# Patient Record
Sex: Female | Born: 1965 | Race: White | Hispanic: No | Marital: Single | Smoking: Current every day smoker
Health system: Southern US, Community
[De-identification: ages and names within clinical notes are randomized; demographics above are authoritative.]

## PROBLEM LIST (undated history)

## (undated) DIAGNOSIS — K219 Gastro-esophageal reflux disease without esophagitis: Secondary | ICD-10-CM

## (undated) DIAGNOSIS — R519 Headache, unspecified: Secondary | ICD-10-CM

## (undated) DIAGNOSIS — E669 Obesity, unspecified: Secondary | ICD-10-CM

## (undated) DIAGNOSIS — I2510 Atherosclerotic heart disease of native coronary artery without angina pectoris: Secondary | ICD-10-CM

## (undated) DIAGNOSIS — I10 Essential (primary) hypertension: Secondary | ICD-10-CM

## (undated) DIAGNOSIS — K2270 Barrett's esophagus without dysplasia: Secondary | ICD-10-CM

## (undated) DIAGNOSIS — R51 Headache: Secondary | ICD-10-CM

## (undated) HISTORY — DX: Obesity, unspecified: E66.9

## (undated) HISTORY — PX: COLONOSCOPY: SHX174

## (undated) HISTORY — PX: ATHERECTOMY: SHX47

## (undated) HISTORY — DX: Barrett's esophagus without dysplasia: K22.70

## (undated) HISTORY — PX: CARPAL TUNNEL RELEASE: SHX101

## (undated) HISTORY — PX: ESOPHAGOGASTRODUODENOSCOPY: SHX1529

## (undated) HISTORY — PX: TUBAL LIGATION: SHX77

## (undated) HISTORY — DX: Atherosclerotic heart disease of native coronary artery without angina pectoris: I25.10

---

## 2003-02-27 ENCOUNTER — Other Ambulatory Visit: Payer: Self-pay

## 2003-12-08 ENCOUNTER — Other Ambulatory Visit: Payer: Self-pay

## 2004-05-15 ENCOUNTER — Ambulatory Visit: Payer: Self-pay

## 2004-06-17 ENCOUNTER — Ambulatory Visit: Payer: Self-pay

## 2004-06-18 ENCOUNTER — Ambulatory Visit: Payer: Self-pay

## 2004-06-19 ENCOUNTER — Ambulatory Visit: Payer: Self-pay

## 2004-06-30 ENCOUNTER — Ambulatory Visit: Payer: Self-pay | Admitting: Specialist

## 2004-07-02 ENCOUNTER — Ambulatory Visit: Payer: Self-pay

## 2004-08-06 ENCOUNTER — Ambulatory Visit: Payer: Self-pay

## 2004-08-12 ENCOUNTER — Ambulatory Visit: Payer: Self-pay

## 2004-10-31 ENCOUNTER — Other Ambulatory Visit: Payer: Self-pay

## 2004-10-31 ENCOUNTER — Emergency Department: Payer: Self-pay | Admitting: Emergency Medicine

## 2004-12-29 ENCOUNTER — Ambulatory Visit: Payer: Self-pay

## 2004-12-30 ENCOUNTER — Ambulatory Visit: Payer: Self-pay | Admitting: Unknown Physician Specialty

## 2005-01-06 ENCOUNTER — Emergency Department: Payer: Self-pay | Admitting: Emergency Medicine

## 2005-01-13 ENCOUNTER — Ambulatory Visit: Payer: Self-pay | Admitting: Internal Medicine

## 2005-06-25 ENCOUNTER — Ambulatory Visit: Payer: Self-pay | Admitting: Neurology

## 2005-09-24 ENCOUNTER — Ambulatory Visit: Payer: Self-pay | Admitting: Unknown Physician Specialty

## 2006-02-03 ENCOUNTER — Ambulatory Visit: Payer: Self-pay | Admitting: Family Medicine

## 2006-03-09 ENCOUNTER — Ambulatory Visit: Payer: Self-pay | Admitting: Gastroenterology

## 2006-03-25 ENCOUNTER — Ambulatory Visit: Payer: Self-pay | Admitting: Gastroenterology

## 2007-04-12 ENCOUNTER — Ambulatory Visit: Payer: Self-pay | Admitting: Gastroenterology

## 2007-04-19 ENCOUNTER — Ambulatory Visit: Payer: Self-pay | Admitting: Family Medicine

## 2007-06-02 ENCOUNTER — Ambulatory Visit: Payer: Self-pay | Admitting: Family Medicine

## 2008-08-06 ENCOUNTER — Ambulatory Visit: Payer: Self-pay | Admitting: Family Medicine

## 2008-09-18 ENCOUNTER — Ambulatory Visit: Payer: Self-pay | Admitting: Nephrology

## 2009-05-15 ENCOUNTER — Ambulatory Visit: Payer: Self-pay | Admitting: Gastroenterology

## 2009-06-18 ENCOUNTER — Ambulatory Visit: Payer: Self-pay | Admitting: Family Medicine

## 2010-09-20 ENCOUNTER — Emergency Department: Payer: Self-pay | Admitting: Emergency Medicine

## 2010-09-26 ENCOUNTER — Ambulatory Visit: Payer: Self-pay | Admitting: Internal Medicine

## 2011-06-03 ENCOUNTER — Ambulatory Visit: Payer: Self-pay | Admitting: Neurology

## 2011-09-02 ENCOUNTER — Emergency Department: Payer: Self-pay | Admitting: *Deleted

## 2011-09-07 ENCOUNTER — Observation Stay: Payer: Self-pay | Admitting: Internal Medicine

## 2011-09-07 LAB — URINALYSIS, COMPLETE
Bacteria: NONE SEEN
Bilirubin,UR: NEGATIVE
Blood: NEGATIVE
Ketone: NEGATIVE
Leukocyte Esterase: NEGATIVE
Nitrite: NEGATIVE
Protein: NEGATIVE
RBC,UR: <1 /HPF (ref 0–5)
Specific Gravity: 1.019 (ref 1.003–1.030)
Squamous Epithelial: 1

## 2011-09-07 LAB — BASIC METABOLIC PANEL
Anion Gap: 7 (ref 7–16)
Calcium, Total: 8.2 mg/dL — ABNORMAL LOW (ref 8.5–10.1)
Chloride: 113 mmol/L — ABNORMAL HIGH (ref 98–107)
Co2: 23 mmol/L (ref 21–32)
EGFR (African American): >60
EGFR (Non-African Amer.): >60
Sodium: 143 mmol/L (ref 136–145)

## 2011-09-07 LAB — CBC
HGB: 14.5 g/dL (ref 12.0–16.0)
Platelet: 270 10*3/uL (ref 150–440)
RBC: 4.51 10*6/uL (ref 3.80–5.20)

## 2011-09-07 LAB — TROPONIN I: Troponin-I: <0.02 ng/mL

## 2011-09-08 LAB — LIPID PANEL
Cholesterol: 204 mg/dL — ABNORMAL HIGH (ref 0–200)
HDL Cholesterol: 23 mg/dL — ABNORMAL LOW (ref 40–60)

## 2011-10-01 ENCOUNTER — Emergency Department: Payer: Self-pay | Admitting: Emergency Medicine

## 2011-10-01 LAB — PROTIME-INR: Prothrombin Time: 12.9 secs (ref 11.5–14.7)

## 2011-10-01 LAB — URINALYSIS, COMPLETE
Bilirubin,UR: NEGATIVE
Blood: NEGATIVE
Glucose,UR: NEGATIVE mg/dL (ref 0–75)
Leukocyte Esterase: NEGATIVE
Ph: 6 (ref 4.5–8.0)
RBC,UR: NONE SEEN /HPF (ref 0–5)

## 2011-10-01 LAB — CBC
MCH: 31.5 pg (ref 26.0–34.0)
MCHC: 33.2 g/dL (ref 32.0–36.0)
MCV: 95 fL (ref 80–100)
Platelet: 357 10*3/uL (ref 150–440)

## 2011-10-01 LAB — COMPREHENSIVE METABOLIC PANEL
Albumin: 3.5 g/dL (ref 3.4–5.0)
Anion Gap: 11 (ref 7–16)
Calcium, Total: 8.6 mg/dL (ref 8.5–10.1)
Co2: 17 mmol/L — ABNORMAL LOW (ref 21–32)
Creatinine: 0.85 mg/dL (ref 0.60–1.30)
EGFR (Non-African Amer.): >60
Glucose: 87 mg/dL (ref 65–99)
Osmolality: 284 (ref 275–301)
Potassium: 3.9 mmol/L (ref 3.5–5.1)
SGOT(AST): 20 U/L (ref 15–37)
Sodium: 141 mmol/L (ref 136–145)

## 2011-10-01 LAB — APTT: Activated PTT: 29.7 secs (ref 23.6–35.9)

## 2011-10-01 LAB — TROPONIN I: Troponin-I: <0.02 ng/mL

## 2011-10-07 ENCOUNTER — Ambulatory Visit: Payer: Self-pay | Admitting: Neurology

## 2014-02-24 ENCOUNTER — Emergency Department: Payer: Self-pay | Admitting: Emergency Medicine

## 2014-02-24 LAB — COMPREHENSIVE METABOLIC PANEL
ALK PHOS: 72 U/L
Albumin: 3.2 g/dL — ABNORMAL LOW (ref 3.4–5.0)
Anion Gap: 4 — ABNORMAL LOW (ref 7–16)
BUN: 13 mg/dL (ref 7–18)
Bilirubin,Total: 0.1 mg/dL — ABNORMAL LOW (ref 0.2–1.0)
Calcium, Total: 7.7 mg/dL — ABNORMAL LOW (ref 8.5–10.1)
Chloride: 113 mmol/L — ABNORMAL HIGH (ref 98–107)
Co2: 24 mmol/L (ref 21–32)
Creatinine: 0.89 mg/dL (ref 0.60–1.30)
EGFR (Non-African Amer.): >60
GLUCOSE: 86 mg/dL (ref 65–99)
Osmolality: 281 (ref 275–301)
POTASSIUM: 3.9 mmol/L (ref 3.5–5.1)
SGOT(AST): 26 U/L (ref 15–37)
SGPT (ALT): 30 U/L
Sodium: 141 mmol/L (ref 136–145)
TOTAL PROTEIN: 6.4 g/dL (ref 6.4–8.2)

## 2014-02-24 LAB — CBC WITH DIFFERENTIAL/PLATELET
Basophil #: 0.1 10*3/uL (ref 0.0–0.1)
Basophil %: 1.2 %
Eosinophil #: 0.2 10*3/uL (ref 0.0–0.7)
Eosinophil %: 2 %
HCT: 41.9 % (ref 35.0–47.0)
HGB: 14.1 g/dL (ref 12.0–16.0)
Lymphocyte #: 3.8 10*3/uL — ABNORMAL HIGH (ref 1.0–3.6)
Lymphocyte %: 42.9 %
MCH: 31.5 pg (ref 26.0–34.0)
MCHC: 33.6 g/dL (ref 32.0–36.0)
MCV: 94 fL (ref 80–100)
MONO ABS: 0.8 x10 3/mm (ref 0.2–0.9)
Monocyte %: 8.6 %
Neutrophil #: 4 10*3/uL (ref 1.4–6.5)
Neutrophil %: 45.3 %
Platelet: 298 10*3/uL (ref 150–440)
RBC: 4.47 10*6/uL (ref 3.80–5.20)
RDW: 13.4 % (ref 11.5–14.5)
WBC: 8.8 10*3/uL (ref 3.6–11.0)

## 2014-02-24 LAB — TROPONIN I

## 2014-02-24 LAB — LIPASE, BLOOD: Lipase: 159 U/L (ref 73–393)

## 2014-02-25 LAB — URINALYSIS, COMPLETE
Bacteria: NONE SEEN
Bilirubin,UR: NEGATIVE
Blood: NEGATIVE
GLUCOSE, UR: NEGATIVE mg/dL (ref 0–75)
Ketone: NEGATIVE
Leukocyte Esterase: NEGATIVE
Nitrite: NEGATIVE
PH: 5 (ref 4.5–8.0)
Protein: NEGATIVE
SPECIFIC GRAVITY: 1.024 (ref 1.003–1.030)
Squamous Epithelial: 2
WBC UR: 2 /HPF (ref 0–5)

## 2014-04-08 ENCOUNTER — Emergency Department: Payer: Self-pay | Admitting: Emergency Medicine

## 2014-08-12 NOTE — Consult Note (Signed)
PATIENT NAME:  Robyn Anderson, Robyn Anderson MR#:  [id number] DATE OF BIRTH:  [birth year]  DATE OF CONSULTATION:  09/08/2011  REFERRING PHYSICIAN:  Dr. Demetrios Loll  CONSULTING PHYSICIAN:  Ninetta Adelstein K. Manuella Ghazi, MD  REASON FOR CONSULTATION: Episode of tremor, numbness, rule out stroke.   HISTORY OF PRESENT ILLNESS: Ms. Broxton is a 49 year old right-handed Caucasian female outpatient of mine with migraine headaches with recent status migrainosus.   She was having very severe headaches and I saw her as an outpatient, got started on topiramate.   She took around four pills and was feeling "bad", was having numbness around her mouth and on her fingers. The third morning when she woke up she noticed that her left hand was shaking, she could not come up with words and called 911, came to the ER and was occasionally having left hand tremors.   Her son has intractable epilepsy and they were worried about seizures.   She had mild elevation of her blood pressure of 139/95 per patient and she was worried about stroke.   Since then being off of medication her symptoms have resolved. Now she is not having any speech problems. She feels like her headache also improved.   Still not normal yet. She is still having some tingling and numbness in her fingers and around her mouth. She never lost consciousness during this period. She does not feel like she had a seizure.   PAST MEDICAL HISTORY:  1. Asthma. 2. Kidney stones.  3. Carpal tunnel. 4. Migraines.   PAST SURGICAL HISTORY:  1. Kidney surgery.  2. Tubal ligation.  3. Two C-sections.   SOCIAL HISTORY: Significant that she smokes a half pack per day since age of 61. She does not drink alcohol. Does not do recreational drugs. She works as a [employer]arry manager. Has a college education. She never had DUI. Does not do recreational drugs.   FAMILY HISTORY: Significant that sister has MS. Father has high blood pressure and elevated PSA. Mother has cancer. One brother has high blood  pressure and kidney stone. Son has intractable epilepsy. Daughter has angina.   REVIEW OF SYSTEMS: Positive for headache, tingling and numbness around her fingers and around her mouth, recent episode of tremors, recent episode of status migrainosus. Recent episode of sensitivity to light and noise but now she is feeling better. She also feels like her mood is significantly better. Other 10 system review of system was asked and was found to be negative.   PHYSICAL EXAMINATION:  VITAL SIGNS: Temperature 98.5, pulse 75, respiratory rate 18, blood pressure 104/64, pulse oximetry 100% on room air.   GENERAL: She is a middle-aged, Caucasian female lying in the bed here by herself.   LUNGS: Clear to auscultation.   HEART: S1, S2 heart sounds. Carotid exam did not reveal any bruit.   Her funduscopic exam was unremarkable.    MENTAL STATUS: She was alert, oriented, could follow two-step inverted commands. Her face was symmetric. Tongue was midline. Facial sensations were intact. Her hearing was intact. Her shoulder shrug was normal.   On her motor exam she had normal tone and strength of 5/5 in all extremities.   Her sensory exam was intact to light touch. Her coordination was symmetric. Her reflexes were symmetric. Her gait was not checked.   LABORATORY, DIAGNOSTIC AND RADIOLOGICAL DATA: Her brain scan looked okay.   ASSESSMENT AND PLAN: Acute episode of left hand tremors, confusion, tingling and numbness soon after starting topiramate. Can be potentially side effect  with word finding difficulty, etc., but tremor will be very unusual being topiramate side effect. I cannot rule out behavior spell as she is going through rough time with severe of migraines.   For now I will hold off on starting any other new prophylactic medication as she recently had this problem with topiramate.   She can use muscle relaxant medication as rescue. She can also use NSAIDs but patient is aware of not going through  medication rebound headache.   Patient was explained on other medication side effects. I will hold off on giving her triptan at present as well.   I will see her back in the clinic as scheduled but she will be more than welcome to come and see me earlier if she has any further issues. Please feel free to contact me with any further questions.  ____________________________ Royetta Crochet. Manuella Ghazi, MD hks:cms D: 09/08/2011 19:46:36 ET T: 09/09/2011 09:35:06 ET JOB#: [id number]  cc: Dev Dhondt K. Manuella Ghazi, MD, <Dictator> Royetta Crochet [facility] MD ELECTRONICALLY SIGNED 09/11/2011 8:05

## 2014-08-12 NOTE — H&P (Signed)
PATIENT NAME:  Robyn Anderson, Robyn Anderson MR#:  [id number] DATE OF BIRTH:  October 04, 1965  DATE OF ADMISSION:  09/07/2011  CHIEF COMPLAINT: Left hand numbness, tremor, and shaking in addition to bilateral extremity tingling for three days, worsening today.   HISTORY OF PRESENT ILLNESS: The patient is a 49 year old Caucasian female with a history of migraine, asthma, and Barrett's esophagus who presented to the ED with the above chief complaint. The patient is alert, awake, oriented in no acute distress. She complains of left hand numbness, tremor, and shaking in bilateral extremities, tingling for about three days with the symptoms worsening since this morning so she came to the ED for further evaluation. According to the patient, she also has slurred speech but according to her daughter slurred speech is the patient's baseline. The patient denies any headache, dizziness, or weakness. No incontinence. The patient said she was started on Topamax recently by Dr. Manuella Ghazi. According to ED physician, Dr. Reita Cliche, she discussed with Dr. Manuella Ghazi. Dr. Manuella Ghazi suggested that it is not side effect for Topamax but the patient needs to be admitted for further work-up.   SOCIAL HISTORY: The patient smokes half a pack a day since 49 years old. Denies any alcohol drinking or illicit drugs.   FAMILY HISTORY: Mother had colon cancer, hypertension, and diabetes. Stroke runs in her family.   PAST SURGICAL HISTORY: Cesarean section twice.   ALLERGIES: Aspirin and Vicodin.   HOME MEDICATIONS:  1. Advair 100/50 one puff b.i.d.  2. Combivent 1 puff 4 times daily p.r.n.  3. Topamax 25 mg p.o. twice daily.  4. Zyrtec once daily.   REVIEW OF SYSTEMS: CONSTITUTIONAL: The patient denies any fever or chills. No headache, dizziness, or weight loss. HEENT: No double vision or blurred vision. No dysphagia but has slurred speech. No postnasal drip or epistaxis. RESPIRATORY: No cough, sputum, shortness of breath, or hemoptysis. GI: No abdominal pain,  nausea, vomiting, or diarrhea. No melena. CARDIOVASCULAR: No chest pain, palpitation, orthopnea, or nocturnal dyspnea. No leg edema. GU: No dysuria, hematuria, or incontinence. SKIN: No rash or jaundice. ENDOCRINE: No polydipsia, polyuria, or nocturia. HEMATOLOGY: No easy bruising or bleeding. NEUROLOGY: Positive for numbness, tingling, and tremor of extremities but no loss of consciousness, seizure, or syncope.  PHYSICAL EXAMINATION:  VITAL SIGNS: Temperature 97.9, blood pressure 139/59, pulse 79, respirations 18, oxygen saturation 100% on room air.   GENERAL: The patient is alert, awake, oriented in no acute distress.   HEENT: Pupils are round, equal, and reactive to light and accommodation. Moist oral mucosa. Clear oropharynx.   NECK: Supple. No JVD or carotid bruit. No lymphadenopathy.   CARDIOVASCULAR: S1, S2 regular rate and rhythm. No murmurs or gallops.   PULMONARY: Bilateral air entry. No wheezing or rales.   ABDOMEN: Soft. No distention or tenderness. No organomegaly. Bowel sounds are present.   EXTREMITIES: No edema, clubbing, or cyanosis. No calf tenderness. Bilateral strong pedal pulses.   NEUROLOGY: Alert and oriented x3. No focal deficits. Has mild slurred speech. Power 5 out of 5. Sensation intact. Deep tendon reflexes 2+.   LABORATORY, DIAGNOSTIC, AND RADIOLOGICAL DATA: CBC normal glucose of 85, BUN 12, creatinine 0.97, potassium 4.1, sodium 143, chloride 113, bicarb 23.   CAT scan of head no acute intracranial abnormality.   EKG showed normal sinus rhythm at 74 beats per minute.    IMPRESSION:  1. Extremity numbness, tingling, and tremor, possibly due to side effect of medicine, Topamax or Zyrtec but need to rule out cerebrovascular accident.  2.  Migraine.  3. Asthma.  4. Barrett's esophagus.   PLAN OF TREATMENT:  1. The patient will be placed for observation.  2. Will continue telemonitor.  3. Get MRI of the brain and echocardiograph of carotid duplex.   4. Will check lipid panel and start Lipitor. 5. Since the patient has allergy to aspirin, will give Plavix but discontinue Topamax and Zyrtec.   6. Asthma. Continue Advair and Combivent p.r.n.  7. GI and DVT prophylaxis.   Discussed the patient's situation and the plan of work-up and treatment with the patient and the patient's son and daughter.   TIME SPENT: About 60 minutes.  ____________________________ Demetrios Loll, MD qc:drc D: 09/07/2011 14:09:09 ET T: 09/07/2011 14:32:58 ET JOB#: [id number]  cc: Demetrios Loll, MD, <Dictator> Demetrios Loll MD ELECTRONICALLY SIGNED 09/08/2011 13:39

## 2015-09-24 ENCOUNTER — Encounter: Payer: Self-pay | Admitting: *Deleted

## 2015-09-27 ENCOUNTER — Ambulatory Visit
Admission: RE | Admit: 2015-09-27 | Discharge: 2015-09-27 | Disposition: A | Discharge status: Home / Self Care | Payer: 59 | Source: Ambulatory Visit | Attending: Unknown Physician Specialty | Admitting: Unknown Physician Specialty

## 2015-09-27 ENCOUNTER — Ambulatory Visit: Payer: 59 | Admitting: Anesthesiology

## 2015-09-27 ENCOUNTER — Encounter
Admission: RE | Disposition: A | Discharge status: Home / Self Care | Payer: Self-pay | Source: Ambulatory Visit | Attending: Unknown Physician Specialty

## 2015-09-27 DIAGNOSIS — Z888 Allergy status to other drugs, medicaments and biological substances status: Secondary | ICD-10-CM | POA: Diagnosis not present

## 2015-09-27 DIAGNOSIS — Z800 Family history of malignant neoplasm of digestive organs: Secondary | ICD-10-CM | POA: Insufficient documentation

## 2015-09-27 DIAGNOSIS — F17200 Nicotine dependence, unspecified, uncomplicated: Secondary | ICD-10-CM | POA: Diagnosis not present

## 2015-09-27 DIAGNOSIS — Z841 Family history of disorders of kidney and ureter: Secondary | ICD-10-CM | POA: Diagnosis not present

## 2015-09-27 DIAGNOSIS — Z885 Allergy status to narcotic agent status: Secondary | ICD-10-CM | POA: Insufficient documentation

## 2015-09-27 DIAGNOSIS — G43909 Migraine, unspecified, not intractable, without status migrainosus: Secondary | ICD-10-CM | POA: Insufficient documentation

## 2015-09-27 DIAGNOSIS — Z820 Family history of epilepsy and other diseases of the nervous system: Secondary | ICD-10-CM | POA: Diagnosis not present

## 2015-09-27 DIAGNOSIS — Z79899 Other long term (current) drug therapy: Secondary | ICD-10-CM | POA: Diagnosis not present

## 2015-09-27 DIAGNOSIS — G5601 Carpal tunnel syndrome, right upper limb: Secondary | ICD-10-CM | POA: Diagnosis present

## 2015-09-27 DIAGNOSIS — Z8042 Family history of malignant neoplasm of prostate: Secondary | ICD-10-CM | POA: Insufficient documentation

## 2015-09-27 DIAGNOSIS — Z8249 Family history of ischemic heart disease and other diseases of the circulatory system: Secondary | ICD-10-CM | POA: Diagnosis not present

## 2015-09-27 HISTORY — DX: Headache: R51

## 2015-09-27 HISTORY — DX: Gastro-esophageal reflux disease without esophagitis: K21.9

## 2015-09-27 HISTORY — DX: Essential (primary) hypertension: I10

## 2015-09-27 HISTORY — DX: Headache, unspecified: R51.9

## 2015-09-27 HISTORY — PX: CARPAL TUNNEL RELEASE: SHX101

## 2015-09-27 SURGERY — CARPAL TUNNEL RELEASE
Anesthesia: General | Site: Hand | Laterality: Right | Wound class: Clean

## 2015-09-27 MED ORDER — ONDANSETRON HCL 4 MG/2ML IJ SOLN
INTRAMUSCULAR | Status: DC | PRN
  Administered 2015-09-27: 4 mg via INTRAVENOUS

## 2015-09-27 MED ORDER — PROPOFOL 10 MG/ML IV BOLUS
INTRAVENOUS | Status: DC | PRN
  Administered 2015-09-27: 140 mg via INTRAVENOUS

## 2015-09-27 MED ORDER — LIDOCAINE HCL (CARDIAC) 20 MG/ML IV SOLN
INTRAVENOUS | Status: DC | PRN
  Administered 2015-09-27: 40 mg via INTRATRACHEAL

## 2015-09-27 MED ORDER — BUPIVACAINE HCL (PF) 0.5 % IJ SOLN
INTRAMUSCULAR | Status: DC | PRN
  Administered 2015-09-27: 7 mL

## 2015-09-27 MED ORDER — LACTATED RINGERS IV SOLN
INTRAVENOUS | Status: DC
  Administered 2015-09-27: 09:00:00 via INTRAVENOUS

## 2015-09-27 MED ORDER — KETOROLAC TROMETHAMINE 30 MG/ML IJ SOLN
INTRAMUSCULAR | Status: DC | PRN
  Administered 2015-09-27: 30 mg via INTRAVENOUS

## 2015-09-27 MED ORDER — GLYCOPYRROLATE 0.2 MG/ML IJ SOLN
INTRAMUSCULAR | Status: DC | PRN
  Administered 2015-09-27: 0.2 mg via INTRAVENOUS

## 2015-09-27 MED ORDER — MIDAZOLAM HCL 5 MG/5ML IJ SOLN
INTRAMUSCULAR | Status: DC | PRN
  Administered 2015-09-27: 2 mg via INTRAVENOUS

## 2015-09-27 MED ORDER — DEXAMETHASONE SODIUM PHOSPHATE 4 MG/ML IJ SOLN
INTRAMUSCULAR | Status: DC | PRN
  Administered 2015-09-27: 4 mg via INTRAVENOUS

## 2015-09-27 MED ORDER — FENTANYL CITRATE (PF) 100 MCG/2ML IJ SOLN
INTRAMUSCULAR | Status: DC | PRN
  Administered 2015-09-27 (×2): 50 ug via INTRAVENOUS

## 2015-09-27 SURGICAL SUPPLY — 28 items
BANDAGE ELASTIC 2 CLIP NS LF (GAUZE/BANDAGES/DRESSINGS) ×3 IMPLANT
BNDG ESMARK 4X12 TAN STRL LF (GAUZE/BANDAGES/DRESSINGS) ×3 IMPLANT
BNDG STRETCH 4X75 STRL LF (GAUZE/BANDAGES/DRESSINGS) ×3 IMPLANT
COVER LIGHT HANDLE FLEXIBLE (MISCELLANEOUS) ×3 IMPLANT
CUFF TOURN SGL QUICK 18 (TOURNIQUET CUFF) ×3 IMPLANT
DURAPREP 26ML APPLICATOR (WOUND CARE) ×3 IMPLANT
GAUZE SPONGE 4X4 12PLY STRL (GAUZE/BANDAGES/DRESSINGS) ×3 IMPLANT
GLOVE BIO SURGEON STRL SZ7.5 (GLOVE) ×3 IMPLANT
GLOVE BIO SURGEON STRL SZ8 (GLOVE) ×6 IMPLANT
GLOVE INDICATOR 8.0 STRL GRN (GLOVE) ×3 IMPLANT
GOWN STRL REUS W/ TWL LRG LVL3 (GOWN DISPOSABLE) ×2 IMPLANT
GOWN STRL REUS W/TWL LRG LVL3 (GOWN DISPOSABLE) ×4
KIT ROOM TURNOVER OR (KITS) ×3 IMPLANT
LOOP VESSEL RED MINI 1.3X0.9 (MISCELLANEOUS) ×1 IMPLANT
LOOPS RED MINI 1.3MMX0.9MM (MISCELLANEOUS) ×2
NS IRRIG 500ML POUR BTL (IV SOLUTION) ×3 IMPLANT
PACK EXTREMITY ARMC (MISCELLANEOUS) ×3 IMPLANT
PAD GROUND ADULT SPLIT (MISCELLANEOUS) ×3 IMPLANT
PADDING CAST 2X4YD ST (MISCELLANEOUS) ×2
PADDING CAST BLEND 2X4 STRL (MISCELLANEOUS) ×1 IMPLANT
SOL PREP PVP 2OZ (MISCELLANEOUS) ×3
SOLUTION PREP PVP 2OZ (MISCELLANEOUS) ×1 IMPLANT
SPLINT CAST 1 STEP 3X12 (MISCELLANEOUS) ×3 IMPLANT
STOCKINETTE 4X48 STRL (DRAPES) ×3 IMPLANT
STRAP BODY AND KNEE 60X3 (MISCELLANEOUS) ×3 IMPLANT
SUT ETHILON 4-0 (SUTURE) ×2
SUT ETHILON 4-0 FS2 18XMFL BLK (SUTURE) ×1
SUTURE ETHLN 4-0 FS2 18XMF BLK (SUTURE) ×1 IMPLANT

## 2015-09-27 NOTE — Anesthesia Procedure Notes (Signed)
Procedure Name: LMA Insertion Performed by: Georga Bora Pre-anesthesia Checklist: Patient identified, Emergency Drugs available, Suction available, Timeout performed and Patient being monitored Patient Re-evaluated:Patient Re-evaluated prior to inductionOxygen Delivery Method: Circle system utilized Preoxygenation: Pre-oxygenation with 100% oxygen Intubation Type: IV induction LMA: LMA inserted LMA Size: 4.0 Number of attempts: 1 Placement Confirmation: positive ETCO2 and breath sounds checked- equal and bilateral Tube secured with: Tape

## 2015-09-27 NOTE — Op Note (Signed)
DATE OF SURGERY:  09/27/2015  PATIENT NAME:  Robyn Anderson   DOB: December 24, 1965  MRN: [id number]  PRE-OPERATIVE DIAGNOSIS: Right carpal tunnel syndrome  POST-OPERATIVE DIAGNOSIS:  Same  PROCEDURE: Right carpal tunnel release  SURGEON: Dr. Leanor Kail, Brooke Bonito. M.D.  ANESTHESIA: Gen.   INDICATIONS FOR SURGERY: Robyn Anderson is a 50 y.o. year old female with a long history of numbness and paresthesias in the right hand. Nerve conduction studies demonstrated findings consistent with moderately severe  median nerve compression.The patient had not seen any significant improvement despite conservative nonsurgical intervention. After discussion of the risks and benefits of surgical intervention, the patient expressed understanding of the risks benefits and agreed with plans for carpal tunnel release.   PROCEDURE IN DETAIL: The patient was taken the operating room where satisfactory general anesthesia was achieved. A tourniquet was placed on the patient's right upper arm.The right hand and arm were prepped  and draped in the usual sterile fashion. A "time-out" was performed as per usual protocol. The hand and forearm were exsanguinated using an Esmarch and the tourniquet was inflated to 250 mmHg.  An incision was made just ulnar to the thenar palmar crease. Dissection was carried down through the palmar fascia to the transverse carpal ligament. The transverse carpal ligament was sharply incised, taking care to protect the underlying structures within the carpal tunnel. Complete release of the transverse carpal ligament was achieved. There was no evidence of a mass or proliferative synovitis within the carpal tunnel. The median nerve underneath the divided ligament was somewhat flattened. The wound was irrigated with saline. The tourniquet was released at this time. It had been up for about 12 minutes. Bleeding was controlled with digital pressure and coagulation cautery. I did inject the subcutaneous tissue of  the wound with about 5 cc of 0.5% Marcaine without epinephrine. The skin was then re-approximated with interrupted sutures of #4-0 nylon. A sterile dressing was applied followed by application of a volar splint.  The patient was awakened and transferred to her stretcher bed.  The patient tolerated the procedure well and was transported to the PACU in stable condition. Blood loss was negligible.  Dr. Mariel Kansky. M.D.

## 2015-09-27 NOTE — Anesthesia Postprocedure Evaluation (Signed)
Anesthesia Post Note  Patient: Robyn Anderson  Procedure(s) Performed: Procedure(s) (LRB): OPEN CARPAL TUNNEL RELEASE RIGHT HAND (Right)  Patient location during evaluation: PACU Anesthesia Type: General Level of consciousness: awake and alert Pain management: pain level controlled Vital Signs Assessment: post-procedure vital signs reviewed and stable Respiratory status: spontaneous breathing and respiratory function stable Cardiovascular status: stable Postop Assessment: no headache Anesthetic complications: no    Jaci Standard, III,  Banks Chaikin D

## 2015-09-27 NOTE — Transfer of Care (Signed)
Immediate Anesthesia Transfer of Care Note  Patient: Robyn Anderson  Procedure(s) Performed: Procedure(s): OPEN CARPAL TUNNEL RELEASE RIGHT HAND (Right)  Patient Location: PACU  Anesthesia Type: General LMA  Level of Consciousness: awake, alert  and patient cooperative  Airway and Oxygen Therapy: Patient Spontanous Breathing and Patient connected to supplemental oxygen  Post-op Assessment: Post-op Vital signs reviewed, Patient's Cardiovascular Status Stable, Respiratory Function Stable, Patent Airway and No signs of Nausea or vomiting  Post-op Vital Signs: Reviewed and stable  Complications: No apparent anesthesia complications

## 2015-09-27 NOTE — Anesthesia Preprocedure Evaluation (Signed)
Anesthesia Evaluation  Patient identified by MRN, date of birth, ID band Patient awake    Reviewed: Allergy & Precautions, H&P , NPO status , Patient's Chart, lab work & pertinent test results  Airway Mallampati: II   Neck ROM: full  Mouth opening: Limited Mouth Opening  Dental no notable dental hx.    Pulmonary Current Smoker,    Pulmonary exam normal        Cardiovascular hypertension, On Medications Normal cardiovascular exam     Neuro/Psych    GI/Hepatic Neg liver ROS, Medicated,  Endo/Other  negative endocrine ROS  Renal/GU negative Renal ROS     Musculoskeletal   Abdominal   Peds  Hematology negative hematology ROS (+)   Anesthesia Other Findings   Reproductive/Obstetrics                             Anesthesia Physical Anesthesia Plan  ASA: II  Anesthesia Plan: General LMA   Post-op Pain Management:    Induction:   Airway Management Planned:   Additional Equipment:   Intra-op Plan:   Post-operative Plan:   Informed Consent: I have reviewed the patients History and Physical, chart, labs and discussed the procedure including the risks, benefits and alternatives for the proposed anesthesia with the patient or authorized representative who has indicated his/her understanding and acceptance.     Plan Discussed with: CRNA  Anesthesia Plan Comments:         Anesthesia Quick Evaluation

## 2015-09-27 NOTE — Discharge Instructions (Signed)
General Anesthesia, Adult, Care After Refer to this sheet in the next few weeks. These instructions provide you with information on caring for yourself after your procedure. Your health care provider may also give you more specific instructions. Your treatment has been planned according to current medical practices, but problems sometimes occur. Call your health care provider if you have any problems or questions after your procedure. WHAT TO EXPECT AFTER THE PROCEDURE After the procedure, it is typical to experience:  Sleepiness.  Nausea and vomiting. HOME CARE INSTRUCTIONS  For the first 24 hours after general anesthesia:  Have a responsible person with you.  Do not drive a car. If you are alone, do not take public transportation.  Do not drink alcohol.  Do not take medicine that has not been prescribed by your health care provider.  Do not sign important papers or make important decisions.  You may resume a normal diet and activities as directed by your health care provider.  Change bandages (dressings) as directed.  If you have questions or problems that seem related to general anesthesia, call the hospital and ask for the anesthetist or anesthesiologist on call. SEEK MEDICAL CARE IF:  You have nausea and vomiting that continue the day after anesthesia.  You develop a rash. SEEK IMMEDIATE MEDICAL CARE IF:   You have difficulty breathing.  You have chest pain.  You have any allergic problems.   This information is not intended to replace advice given to you by your health care provider. Make sure you discuss any questions you have with your health care provider.   Document Released: 07/13/2000 Document Revised: 04/27/2014 Document Reviewed: 08/05/2011 Elsevier Interactive Patient Education 2016 [employer]   Elevation  Ice pack  RTC in about 10 days  Keep dressing dry

## 2015-09-27 NOTE — H&P (Signed)
  H and P reviewed. No changes. Uploaded at later date. 

## 2015-09-30 ENCOUNTER — Encounter: Payer: Self-pay | Admitting: Unknown Physician Specialty

## 2016-09-07 ENCOUNTER — Emergency Department (HOSPITAL_COMMUNITY)
Admission: EM | Admit: 2016-09-07 | Discharge: 2016-09-07 | Disposition: A | Discharge status: Home / Self Care | Payer: 59 | Attending: Emergency Medicine | Admitting: Emergency Medicine

## 2016-09-07 ENCOUNTER — Emergency Department (HOSPITAL_COMMUNITY): Payer: 59

## 2016-09-07 ENCOUNTER — Encounter (HOSPITAL_COMMUNITY): Payer: Self-pay | Admitting: Emergency Medicine

## 2016-09-07 DIAGNOSIS — Z7982 Long term (current) use of aspirin: Secondary | ICD-10-CM | POA: Diagnosis not present

## 2016-09-07 DIAGNOSIS — Z79899 Other long term (current) drug therapy: Secondary | ICD-10-CM | POA: Diagnosis not present

## 2016-09-07 DIAGNOSIS — K529 Noninfective gastroenteritis and colitis, unspecified: Secondary | ICD-10-CM

## 2016-09-07 DIAGNOSIS — F17210 Nicotine dependence, cigarettes, uncomplicated: Secondary | ICD-10-CM | POA: Diagnosis not present

## 2016-09-07 DIAGNOSIS — I10 Essential (primary) hypertension: Secondary | ICD-10-CM | POA: Diagnosis not present

## 2016-09-07 DIAGNOSIS — K659 Peritonitis, unspecified: Secondary | ICD-10-CM | POA: Insufficient documentation

## 2016-09-07 DIAGNOSIS — R197 Diarrhea, unspecified: Secondary | ICD-10-CM | POA: Diagnosis present

## 2016-09-07 DIAGNOSIS — N83202 Unspecified ovarian cyst, left side: Secondary | ICD-10-CM | POA: Diagnosis not present

## 2016-09-07 DIAGNOSIS — K6389 Other specified diseases of intestine: Secondary | ICD-10-CM

## 2016-09-07 LAB — CBC WITH DIFFERENTIAL/PLATELET
BASOS PCT: 1 %
Basophils Absolute: 0.1 10*3/uL (ref 0.0–0.1)
EOS ABS: 0.1 10*3/uL (ref 0.0–0.7)
Eosinophils Relative: 1 %
HCT: 44.5 % (ref 36.0–46.0)
HEMOGLOBIN: 14.9 g/dL (ref 12.0–15.0)
Lymphocytes Relative: 37 %
Lymphs Abs: 3.7 10*3/uL (ref 0.7–4.0)
MCH: 31.4 pg (ref 26.0–34.0)
MCHC: 33.5 g/dL (ref 30.0–36.0)
MCV: 93.7 fL (ref 78.0–100.0)
Monocytes Absolute: 0.5 10*3/uL (ref 0.1–1.0)
Monocytes Relative: 5 %
NEUTROS PCT: 56 %
Neutro Abs: 5.6 10*3/uL (ref 1.7–7.7)
Platelets: 317 10*3/uL (ref 150–400)
RBC: 4.75 MIL/uL (ref 3.87–5.11)
RDW: 13.6 % (ref 11.5–15.5)
WBC: 9.9 10*3/uL (ref 4.0–10.5)

## 2016-09-07 LAB — COMPREHENSIVE METABOLIC PANEL
ALT: 18 U/L (ref 14–54)
ANION GAP: 8 (ref 5–15)
AST: 21 U/L (ref 15–41)
Albumin: 3.8 g/dL (ref 3.5–5.0)
Alkaline Phosphatase: 64 U/L (ref 38–126)
BUN: 11 mg/dL (ref 6–20)
CALCIUM: 9.3 mg/dL (ref 8.9–10.3)
CHLORIDE: 109 mmol/L (ref 101–111)
CO2: 22 mmol/L (ref 22–32)
Creatinine, Ser: 0.8 mg/dL (ref 0.44–1.00)
GFR calc non Af Amer: >60 mL/min (ref >60)
Glucose, Bld: 82 mg/dL (ref 65–99)
Potassium: 3.6 mmol/L (ref 3.5–5.1)
SODIUM: 139 mmol/L (ref 135–145)
Total Bilirubin: 0.6 mg/dL (ref 0.3–1.2)
Total Protein: 7.2 g/dL (ref 6.5–8.1)

## 2016-09-07 LAB — URINALYSIS, ROUTINE W REFLEX MICROSCOPIC
BILIRUBIN URINE: NEGATIVE
Glucose, UA: NEGATIVE mg/dL
Hgb urine dipstick: NEGATIVE
Ketones, ur: NEGATIVE mg/dL
Leukocytes, UA: NEGATIVE
NITRITE: NEGATIVE
Protein, ur: NEGATIVE mg/dL
SPECIFIC GRAVITY, URINE: 1.005 (ref 1.005–1.030)
pH: 5 (ref 5.0–8.0)

## 2016-09-07 LAB — PREGNANCY, URINE: Preg Test, Ur: NEGATIVE

## 2016-09-07 MED ORDER — MORPHINE SULFATE (PF) 4 MG/ML IV SOLN
4.0000 mg | Freq: Once | INTRAVENOUS | Status: AC
  Administered 2016-09-07: 4 mg via INTRAVENOUS
  Filled 2016-09-07: qty 1

## 2016-09-07 MED ORDER — IOPAMIDOL (ISOVUE-300) INJECTION 61%
INTRAVENOUS | Status: AC
  Administered 2016-09-07: 100 mL
  Filled 2016-09-07: qty 100

## 2016-09-07 MED ORDER — SODIUM CHLORIDE 0.9 % IV SOLN
Freq: Once | INTRAVENOUS | Status: AC
  Administered 2016-09-07: 21:00:00 via INTRAVENOUS

## 2016-09-07 MED ORDER — ONDANSETRON HCL 4 MG/2ML IJ SOLN
4.0000 mg | Freq: Once | INTRAMUSCULAR | Status: AC
  Administered 2016-09-07: 4 mg via INTRAVENOUS
  Filled 2016-09-07: qty 2

## 2016-09-07 MED ORDER — TRAMADOL HCL 50 MG PO TABS: 50.0000 mg | ORAL_TABLET | Freq: Four times a day (QID) | ORAL | 0 refills | Status: DC | PRN

## 2016-09-07 NOTE — ED Triage Notes (Signed)
Patient came up to nurses station and handed phone to RN, pt's MD at [facility] was on phone and wanted to pass on message that the patient's exam was not finished because she recommended she go straight to ED for further evaluation for possible diverticulitis. RN put message in chart - pt had already been roomed and states she will let MD know.

## 2016-09-07 NOTE — ED Provider Notes (Signed)
[facility] DEPT Provider Note   CSN: [id number] Arrival date & time: 09/07/16  1503     History   Chief Complaint Chief Complaint  Patient presents with  . Abdominal Pain  . Diarrhea    HPI NUVIA HILEMAN is a 51 y.o. female.  HPI DASHAY GIESLER is a 51 y.o. female with history of hypertension, acid reflux, diverticulosis, migraines, presents to emergency department complaining of abdominal pain. Patient states that she developed sudden onset of left lower quadrant abdominal pain yesterday. She states this morning pain was worse. She went to urgent care was sent here for CT scan. Patient does have history of diverticulosis on a prior colonoscopy, and denies history of diverticulitis. Patient has been taking BC powder for pain which is not helping. Patient also reports 2 months of a diarrhea, describes as brown, watery. She states diarrhea started after her primary care doctor switched some of her migraine medications. She denies any recent antibiotic courses, denies any travel. Denies any blood in her stool. Denies any nausea or vomiting. No vaginal discharge or bleeding. No urinary symptoms. Has history of kidney stones this does not feel the same.     Past Medical History:  Diagnosis Date  . GERD (gastroesophageal reflux disease)   . Headache    migraines 1-2/mo  . Hypertension     There are no active problems to display for this patient.   Past Surgical History:  Procedure Laterality Date  . ATHERECTOMY Right    renal  . CARPAL TUNNEL RELEASE Left   . CARPAL TUNNEL RELEASE Right 09/27/2015   Procedure: OPEN CARPAL TUNNEL RELEASE RIGHT HAND;  Surgeon: Leanor Kail, MD;  Location: [facility];  Service: Orthopedics;  Laterality: Right;  . CESAREAN SECTION     x2  . COLONOSCOPY    . ESOPHAGOGASTRODUODENOSCOPY    . TUBAL LIGATION      OB History    No data available       Home Medications    Prior to Admission medications   Medication Sig Start  Date End Date Taking? Authorizing Provider  amitriptyline (ELAVIL) 25 MG tablet Take 25 mg by mouth at bedtime.    [provider]  aspirin-acetaminophen-caffeine (EXCEDRIN MIGRAINE) [phone] MG tablet Take by mouth daily as needed for headache.    [provider]  aspirin-sod bicarb-citric acid (ALKA-SELTZER) 325 MG TBEF tablet Take 325 mg by mouth every 6 (six) hours as needed.    [provider]  hydrochlorothiazide (HYDRODIURIL) 25 MG tablet Take 25 mg by mouth daily.    [provider]  meloxicam (MOBIC) 7.5 MG tablet Take 7.5 mg by mouth 2 (two) times daily.    [provider]    Family History No family history on file.  Social History Social History  Substance Use Topics  . Smoking status: Current Every Day Smoker    Packs/day: 0.50    Years: 30.00    Types: Cigarettes  . Smokeless tobacco: Never Used  . Alcohol use No     Allergies   Topamax [topiramate] and Vicodin [hydrocodone-acetaminophen]   Review of Systems Review of Systems  Constitutional: Negative for chills and fever.  Respiratory: Negative for cough, chest tightness and shortness of breath.   Cardiovascular: Negative for chest pain, palpitations and leg swelling.  Gastrointestinal: Positive for abdominal pain and diarrhea. Negative for blood in stool, nausea and vomiting.  Genitourinary: Negative for dysuria, flank pain, pelvic pain, vaginal bleeding, vaginal discharge and vaginal pain.  Musculoskeletal: Negative for arthralgias, myalgias, neck pain and neck stiffness.  Skin: Negative for rash.  Neurological: Negative for dizziness, weakness and headaches.  All other systems reviewed and are negative.    Physical Exam Updated Vital Signs BP 137/74   Pulse 82   Temp 99.2 F (37.3 C) (Oral)   Resp 18   Ht 4\' 10"  (1.473 m)   Wt 67.1 kg (148 lb)   LMP 08/31/2016 (Exact Date)   SpO2 100%   BMI 30.93 kg/m   Physical Exam  Constitutional: She is  oriented to person, place, and time. She appears well-developed and well-nourished. No distress.  HENT:  Head: Normocephalic.  Eyes: Conjunctivae are normal.  Neck: Neck supple.  Cardiovascular: Normal rate, regular rhythm and normal heart sounds.   Pulmonary/Chest: Effort normal and breath sounds normal. No respiratory distress. She has no wheezes. She has no rales.  Abdominal: Soft. Bowel sounds are normal. She exhibits no distension. There is tenderness. There is no rebound.  LLQ tenderness, no guarding, no rebound tenderness  Musculoskeletal: She exhibits no edema.  Neurological: She is alert and oriented to person, place, and time.  Skin: Skin is warm and dry.  Psychiatric: She has a normal mood and affect. Her behavior is normal.  Nursing note and vitals reviewed.    ED Treatments / Results  Labs (all labs ordered are listed, but only abnormal results are displayed) Labs Reviewed  URINALYSIS, ROUTINE W REFLEX MICROSCOPIC - Abnormal; Notable for the following:       Result Value   Color, Urine STRAW (*)    All other components within normal limits  CBC WITH DIFFERENTIAL/PLATELET  COMPREHENSIVE METABOLIC PANEL  PREGNANCY, URINE    EKG  EKG Interpretation None       Radiology Ct Abdomen Pelvis W Contrast  Result Date: 09/07/2016 CLINICAL DATA:  Left lower quadrant pain starting today at 4:30 p.m. Diarrhea for 2 months. EXAM: CT ABDOMEN AND PELVIS WITH CONTRAST TECHNIQUE: Multidetector CT imaging of the abdomen and pelvis was performed using the standard protocol following bolus administration of intravenous contrast. CONTRAST:  167mL ISOVUE-300 IOPAMIDOL (ISOVUE-300) INJECTION 61% COMPARISON:  None. FINDINGS: Lower chest: Mild dependent changes in the lung bases. Hepatobiliary: Few scattered low-attenuation lesions throughout the liver, measuring less than 1 cm in diameter. These are too small to characterize but probably represent small cysts or hemangiomas. No other focal  lesions identified. Gallbladder and bile ducts are unremarkable. Pancreas: Unremarkable. No pancreatic ductal dilatation or surrounding inflammatory changes. Spleen: Normal in size without focal abnormality. Adrenals/Urinary Tract: Adrenal glands are unremarkable. Kidneys are normal, without renal calculi, focal lesion, or hydronephrosis. Bladder is unremarkable. Stomach/Bowel: Stomach, small bowel, and colon are not abnormally distended. Scattered fluid in the colon consistent with history of diarrhea. There is inflammatory infiltration around the low descending colon suggesting an inflamed fat lobule, consistent with epiploic appendagitis. No colonic wall thickening appreciated. The appendix is normal. Vascular/Lymphatic: No significant vascular findings are present. No enlarged abdominal or pelvic lymph nodes. Reproductive: Uterus is anteverted without enlargement. Benign-appearing cyst on the left ovary measures 3.6 cm diameter, likely functional. Right ovary is not enlarged. Other: No free air or free fluid in the abdomen. Abdominal wall musculature appears intact. Musculoskeletal: No acute or significant osseous findings. IMPRESSION: *Inflamed fat around the low descending colon consistent with epiploic appendagitis. *Subcentimeter liver lesions are too small to characterize but likely represent cysts or hemangiomas. *3.6 cm diameter simple appearing left ovarian cyst. If the patient is premenopausal,  no follow-up is indicated. If the patient is early postmenopausal, ultrasound is suggested at 6-12 months. Electronically Signed   By: Lucienne Capers M.D.   On: 09/07/2016 22:00    Procedures Procedures (including critical care time)  Medications Ordered in ED Medications  morphine 4 MG/ML injection 4 mg (not administered)  ondansetron (ZOFRAN) injection 4 mg (not administered)  0.9 %  sodium chloride infusion (not administered)     Initial Impression / Assessment and Plan / ED Course  I have  reviewed the triage vital signs and the nursing notes.  Pertinent labs & imaging results that were available during my care of the patient were reviewed by me and considered in my medical decision making (see chart for details).     Patient in emergency department with left lower quadrant abdominal pain. She has history of diverticulosis, suspect this could be diverticulitis. I will check labs, urinalysis, get CT abdomen and pelvis. Patient does appear to be in significant pain with left lower quadrant tenderness on exam, however there is no peritoneal signs. Will give morphine 4 mg and Zofran for pain and nausea.  8:34 PM Labs normal. Urinalysis unremarkable. CT scan pending.  10:28 PM CT scan showing epiploic appendagitis and left ovarian cyst. Also small liver cysts. Patient is not having any pain and right upper quadrant. Her pain in the left abdomen could be coming from epiploic appendagitis or from the cyst. Discussed treatment plan was tramadol for pain control, follow-up with family doctor for further evaluation and recheck. Patient voiced understanding and agreeable to plan. All questions answered. Return precautions discussed.  Vitals:   09/07/16 2045 09/07/16 2138  BP: 126/74 134/81  Pulse: 76 70  Resp:  16  Temp:  97.8 F (36.6 C)    Final Clinical Impressions(s) / ED Diagnoses   Final diagnoses:  Epiploic appendagitis  Cyst of left ovary    New Prescriptions New Prescriptions   TRAMADOL (ULTRAM) 50 MG TABLET    Take 1 tablet (50 mg total) by mouth every 6 (six) hours as needed.     Jeannett Senior, PA-C 09/08/16 [id number]    Quintella Reichert, MD 09/08/16 [phone]

## 2016-09-07 NOTE — ED Triage Notes (Signed)
Pt c/o LLQ pain onset yesterday also st's she has had diarrhea x's 2 months.  Pt denies any nausea or vomiting

## 2016-09-07 NOTE — Discharge Instructions (Signed)
Your blood work and urine analysis today is normal. Your CT scan shows epiploic appendagitis. This is a self limiting condition and your pain should improve in few days. You also have left ovarian cyst and will need further imaging with ultrasound if your pain does not improve.  Take tramadol as prescribed as needed for pain. Please follow up with family doctor for recheck in 2-3 days. Return if worsening symptoms.

## 2016-09-07 NOTE — ED Notes (Signed)
Pt being driven home by a friend. Wheeled out with work note. Pt verbalizes DC teaching. NAD. VSS.

## 2016-09-07 NOTE — ED Notes (Signed)
Patient transported to CT 

## 2016-09-07 NOTE — ED Notes (Signed)
Pt reports diarrhea for the last two months denies NV. Pt reports her pain started yesterday.

## 2016-09-07 NOTE — ED Notes (Signed)
ED Provider at bedside. 

## 2018-03-31 ENCOUNTER — Emergency Department (HOSPITAL_COMMUNITY): Payer: Managed Care, Other (non HMO)

## 2018-03-31 ENCOUNTER — Other Ambulatory Visit: Payer: Self-pay

## 2018-03-31 ENCOUNTER — Emergency Department (HOSPITAL_COMMUNITY)
Admission: EM | Admit: 2018-03-31 | Discharge: 2018-03-31 | Disposition: A | Discharge status: Home / Self Care | Payer: Managed Care, Other (non HMO) | Attending: Emergency Medicine | Admitting: Emergency Medicine

## 2018-03-31 ENCOUNTER — Encounter (HOSPITAL_COMMUNITY): Payer: Self-pay | Admitting: Emergency Medicine

## 2018-03-31 DIAGNOSIS — F17210 Nicotine dependence, cigarettes, uncomplicated: Secondary | ICD-10-CM | POA: Diagnosis not present

## 2018-03-31 DIAGNOSIS — I10 Essential (primary) hypertension: Secondary | ICD-10-CM | POA: Diagnosis not present

## 2018-03-31 DIAGNOSIS — R079 Chest pain, unspecified: Secondary | ICD-10-CM | POA: Diagnosis not present

## 2018-03-31 DIAGNOSIS — Z79899 Other long term (current) drug therapy: Secondary | ICD-10-CM | POA: Diagnosis not present

## 2018-03-31 LAB — BASIC METABOLIC PANEL
Anion gap: 11 (ref 5–15)
BUN: 12 mg/dL (ref 6–20)
CALCIUM: 9.8 mg/dL (ref 8.9–10.3)
CHLORIDE: 104 mmol/L (ref 98–111)
CO2: 24 mmol/L (ref 22–32)
CREATININE: 0.89 mg/dL (ref 0.44–1.00)
GFR calc Af Amer: >60 mL/min (ref >60)
GFR calc non Af Amer: >60 mL/min (ref >60)
Glucose, Bld: 122 mg/dL — ABNORMAL HIGH (ref 70–99)
POTASSIUM: 3.7 mmol/L (ref 3.5–5.1)
Sodium: 139 mmol/L (ref 135–145)

## 2018-03-31 LAB — CBC
HCT: 44.8 % (ref 36.0–46.0)
HEMOGLOBIN: 14.4 g/dL (ref 12.0–15.0)
MCH: 30.7 pg (ref 26.0–34.0)
MCHC: 32.1 g/dL (ref 30.0–36.0)
MCV: 95.5 fL (ref 80.0–100.0)
Platelets: 370 10*3/uL (ref 150–400)
RBC: 4.69 MIL/uL (ref 3.87–5.11)
RDW: 12.8 % (ref 11.5–15.5)
WBC: 9.4 10*3/uL (ref 4.0–10.5)
nRBC: 0 % (ref 0.0–0.2)

## 2018-03-31 LAB — I-STAT BETA HCG BLOOD, ED (MC, WL, AP ONLY): I-stat hCG, quantitative: <5 m[IU]/mL (ref <5)

## 2018-03-31 LAB — I-STAT TROPONIN, ED
Troponin i, poc: 0 ng/mL (ref 0.00–0.08)
Troponin i, poc: 0 ng/mL (ref 0.00–0.08)

## 2018-03-31 LAB — D-DIMER, QUANTITATIVE: D-Dimer, Quant: 0.56 ug/mL-FEU — ABNORMAL HIGH (ref 0.00–0.50)

## 2018-03-31 MED ORDER — IOPAMIDOL (ISOVUE-370) INJECTION 76%
INTRAVENOUS | Status: AC
  Administered 2018-03-31: 100 mL
  Filled 2018-03-31: qty 100

## 2018-03-31 MED ORDER — ASPIRIN 81 MG PO CHEW
324.0000 mg | CHEWABLE_TABLET | Freq: Once | ORAL | Status: AC
  Administered 2018-03-31: 324 mg via ORAL
  Filled 2018-03-31: qty 4

## 2018-03-31 NOTE — Discharge Instructions (Addendum)
Follow up with primary doctor

## 2018-03-31 NOTE — ED Triage Notes (Signed)
Pt with chest pain and hypertension. She reports that meds were adjusted recently. Pt reports shortness or breath and nausea. No cardiac hx. Swarm in progress.

## 2018-03-31 NOTE — ED Provider Notes (Signed)
[facility] EMERGENCY DEPARTMENT Provider Note   CSN: [id number] Arrival date & time: 03/31/18  1529     History   Chief Complaint Chief Complaint  Patient presents with  . Chest Pain    HPI Robyn Anderson is a 52 y.o. female.  The history is provided by the patient.  Chest Pain   This is a new problem. The current episode started more than 2 days ago. The problem occurs rarely. The problem has been resolved. The pain is associated with rest. The pain is present in the substernal region. The pain is at a severity of 2/10. The pain is mild. The quality of the pain is described as pressure-like. The pain does not radiate. The symptoms are aggravated by certain positions. Associated symptoms include shortness of breath. Pertinent negatives include no abdominal pain, no back pain, no claudication, no cough, no dizziness, no exertional chest pressure, no fever, no hemoptysis, no irregular heartbeat, no malaise/fatigue, no nausea, no near-syncope, no numbness, no orthopnea, no palpitations, no PND, no syncope, no vomiting and no weakness. She has tried rest for the symptoms. Risk factors include smoking/tobacco exposure.  Her past medical history is significant for hypertension.  Pertinent negatives for past medical history include no CAD, no hyperlipidemia, no PE and no seizures.  Pertinent negatives for family medical history include: no CAD.    Past Medical History:  Diagnosis Date  . GERD (gastroesophageal reflux disease)   . Headache    migraines 1-2/mo  . Hypertension     There are no active problems to display for this patient.   Past Surgical History:  Procedure Laterality Date  . ATHERECTOMY Right    renal  . CARPAL TUNNEL RELEASE Left   . CARPAL TUNNEL RELEASE Right 09/27/2015   Procedure: OPEN CARPAL TUNNEL RELEASE RIGHT HAND;  Surgeon: Leanor Kail, MD;  Location: [facility];  Service: Orthopedics;  Laterality: Right;  . CESAREAN SECTION      x2  . COLONOSCOPY    . ESOPHAGOGASTRODUODENOSCOPY    . TUBAL LIGATION       OB History   No obstetric history on file.      Home Medications    Prior to Admission medications   Medication Sig Start Date End Date Taking? Authorizing Provider  cyanocobalamin 2000 MCG tablet Take 2,000 mcg by mouth daily.   Yes [provider]  divalproex (DEPAKOTE ER) 500 MG 24 hr tablet Take 500 mg by mouth daily.  02/18/18 02/19/19 Yes [provider]  lisinopril (PRINIVIL,ZESTRIL) 10 MG tablet Take 20 mg by mouth daily. 03/14/18 03/14/19 Yes [provider]  omeprazole (PRILOSEC) 40 MG capsule Take 40 mg by mouth daily. 03/21/18  Yes [provider]  traMADol (ULTRAM) 50 MG tablet Take 1 tablet (50 mg total) by mouth every 6 (six) hours as needed. Patient not taking: Reported on 03/31/2018 09/07/16   Jeannett Senior, PA-C    Family History No family history on file.  Social History Social History   Tobacco Use  . Smoking status: Current Every Day Smoker    Packs/day: 0.50    Years: 30.00    Pack years: 15.00    Types: Cigarettes  . Smokeless tobacco: Never Used  Substance Use Topics  . Alcohol use: No  . Drug use: No     Allergies   Topamax [topiramate] and Vicodin [hydrocodone-acetaminophen]   Review of Systems Review of Systems  Constitutional: Negative for chills, fever and malaise/fatigue.  HENT:  Negative for ear pain and sore throat.   Eyes: Negative for pain and visual disturbance.  Respiratory: Positive for shortness of breath. Negative for cough, hemoptysis, wheezing and stridor.   Cardiovascular: Positive for chest pain and leg swelling. Negative for palpitations, orthopnea, claudication, syncope, PND and near-syncope.  Gastrointestinal: Negative for abdominal pain, nausea and vomiting.  Genitourinary: Negative for dysuria and hematuria.  Musculoskeletal: Negative for arthralgias and back pain.  Skin: Negative for color change  and rash.  Neurological: Negative for dizziness, seizures, syncope, weakness and numbness.  All other systems reviewed and are negative.    Physical Exam Updated Vital Signs  ED Triage Vitals [03/31/18 1543]  Enc Vitals Group     BP (!) 162/89     Pulse Rate 77     Resp 19     Temp 98.2 F (36.8 C)     Temp Source Oral     SpO2 99 %     Weight      Height      Head Circumference      Peak Flow      Pain Score      Pain Loc      Pain Edu?      Excl. in [place]?     Physical Exam Vitals signs and nursing note reviewed.  Constitutional:      General: She is not in acute distress.    Appearance: She is well-developed.  HENT:     Head: Normocephalic and atraumatic.  Eyes:     Extraocular Movements: Extraocular movements intact.     Conjunctiva/sclera: Conjunctivae normal.     Pupils: Pupils are equal, round, and reactive to light.  Neck:     Musculoskeletal: Normal range of motion and neck supple.  Cardiovascular:     Rate and Rhythm: Normal rate and regular rhythm.     Pulses:          Radial pulses are 2+ on the right side and 2+ on the left side.       Dorsalis pedis pulses are 2+ on the right side and 2+ on the left side.     Heart sounds: Normal heart sounds. No murmur.  Pulmonary:     Effort: Pulmonary effort is normal. No respiratory distress.     Breath sounds: Normal breath sounds. No decreased breath sounds, wheezing, rhonchi or rales.  Chest:     Chest wall: Tenderness present.  Abdominal:     General: Bowel sounds are normal.     Palpations: Abdomen is soft.     Tenderness: There is no abdominal tenderness.  Musculoskeletal: Normal range of motion.     Right lower leg: Edema present.     Left lower leg: Edema present.  Skin:    General: Skin is warm and dry.  Neurological:     General: No focal deficit present.     Mental Status: She is alert.     Comments: alert  Psychiatric:        Mood and Affect: Mood normal.      ED Treatments / Results    Labs (all labs ordered are listed, but only abnormal results are displayed) Labs Reviewed  BASIC METABOLIC PANEL - Abnormal; Notable for the following components:      Result Value   Glucose, Bld 122 (*)    All other components within normal limits  D-DIMER, QUANTITATIVE (NOT AT [facility]) - Abnormal; Notable for the following components:   D-Dimer, Quant 0.56 (*)  All other components within normal limits  CBC  I-STAT TROPONIN, ED  I-STAT BETA HCG BLOOD, ED ([facility], [facility], [facility] ONLY)  I-STAT TROPONIN, ED    EKG EKG Interpretation  Date/Time:  Thursday March 31 2018 15:47:02 EST Ventricular Rate:  80 PR Interval:    QRS Duration: 85 QT Interval:  369 QTC Calculation: 426 R Axis:   22 Text Interpretation:  Sinus rhythm Low voltage, precordial leads Confirmed by Lennice Sites [phone]) on 03/31/2018 4:05:46 PM   Radiology Ct Angio Chest Pe W And/or Wo Contrast  Result Date: 03/31/2018 CLINICAL DATA:  Chest pain and hypertension beginning today. EXAM: CT ANGIOGRAPHY CHEST WITH CONTRAST TECHNIQUE: Multidetector CT imaging of the chest was performed using the standard protocol during bolus administration of intravenous contrast. Multiplanar CT image reconstructions and MIPs were obtained to evaluate the vascular anatomy. CONTRAST:  132mL ISOVUE-370 IOPAMIDOL (ISOVUE-370) INJECTION 76% COMPARISON:  Chest radiography same day FINDINGS: Cardiovascular: Pulmonary arterial opacification is excellent. There are no pulmonary emboli. Heart size is normal. No coronary artery calcification is seen. No atherosclerotic change of the aorta is seen. No sign of dissection. Mediastinum/Nodes: Normal Lungs/Pleura: Lungs are clear. No effusions. Upper Abdomen: Normal except for 2 small low densities in the left lobe of the liver likely to represent cysts. Musculoskeletal: Ordinary mild thoracic degenerative changes. Review of the MIP images confirms the above findings. IMPRESSION: No pulmonary emboli or other  acute chest pathology. No cause of the presenting symptoms is identified. Electronically Signed   By: Nelson Chimes M.D.   On: 03/31/2018 17:53   Dg Chest Portable 1 View  Result Date: 03/31/2018 CLINICAL DATA:  Chest pain.  Shortness of breath. EXAM: PORTABLE CHEST 1 VIEW COMPARISON:  02/25/2014. FINDINGS: Mediastinum and hilar structures are normal. Heart size normal. No focal infiltrate. No pleural effusion or pneumothorax. IMPRESSION: No acute cardiopulmonary disease. Electronically Signed   By: Marcello Moores  Register   On: 03/31/2018 16:04    Procedures Procedures (including critical care time)  Medications Ordered in ED Medications  aspirin chewable tablet 324 mg (324 mg Oral Given 03/31/18 1654)  iopamidol (ISOVUE-370) 76 % injection (100 mLs  Contrast Given 03/31/18 1742)     Initial Impression / Assessment and Plan / ED Course  I have reviewed the triage vital signs and the nursing notes.  Pertinent labs & imaging results that were available during my care of the patient were reviewed by me and considered in my medical decision making (see chart for details).     HAILEI BESSER is a 51 year old female with history of hypertension who presents to the ED with chest pain.  Patient with unremarkable vitals.  No fever.  Patient with chest pain for the last several days on and off.  She has reproducible chest wall tenderness on exam.  She denies any shortness of breath, abdominal pain.  No midline spinal tenderness.  Patient with overall normal exam.  Clear breath sounds bilaterally.  No signs of volume overload.  EKG shows sinus rhythm with no signs of ischemic changes.  Chest x-ray showed no signs of pneumonia, pneumothorax, pleural effusion.  D-dimer was elevated and CT scan was performed that showed no acute process, no PE.  Patient with no significant anemia, electrolyte abnormality, kidney injury.  Patient had troponin negative x2.  No current chest pain upon my reevaluation.  Patient with  low heart score and recommend follow-up with cardiology or primary care doctor for further testing if needed.  Suspect possibly musculoskeletal strain.  Given return precautions.  Recommend Tylenol and Motrin.  Discharged from the ED in good condition.  This chart was dictated using voice recognition software.  Despite best efforts to proofread,  errors can occur which can change the documentation meaning.   Final Clinical Impressions(s) / ED Diagnoses   Final diagnoses:  Chest pain, unspecified type    ED Discharge Orders    None       Lennice Sites, DO 03/31/18 1948

## 2018-03-31 NOTE — ED Notes (Signed)
Pt verbalizes understanding of d/c instructions. Pt ambulatory at d/c with all belongings.   

## 2018-04-08 ENCOUNTER — Ambulatory Visit: Payer: Managed Care, Other (non HMO) | Admitting: Internal Medicine

## 2018-04-22 ENCOUNTER — Ambulatory Visit: Payer: Managed Care, Other (non HMO) | Admitting: Cardiovascular Disease

## 2018-04-22 ENCOUNTER — Encounter: Payer: Self-pay | Admitting: Cardiovascular Disease

## 2018-04-22 VITALS — BP 142/98 | HR 65 | Ht <= 58 in | Wt 147.8 lb

## 2018-04-22 DIAGNOSIS — E781 Pure hyperglyceridemia: Secondary | ICD-10-CM | POA: Diagnosis not present

## 2018-04-22 DIAGNOSIS — Z01812 Encounter for preprocedural laboratory examination: Secondary | ICD-10-CM

## 2018-04-22 DIAGNOSIS — R0789 Other chest pain: Secondary | ICD-10-CM | POA: Insufficient documentation

## 2018-04-22 DIAGNOSIS — Z87898 Personal history of other specified conditions: Secondary | ICD-10-CM | POA: Insufficient documentation

## 2018-04-22 DIAGNOSIS — Z720 Tobacco use: Secondary | ICD-10-CM | POA: Insufficient documentation

## 2018-04-22 DIAGNOSIS — R079 Chest pain, unspecified: Secondary | ICD-10-CM | POA: Diagnosis not present

## 2018-04-22 DIAGNOSIS — I10 Essential (primary) hypertension: Secondary | ICD-10-CM | POA: Insufficient documentation

## 2018-04-22 MED ORDER — LISINOPRIL 20 MG PO TABS: 20.0000 mg | ORAL_TABLET | Freq: Every day | ORAL | 3 refills | Status: DC

## 2018-04-22 MED ORDER — AMLODIPINE BESYLATE 5 MG PO TABS: 5.0000 mg | ORAL_TABLET | Freq: Every day | ORAL | 3 refills | Status: DC

## 2018-04-22 MED ORDER — METOPROLOL TARTRATE 100 MG PO TABS: 100.0000 mg | ORAL_TABLET | Freq: Once | ORAL | 0 refills | Status: DC

## 2018-04-22 NOTE — Assessment & Plan Note (Signed)
History of hypertriglyceridemia followed by her PCP

## 2018-04-22 NOTE — Progress Notes (Signed)
04/22/2018 Robyn Anderson   04-Dec-1965  [id number]  Primary Physician Patient, No Pcp Per Primary Cardiologist: Lorretta Harp MD Garret Reddish, [place], [place]  HPI:  Robyn Anderson is a 53 y.o. mild to moderately overweight divorced Caucasian female mother of 2, grandmother of 4 grandchildren referred by the emergency room for cardiovascular evaluation because of recent episode of atypical chest pain.  She works as a Gaffer at [employer].  Receptor profile is notable for ongoing tobacco abuse of 1/2 pack/day for the last 35 years, treated hypertension and hypertriglyceridemia.  She is never had a heart attack or stroke.  There is no family history of heart disease.  She was seen in the emergency room on 03/31/2018 with chest pain.  She ruled out for myocardial infarction.  She has had occasional chest pain since that time which occurs randomly last for seconds to minutes at a time.   Current Meds  Medication Sig  . Cholecalciferol (VITAMIN D) 50 MCG (2000 UT) tablet Take 2,000 Units by mouth daily.  . divalproex (DEPAKOTE ER) 500 MG 24 hr tablet Take 500 mg by mouth daily.   Marland Kitchen lisinopril (PRINIVIL,ZESTRIL) 10 MG tablet Take 20 mg by mouth daily.  Marland Kitchen omeprazole (PRILOSEC) 40 MG capsule Take 40 mg by mouth daily.     Allergies  Allergen Reactions  . Topamax [Topiramate]     tremors  . Vicodin [Hydrocodone-Acetaminophen] Nausea And Vomiting    Social History   Socioeconomic History  . Marital status: Single    Spouse name: Not on file  . Number of children: Not on file  . Years of education: Not on file  . Highest education level: Not on file  Occupational History  . Not on file  Social Needs  . Financial resource strain: Not on file  . Food insecurity:    Worry: Not on file    Inability: Not on file  . Transportation needs:    Medical: Not on file    Non-medical: Not on file  Tobacco Use  . Smoking status: Current Every Day Smoker    Packs/day: 0.50    Years: 30.00      Pack years: 15.00    Types: Cigarettes  . Smokeless tobacco: Never Used  Substance and Sexual Activity  . Alcohol use: No  . Drug use: No  . Sexual activity: Not on file  Lifestyle  . Physical activity:    Days per week: Not on file    Minutes per session: Not on file  . Stress: Not on file  Relationships  . Social connections:    Talks on phone: Not on file    Gets together: Not on file    Attends religious service: Not on file    Active member of club or organization: Not on file    Attends meetings of clubs or organizations: Not on file    Relationship status: Not on file  . Intimate partner violence:    Fear of current or ex partner: Not on file    Emotionally abused: Not on file    Physically abused: Not on file    Forced sexual activity: Not on file  Other Topics Concern  . Not on file  Social History Narrative  . Not on file     Review of Systems: General: negative for chills, fever, night sweats or weight changes.  Cardiovascular: negative for chest pain, dyspnea on exertion, edema, orthopnea, palpitations, paroxysmal nocturnal dyspnea or shortness  of breath Dermatological: negative for rash Respiratory: negative for cough or wheezing Urologic: negative for hematuria Abdominal: negative for nausea, vomiting, diarrhea, bright red blood per rectum, melena, or hematemesis Neurologic: negative for visual changes, syncope, or dizziness All other systems reviewed and are otherwise negative except as noted above.    Blood pressure (!) 142/98, pulse 65, height 4\' 9"  (1.448 m), weight 147 lb 12.8 oz (67 kg).  General appearance: alert and no distress Neck: no adenopathy, no carotid bruit, no JVD, supple, symmetrical, trachea midline and thyroid not enlarged, symmetric, no tenderness/mass/nodules Lungs: clear to auscultation bilaterally Heart: regular rate and rhythm, S1, S2 normal, no murmur, click, rub or gallop Extremities: extremities normal, atraumatic, no  cyanosis or edema Pulses: 2+ and symmetric Skin: Skin color, texture, turgor normal. No rashes or lesions Neurologic: Alert and oriented X 3, normal strength and tone. Normal symmetric reflexes. Normal coordination and gait  EKG sinus rhythm at 65 without ST or T wave changes.  I personally reviewed this EKG.  ASSESSMENT AND PLAN:   Tobacco abuse History tobacco abuse currently smoking 1/2 pack a day for the last 35 years.  Essential hypertension History of essential hypertension on lisinopril with blood pressure measured today at 142/98.  She has kept a blood pressure log which is consistently shown increased diastolic blood pressures.  I am going to change her lisinopril to 20 mg once a day and add amlodipine 5 mg a day.  She will continue to keep a blood pressure log and follow-up with Kristen in 1 month for further evaluation and medicine titration.  Hypertriglyceridemia History of hypertriglyceridemia followed by her PCP  Atypical chest pain Ms. Ghazi was referred to me by the ER for atypical chest pain.  Risk factors include tobacco abuse and hypertension.  She was seen in the ER on 03/31/2018 and ruled out for myocardial infarction.  She does get occasional chest pain lasting for seconds to minutes at a time.  I am going to get a coronary CTA to further evaluate.      Lorretta Harp MD FACP,FACC,FAHA, [facility] 04/22/2018 11:42 AM

## 2018-04-22 NOTE — Assessment & Plan Note (Signed)
Robyn Anderson was referred to me by the ER for atypical chest pain.  Risk factors include tobacco abuse and hypertension.  She was seen in the ER on 03/31/2018 and ruled out for myocardial infarction.  She does get occasional chest pain lasting for seconds to minutes at a time.  I am going to get a coronary CTA to further evaluate.

## 2018-04-22 NOTE — Assessment & Plan Note (Signed)
History of essential hypertension on lisinopril with blood pressure measured today at 142/98.  She has kept a blood pressure log which is consistently shown increased diastolic blood pressures.  I am going to change her lisinopril to 20 mg once a day and add amlodipine 5 mg a day.  She will continue to keep a blood pressure log and follow-up with Kristen in 1 month for further evaluation and medicine titration.

## 2018-04-22 NOTE — Assessment & Plan Note (Signed)
History tobacco abuse currently smoking 1/2 pack a day for the last 35 years.

## 2018-04-22 NOTE — Patient Instructions (Addendum)
Please arrive at the [facility] main entrance of [facility] at xx:xx AM (30-45 minutes prior to test start time)  [facility] [place], [place] [address] [phone]  Proceed to the [facility] Radiology Department (First Floor).  Please follow these instructions carefully (unless otherwise directed):    On the Night Before the Test: . Be sure to Drink plenty of water. . Do not consume any caffeinated/decaffeinated beverages or chocolate 12 hours prior to your test. . Do not take any antihistamines 12 hours prior to your test. . If you take Metformin do not take 24 hours prior to test. . If the patient has contrast allergy: ? Patient will need a prescription for Prednisone and very clear instructions (as follows): 1. Prednisone 50 mg - take 13 hours prior to test 2. Take another Prednisone 50 mg 7 hours prior to test 3. Take another Prednisone 50 mg 1 hour prior to test 4. Take Benadryl 50 mg 1 hour prior to test . Patient must complete all four doses of above prophylactic medications. . Patient will need a ride after test due to Benadryl.  On the Day of the Test: . Drink plenty of water. Do not drink any water within one hour of the test. . Do not eat any food 4 hours prior to the test. . You may take your regular medications prior to the test.  . Take metoprolol (Lopressor) two hours prior to test. . HOLD YOUR LISINOPRIL ON THE DAY OF YOUR PROCEDURE    *For Clinical Staff only. Please instruct patient the following:*        -Drink plenty of water       -Take metoprolol (Lopressor) 2 hours prior to test (if applicable).                  -IF HR is greater than 55 BPM and patient is less than or equal to 64 yrs old Lopressor 100mg  x1.                  Do not give Lopressor to patients with an allergy to lopressor or anyone with asthma or active COPD symptoms (currently taking steroids).       After the Test: . Drink plenty of  water. . After receiving IV contrast, you may experience a mild flushed feeling. This is normal. . On occasion, you may experience a mild rash up to 24 hours after the test. This is not dangerous. If this occurs, you can take Benadryl 25 mg and increase your fluid intake. . If you experience trouble breathing, this can be serious. If it is severe call 911 IMMEDIATELY. If it is mild, please call our office. . If you take any of these medications: Glipizide/Metformin, Avandament, Glucavance, please do not take 48 hours after completing test.   ADDITIONAL INSTRUCTIONS:  PLEASE KEEP A DAILY LOG OF YOUR BLOOD PRESSURES. PLEASE SEE KRISTIN ALVSTAD, [place] FOR THE HYPERTENSION CLINIC IN ONE MONTH

## 2018-04-23 LAB — BASIC METABOLIC PANEL
BUN/Creatinine Ratio: 12 (ref 9–23)
BUN: 10 mg/dL (ref 6–24)
CHLORIDE: 105 mmol/L (ref 96–106)
CO2: 22 mmol/L (ref 20–29)
Calcium: 9.1 mg/dL (ref 8.7–10.2)
Creatinine, Ser: 0.82 mg/dL (ref 0.57–1.00)
GFR calc Af Amer: 96 mL/min/{1.73_m2} (ref >59)
GFR calc non Af Amer: 84 mL/min/{1.73_m2} (ref >59)
Glucose: 75 mg/dL (ref 65–99)
POTASSIUM: 4.5 mmol/L (ref 3.5–5.2)
Sodium: 142 mmol/L (ref 134–144)

## 2018-04-23 LAB — CBC
Hematocrit: 43.6 % (ref 34.0–46.6)
Hemoglobin: 14.9 g/dL (ref 11.1–15.9)
MCH: 31.3 pg (ref 26.6–33.0)
MCHC: 34.2 g/dL (ref 31.5–35.7)
MCV: 92 fL (ref 79–97)
Platelets: 302 10*3/uL (ref 150–450)
RBC: 4.76 x10E6/uL (ref 3.77–5.28)
RDW: 12.7 % (ref 12.3–15.4)
WBC: 8.4 10*3/uL (ref 3.4–10.8)

## 2018-05-20 ENCOUNTER — Telehealth (HOSPITAL_COMMUNITY): Payer: Self-pay | Admitting: Emergency Medicine

## 2018-05-20 NOTE — Telephone Encounter (Signed)
Left message on voicemail with name and callback number Donjuan Robison RN Navigator Cardiac Imaging [facility] [phone] Office [phone] Cell  

## 2018-05-23 ENCOUNTER — Telehealth (HOSPITAL_COMMUNITY): Payer: Self-pay | Admitting: Emergency Medicine

## 2018-05-23 NOTE — Telephone Encounter (Signed)
Pt returning phone call--  pt verbalizes understanding of appt date/time, parking situation and where to check in, pre-test NPO status and medications ordered, and verified current allergies; name and call back number provided for further questions should they arise Marchia Bond RN Navigator Cardiac Imaging [facility] [phone] office [phone] cell

## 2018-05-24 ENCOUNTER — Ambulatory Visit: Payer: Managed Care, Other (non HMO)

## 2018-05-24 ENCOUNTER — Ambulatory Visit (HOSPITAL_COMMUNITY): Admission: RE | Admit: 2018-05-24 | Payer: Managed Care, Other (non HMO) | Source: Ambulatory Visit

## 2018-05-24 ENCOUNTER — Ambulatory Visit (HOSPITAL_COMMUNITY)
Admission: RE | Admit: 2018-05-24 | Discharge: 2018-05-24 | Disposition: A | Discharge status: Home / Self Care | Payer: Managed Care, Other (non HMO) | Source: Ambulatory Visit | Attending: Cardiovascular Disease | Admitting: Cardiovascular Disease

## 2018-05-24 DIAGNOSIS — R079 Chest pain, unspecified: Secondary | ICD-10-CM

## 2018-05-24 MED ORDER — NITROGLYCERIN 0.4 MG SL SUBL
SUBLINGUAL_TABLET | SUBLINGUAL | Status: AC
  Filled 2018-05-24: qty 2

## 2018-05-24 MED ORDER — NITROGLYCERIN 0.4 MG SL SUBL
0.8000 mg | SUBLINGUAL_TABLET | Freq: Once | SUBLINGUAL | Status: AC
  Administered 2018-05-24: 0.8 mg via SUBLINGUAL
  Filled 2018-05-24: qty 25

## 2018-05-24 MED ORDER — IOPAMIDOL (ISOVUE-370) INJECTION 76%
80.0000 mL | Freq: Once | INTRAVENOUS | Status: AC | PRN
  Administered 2018-05-24: 80 mL via INTRAVENOUS

## 2018-05-24 NOTE — Progress Notes (Signed)
Patient ambulatory out of department with steady gait. Denies any complaitns.

## 2018-05-24 NOTE — Progress Notes (Signed)
CT complete. Patient complains of headache. Patient given coke and cookies as requested.

## 2018-05-25 ENCOUNTER — Ambulatory Visit (INDEPENDENT_AMBULATORY_CARE_PROVIDER_SITE_OTHER): Payer: Managed Care, Other (non HMO) | Admitting: Pharmacist

## 2018-05-25 VITALS — BP 118/82 | HR 71 | Resp 15 | Ht <= 58 in | Wt 148.6 lb

## 2018-05-25 DIAGNOSIS — I10 Essential (primary) hypertension: Secondary | ICD-10-CM

## 2018-05-25 NOTE — Progress Notes (Signed)
Patient ID: Robyn Anderson                 DOB: May 13, 1965                      MRN: [id number]     HPI: Robyn Anderson is a 53 y.o. female referred by Dr. Gwenlyn Found to HTN clinic.  PMH includes atypical chest pain, tobacco abuse, hypertension, and hypertriglyceridemia.  Patient presents to clinic for hypertension medication titration and counseling. She reports severe leg pain after initiating statin therapy but otherwise feeling well. Denies chest pain, dizziness, swelling, blurry vision, or headaches.   Current HTN meds:  Amlodipine 5mg  daily Lisinopril 20mg  daily  BP goal: 130/80  Family History: multiple in grandmother, HTN in mother and father, MI in grandfather  Social History: decreasing amount of cigarettes, no alcohol  Diet: drinkn sweet tea and soda every day, mainly home cooked meals, lots of canned food,   Exercise: 3x per weeks at the gym (started in January)   Home BP readings:  17 readings; average 118/88; HR 68-91bpm  Wt Readings from Last 3 Encounters:  05/25/18 148 lb 9.6 oz (67.4 kg)  04/22/18 147 lb 12.8 oz (67 kg)  03/31/18 139 lb (63 kg)   BP Readings from Last 3 Encounters:  05/25/18 118/82  05/24/18 115/66  04/22/18 (!) 142/98   Pulse Readings from Last 3 Encounters:  05/25/18 71  04/22/18 65  03/31/18 64    Past Medical History:  Diagnosis Date  . GERD (gastroesophageal reflux disease)   . Headache    migraines 1-2/mo  . Hypertension     Current Outpatient Medications on File Prior to Visit  Medication Sig Dispense Refill  . amLODipine (NORVASC) 5 MG tablet Take 1 tablet (5 mg total) by mouth daily. 90 tablet 3  . atorvastatin (LIPITOR) 10 MG tablet Take 10 mg by mouth daily at 6 PM.    . Cholecalciferol (VITAMIN D) 50 MCG (2000 UT) tablet Take 2,000 Units by mouth daily.    . divalproex (DEPAKOTE ER) 500 MG 24 hr tablet Take 500 mg by mouth daily.     Marland Kitchen lisinopril (PRINIVIL,ZESTRIL) 20 MG tablet Take 1 tablet (20 mg total) by mouth daily. 90  tablet 3  . omeprazole (PRILOSEC) 40 MG capsule Take 40 mg by mouth daily.     No current facility-administered medications on file prior to visit.     Allergies  Allergen Reactions  . Atorvastatin Other (See Comments)    Muscle pain (generalized)  . Topamax [Topiramate]     tremors  . Vicodin [Hydrocodone-Acetaminophen] Nausea And Vomiting    Blood pressure 118/82, pulse 71, resp. rate 15, height 4\' 8"  (1.422 m), weight 148 lb 9.6 oz (67.4 kg).  Essential hypertension BP improved from last office visit ,but her diastolic pressures remain above goal. Patient reports compliance with therapy but admits to high sodium diet, and still smoking. Will continue current mediation without changes and work on lifestyle modifications. Plan to follow up in 2 weeks and increase amlodipine to 10mg  if additional BP control is needed.   Patient refused referral to care management at this time , but may benefits from their services. Will try again I near future.  I also recommended holding statin for 1 week ,then discuss alternative therapy with PCP.  Rameses Ou Rodriguez-Guzman PharmD, BCPS, [place] [id number] 05/30/2018 12:41 PM

## 2018-05-25 NOTE — Patient Instructions (Addendum)
Return for a follow up appointment in 2 weeks (Dr Gwenlyn Found)  Check your blood pressure at home daily (if able) and keep record of the readings.  CHANGES in medication as follows:  *HOLD atorvastatin for 1 week. Call Dr Arby Barrette if leg pain improves to request change in     cholesterol therapy*  *CONTINUE all other medication as previously prescribed*  *WORK on low sodium diet and smoking cessation*  Bring all of your meds, your BP cuff and your record of home blood pressures to your next appointment.  Exercise as you're able, try to walk approximately 30 minutes per day.  Keep salt intake to a minimum, especially watch canned and prepared boxed foods.  Eat more fresh fruits and vegetables and fewer canned items.  Avoid eating in fast food restaurants.    HOW TO TAKE YOUR BLOOD PRESSURE: . Rest 5 minutes before taking your blood pressure. .  Don't smoke or drink caffeinated beverages for at least 30 minutes before. . Take your blood pressure before (not after) you eat. . Sit comfortably with your back supported and both feet on the floor (don't cross your legs). . Elevate your arm to heart level on a table or a desk. . Use the proper sized cuff. It should fit smoothly and snugly around your bare upper arm. There should be enough room to slip a fingertip under the cuff. The bottom edge of the cuff should be 1 inch above the crease of the elbow. . Ideally, take 3 measurements at one sitting and record the average.

## 2018-05-30 ENCOUNTER — Encounter: Payer: Self-pay | Admitting: Pharmacist

## 2018-05-30 NOTE — Assessment & Plan Note (Signed)
BP improved from last office visit ,but her diastolic pressures remain above goal. Patient reports compliance with therapy but admits to high sodium diet, and still smoking. Will continue current mediation without changes and work on lifestyle modifications. Plan to follow up in 2 weeks and increase amlodipine to 10mg  if additional BP control is needed.   Patient refused referral to care management at this time , but may benefits from their services. Will try again I near future.  I also recommended holding statin for 1 week ,then discuss alternative therapy with PCP.

## 2018-06-03 ENCOUNTER — Ambulatory Visit: Payer: Managed Care, Other (non HMO) | Admitting: Cardiovascular Disease

## 2018-06-15 ENCOUNTER — Ambulatory Visit: Payer: Managed Care, Other (non HMO) | Admitting: Cardiovascular Disease

## 2018-07-13 ENCOUNTER — Encounter: Payer: Self-pay | Admitting: Cardiovascular Disease

## 2018-07-13 NOTE — Telephone Encounter (Signed)
° ° °  Patient calling to confirm if she needs to keep appt on Friday 3/27

## 2018-07-13 NOTE — Telephone Encounter (Signed)
Spoke with pt, she feels the amlodipine is causing her to have aching pain and cramping in her legs. It usually bothers her at night and occ will wake her from sleep. She did not realize she had taken it in the past and had to stop due to the same symptoms. Her bp is not totally controlled with the systolic running good but the diastolic in the high 75'G to 90. Will discuss with dr berry changing bp meds and then rescheduling appointment.

## 2018-07-14 NOTE — Telephone Encounter (Signed)
Dr Gwenlyn Found, do you want to see her in the office tomorrow or do a telephone visit.? She is not able to do video

## 2018-07-14 NOTE — Telephone Encounter (Signed)
This encounter was created in error - please disregard.

## 2018-07-14 NOTE — Telephone Encounter (Signed)
Follow up  ° ° °Patient is returning call.  °

## 2018-07-14 NOTE — Telephone Encounter (Signed)
Sounds good. We can discuss tomorrow in the office

## 2018-07-14 NOTE — Telephone Encounter (Signed)
Follow Up:    Pt said she had talked to yesterday,  Michela Pitcher she needs to talk to you again please.

## 2018-07-15 ENCOUNTER — Telehealth (INDEPENDENT_AMBULATORY_CARE_PROVIDER_SITE_OTHER): Payer: Managed Care, Other (non HMO) | Admitting: Cardiovascular Disease

## 2018-07-15 ENCOUNTER — Telehealth: Payer: Self-pay | Admitting: *Deleted

## 2018-07-15 DIAGNOSIS — Z79899 Other long term (current) drug therapy: Secondary | ICD-10-CM

## 2018-07-15 DIAGNOSIS — R0789 Other chest pain: Secondary | ICD-10-CM

## 2018-07-15 DIAGNOSIS — E785 Hyperlipidemia, unspecified: Secondary | ICD-10-CM

## 2018-07-15 DIAGNOSIS — Z7189 Other specified counseling: Secondary | ICD-10-CM | POA: Diagnosis not present

## 2018-07-15 DIAGNOSIS — I10 Essential (primary) hypertension: Secondary | ICD-10-CM

## 2018-07-15 NOTE — Patient Instructions (Signed)
Medication Instructions:  Your physician has recommended you make the following change in your medication:  STOP TAKING YOUR AMLODIPINE (NORVASC) If you need a refill on your cardiac medications before your next appointment, please call your pharmacy.   Lab work: NONE If you have labs (blood work) drawn today and your tests are completely normal, you will receive your results only by: Marland Kitchen MyChart Message (if you have MyChart) OR . A paper copy in the mail If you have any lab test that is abnormal or we need to change your treatment, we will call you to review the results.  Testing/Procedures: NONE  Follow-Up: At [facility], you and your health needs are our priority.  As part of our continuing mission to provide you with exceptional heart care, we have created designated Provider Care Teams.  These Care Teams include your primary Cardiologist (physician) and Advanced Practice Providers (APPs -  Physician Assistants and Nurse Practitioners) who all work together to provide you with the care you need, when you need it. . You will need a follow up appointment in 6 months.  Please call our office 2 months in advance to schedule this appointment.  You may see Dr. Gwenlyn Found or one of the following Advanced Practice Providers on your designated Care Team:   . Kerin Ransom, [place] . Almyra Deforest, PA-C . Fabian Sharp, PA-C . Jory Sims, DNP . Rosaria Ferries, PA-C . Roby Lofts, PA-C . Sande Rives, PA-C  Any Other Special Instructions Will Be Listed Below (If Applicable). KEEP A DAILY BLOOD PRESSURE LOG FOR AT LEAST 74 DAYS THEN FOLLOW UP WITH A CLINICAL PHARMACIST IN THE HYPERTENSION CLINIC. YOU WILL NEED AN APPOINTMENT. THIS APPOINTMENT WILL BE SCHEDULED SOMETIME IN THE NEAR FUTURE (IN APPROXIMATELY 2 MONTHS).

## 2018-07-15 NOTE — Telephone Encounter (Signed)
In the setting of the current Covid19 crisis, you are scheduled for a (phone or video) visit with your provider on (date) at (time).  Just as we do with many in-office visits, in order for you to participate in this visit, we must obtain consent.  If you'd like, I can send this to your mychart (if signed up) or email for you to review.  Otherwise, I can obtain your verbal consent now.  All virtual visits are billed to your insurance company just like a normal visit would be.  By agreeing to a virtual visit, we'd like you to understand that the technology does not allow for your provider to perform an examination, and thus may limit your provider's ability to fully assess your condition.  Finally, though the technology is pretty good, we cannot assure that it will always work on either your or our end, and in the setting of a video visit, we may have to convert it to a phone-only visit.  In either situation, we cannot ensure that we have a secure connection.  Are you willing to proceed?  TELEPHONE CALL NOTE  Robyn Anderson has been deemed a candidate for a follow-up tele-health visit to limit community exposure during the Covid-19 pandemic. I spoke with the patient via phone to ensure availability of phone/video source, confirm preferred email & phone number, and discuss instructions and expectations.  I reminded Robyn Anderson to be prepared with any vital sign and/or heart rhythm information that could potentially be obtained via home monitoring, at the time of her visit. I reminded Robyn Anderson to expect an e-mail containing a link for their video-based visit approximately 15 minutes before her visit, or alternatively, a phone call at the time of her visit if her visit is planned to be a phone encounter.  STAFF MUST READ CONSENT VERBATIM TO PATIENT BELOW - Did the patient verbally consent to treatment as below? YES  Robyn Anderson Robyn Part, RN 07/15/2018 1:20 PM  DOWNLOADING THE SOFTWARE (If applicable)   Download the [employer] app to enable video and telephone visits with your [facility] Provider.   Instructions for downloading [employer] WebEx: - Go to https://www.webex.com/downloads.html and follow the instructions - If you have technical difficulties with downloading WebEx, please call WebEx at [phone]. - Once the app is downloaded (can be done on either mobile or desktop computer), go to Settings in the upper left hand corner.  Be sure that camera and audio are enabled.  - You will receive an email message with a link to the meeting with a time to join for your tele-health visit.  - Please download the app and have settings configured prior to the appointment time.    THE NURSE REVIEWED THE PROCESS FOR DOWNLOADING THE [place] APP WITH THE PATIENT. PATIENT VERBALIZED UNDERSTANDING.  CONSENT FOR TELE-HEALTH VISIT - PLEASE REVIEW  I hereby voluntarily request, consent and authorize [facility] and its employed or contracted physicians, physician assistants, nurse practitioners or other licensed health care professionals (the Practitioner), to provide me with telemedicine health care services (the "Services") as deemed necessary by the treating Practitioner. I acknowledge and consent to receive the Services by the Practitioner via telemedicine. I understand that the telemedicine visit will involve communicating with the Practitioner through live audiovisual communication technology and the disclosure of certain medical information by electronic transmission. I acknowledge that I have been given the opportunity to request an in-person assessment or other available alternative prior to the telemedicine visit  and am voluntarily participating in the telemedicine visit.  I understand that I have the right to withhold or withdraw my consent to the use of telemedicine in the course of my care at any time, without affecting my right to future care or treatment, and that the Practitioner or I may  terminate the telemedicine visit at any time. I understand that I have the right to inspect all information obtained and/or recorded in the course of the telemedicine visit and may receive copies of available information for a reasonable fee.  I understand that some of the potential risks of receiving the Services via telemedicine include:  Marland Kitchen Delay or interruption in medical evaluation due to technological equipment failure or disruption; . Information transmitted may not be sufficient (e.g. poor resolution of images) to allow for appropriate medical decision making by the Practitioner; and/or  . In rare instances, security protocols could fail, causing a breach of personal health information.  Furthermore, I acknowledge that it is my responsibility to provide information about my medical history, conditions and care that is complete and accurate to the best of my ability. I acknowledge that Practitioner's advice, recommendations, and/or decision may be based on factors not within their control, such as incomplete or inaccurate data provided by me or distortions of diagnostic images or specimens that may result from electronic transmissions. I understand that the practice of medicine is not an exact science and that Practitioner makes no warranties or guarantees regarding treatment outcomes. I acknowledge that I will receive a copy of this consent concurrently upon execution via email to the email address I last provided but may also request a printed copy by calling the office of [facility].    I understand that my insurance will be billed for this visit.   I have read or had this consent read to me. . I understand the contents of this consent, which adequately explains the benefits and risks of the Services being provided via telemedicine.  . I have been provided ample opportunity to ask questions regarding this consent and the Services and have had my questions answered to my satisfaction. . I  give my informed consent for the services to be provided through the use of telemedicine in my medical care  By participating in this telemedicine visit I agree to the above.

## 2018-07-15 NOTE — Progress Notes (Signed)
Virtual Visit via Video Note    Evaluation Performed:  Follow-up visit  This visit type was conducted due to national recommendations for restrictions regarding the COVID-19 Pandemic (e.g. social distancing).  This format is felt to be most appropriate for this patient at this time.  All issues noted in this document were discussed and addressed.  No physical exam was performed (except for noted visual exam findings with Video Visits).  Please refer to the patient's chart (MyChart message for video visits and phone note for telephone visits) for the patient's consent to telehealth for [facility].  Date:  07/15/2018   ID:  ERZA Anderson, DOB Sep 08, 1965, MRN [id number]  Patient Location:  Patient's home  Provider location:   [facility] line  PCP:  Danae Orleans, MD  Cardiologist: Dr. Quay Burow Electrophysiologist:  None   Chief Complaint: Atypical chest pain, review Cor CTA results  History of Present Illness:    Robyn Anderson is a 53 y.o. female who presents via audio/video conferencing for a telehealth visit today.    The patient does not symptoms concerning for COVID-19 infection (fever, chills, cough, or new shortness of breath).   Robyn Anderson is a 53 y.o. mild to moderately overweight divorced Caucasian female mother of 2, grandmother of 4 grandchildren referred by the emergency room for cardiovascular evaluation because of recent episode of atypical chest pain.  She works as a Gaffer at [employer].  I last saw her in the office 04/22/2018.   Her cardiac risk profile is notable for ongoing tobacco abuse of 1/2 pack/day for the last 35 years, treated hypertension and hypertriglyceridemia.  She is never had a heart attack or stroke.  There is no family history of heart disease.  She was seen in the emergency room on 03/31/2018 with chest pain.  She ruled out for myocardial infarction.  She has had occasional chest pain since that time which occurs randomly last for  seconds to minutes at a time.  She had a coronary CTA performed 05/24/2018 which revealed a coronary calcium score of 0 and no evidence of CAD.  Since I saw her back 3 months ago she still continues to have off-and-on atypical chest pain.  She was put on a statin drug which she since has stopped because of intolerance.  She was also put on amlodipine by myself for hyper tension which actually helped her blood pressure but also as she feels this contributed to swelling and pain in her legs.    Prior CV studies:   The following studies were reviewed today:  Coronary CTA   Past Medical History:  Diagnosis Date  . GERD (gastroesophageal reflux disease)   . Headache    migraines 1-2/mo  . Hypertension    Past Surgical History:  Procedure Laterality Date  . ATHERECTOMY Right    renal  . CARPAL TUNNEL RELEASE Left   . CARPAL TUNNEL RELEASE Right 09/27/2015   Procedure: OPEN CARPAL TUNNEL RELEASE RIGHT HAND;  Surgeon: Leanor Kail, MD;  Location: [place];  Service: Orthopedics;  Laterality: Right;  . CESAREAN SECTION     x2  . COLONOSCOPY    . ESOPHAGOGASTRODUODENOSCOPY    . TUBAL LIGATION       No outpatient medications have been marked as taking for the 07/15/18 encounter (Appointment) with Lorretta Harp, MD.     Allergies:   Atorvastatin; Topamax [topiramate]; and Vicodin [hydrocodone-acetaminophen]   Social History   Tobacco Use  . Smoking  status: Current Every Day Smoker    Packs/day: 0.50    Years: 30.00    Pack years: 15.00    Types: Cigarettes  . Smokeless tobacco: Never Used  Substance Use Topics  . Alcohol use: No  . Drug use: No     Family Hx: The patient's family history is not on file.  ROS:   Please see the history of present illness.     All other systems reviewed and are negative.   Labs/Other Tests and Data Reviewed:    Recent Labs: 04/22/2018: BUN 10; Creatinine, Ser 0.82; Hemoglobin 14.9; Platelets 302; Potassium 4.5; Sodium 142    Recent Lipid Panel Lab Results  Component Value Date/Time   CHOL 204 (H) 09/08/2011 04:20 AM   TRIG 444 (H) 09/08/2011 04:20 AM   HDL 23 (L) 09/08/2011 04:20 AM   LDLCALC SEE COMMENT 09/08/2011 04:20 AM    Wt Readings from Last 3 Encounters:  05/25/18 148 lb 9.6 oz (67.4 kg)  04/22/18 147 lb 12.8 oz (67 kg)  03/31/18 139 lb (63 kg)     Exam:    Vital Signs:  There were no vitals taken for this visit.   A physical exam was not performed since this was a virtual visit  ASSESSMENT & PLAN:    1.  Atypical chest pain- coronary CTA performed on 05/24/2018 revealed a coronary calcium score of 0 and no evidence of CAD.  She continues to have atypical chest pain.  2: Essential hypertension- patient blood pressure today is 102/83 with a pulse of 85.  She was on lisinopril and I had started amlodipine at her last office visit.  Her blood pressures is under better control although she feels that this is contributing to some discomfort in her legs.  I have asked her to discontinue the amlodipine, keep a daily blood pressure log and I will arrange for her to see Cyril Mourning back in our hypertension clinic in several months for reevaluation and initiation of alternative antihypertensive medications.  3: Hyperlipidemia- started on statin therapy by her PCP this has since been discontinued because of intolerance.  This will be addressed by her PCP  COVID-19 Education: The signs and symptoms of COVID-19 were discussed with the patient and how to seek care for testing (follow up with PCP or arrange E-visit).  The importance of social distancing was discussed today.  Patient Risk:   After full review of this patients clinical status, I feel that they are at least moderate risk at this time.  Time:   Today, I have spent 20 minutes with the patient with telehealth technology discussing her chest pain, results of her coronary CTA, her hypertension and hyperlipidemia.  We also discussed her smoking habit..      Medication Adjustments/Labs and Tests Ordered: Current medicines are reviewed at length with the patient today.  Concerns regarding medicines are outlined above.  Tests Ordered: No orders of the defined types were placed in this encounter.  Medication Changes: No orders of the defined types were placed in this encounter.   Disposition:  Follow up in 3 month(s)  Signed, Quay Burow, MD  07/15/2018 1:52 PM    [facility]

## 2018-10-10 ENCOUNTER — Emergency Department (HOSPITAL_COMMUNITY)
Admission: EM | Admit: 2018-10-10 | Discharge: 2018-10-11 | Disposition: A | Discharge status: Home / Self Care | Payer: Managed Care, Other (non HMO) | Attending: Emergency Medicine | Admitting: Emergency Medicine

## 2018-10-10 ENCOUNTER — Other Ambulatory Visit: Payer: Self-pay

## 2018-10-10 ENCOUNTER — Telehealth: Payer: Self-pay | Admitting: Cardiovascular Disease

## 2018-10-10 ENCOUNTER — Encounter (HOSPITAL_COMMUNITY): Payer: Self-pay

## 2018-10-10 DIAGNOSIS — F17210 Nicotine dependence, cigarettes, uncomplicated: Secondary | ICD-10-CM | POA: Diagnosis not present

## 2018-10-10 DIAGNOSIS — R200 Anesthesia of skin: Secondary | ICD-10-CM | POA: Diagnosis not present

## 2018-10-10 DIAGNOSIS — I10 Essential (primary) hypertension: Secondary | ICD-10-CM | POA: Diagnosis not present

## 2018-10-10 DIAGNOSIS — Z79899 Other long term (current) drug therapy: Secondary | ICD-10-CM | POA: Insufficient documentation

## 2018-10-10 LAB — COMPREHENSIVE METABOLIC PANEL
ALT: 14 U/L (ref 0–44)
AST: 16 U/L (ref 15–41)
Albumin: 3.2 g/dL — ABNORMAL LOW (ref 3.5–5.0)
Alkaline Phosphatase: 48 U/L (ref 38–126)
Anion gap: 9 (ref 5–15)
BUN: 12 mg/dL (ref 6–20)
CO2: 22 mmol/L (ref 22–32)
Calcium: 9.2 mg/dL (ref 8.9–10.3)
Chloride: 107 mmol/L (ref 98–111)
Creatinine, Ser: 0.84 mg/dL (ref 0.44–1.00)
GFR calc Af Amer: >60 mL/min (ref >60)
GFR calc non Af Amer: >60 mL/min (ref >60)
Glucose, Bld: 103 mg/dL — ABNORMAL HIGH (ref 70–99)
Potassium: 4.3 mmol/L (ref 3.5–5.1)
Sodium: 138 mmol/L (ref 135–145)
Total Bilirubin: 0.2 mg/dL — ABNORMAL LOW (ref 0.3–1.2)
Total Protein: 6.1 g/dL — ABNORMAL LOW (ref 6.5–8.1)

## 2018-10-10 LAB — CBC
HCT: 42.4 % (ref 36.0–46.0)
Hemoglobin: 14.1 g/dL (ref 12.0–15.0)
MCH: 31.8 pg (ref 26.0–34.0)
MCHC: 33.3 g/dL (ref 30.0–36.0)
MCV: 95.5 fL (ref 80.0–100.0)
Platelets: 303 10*3/uL (ref 150–400)
RBC: 4.44 MIL/uL (ref 3.87–5.11)
RDW: 12.4 % (ref 11.5–15.5)
WBC: 10 10*3/uL (ref 4.0–10.5)
nRBC: 0 % (ref 0.0–0.2)

## 2018-10-10 LAB — I-STAT CHEM 8, ED
BUN: 13 mg/dL (ref 6–20)
Calcium, Ion: 1.23 mmol/L (ref 1.15–1.40)
Chloride: 107 mmol/L (ref 98–111)
Creatinine, Ser: 0.8 mg/dL (ref 0.44–1.00)
Glucose, Bld: 100 mg/dL — ABNORMAL HIGH (ref 70–99)
HCT: 43 % (ref 36.0–46.0)
Hemoglobin: 14.6 g/dL (ref 12.0–15.0)
Potassium: 4.2 mmol/L (ref 3.5–5.1)
Sodium: 138 mmol/L (ref 135–145)
TCO2: 23 mmol/L (ref 22–32)

## 2018-10-10 LAB — PROTIME-INR
INR: 1 (ref 0.8–1.2)
Prothrombin Time: 12.6 seconds (ref 11.4–15.2)

## 2018-10-10 LAB — DIFFERENTIAL
Abs Immature Granulocytes: 0.03 10*3/uL (ref 0.00–0.07)
Basophils Absolute: 0.1 10*3/uL (ref 0.0–0.1)
Basophils Relative: 1 %
Eosinophils Absolute: 0.2 10*3/uL (ref 0.0–0.5)
Eosinophils Relative: 2 %
Immature Granulocytes: 0 %
Lymphocytes Relative: 53 %
Lymphs Abs: 5.3 10*3/uL — ABNORMAL HIGH (ref 0.7–4.0)
Monocytes Absolute: 0.7 10*3/uL (ref 0.1–1.0)
Monocytes Relative: 7 %
Neutro Abs: 3.7 10*3/uL (ref 1.7–7.7)
Neutrophils Relative %: 37 %

## 2018-10-10 LAB — I-STAT BETA HCG BLOOD, ED (MC, WL, AP ONLY): I-stat hCG, quantitative: <5 m[IU]/mL (ref <5)

## 2018-10-10 LAB — APTT: aPTT: 29 seconds (ref 24–36)

## 2018-10-10 MED ORDER — LISINOPRIL 30 MG PO TABS: 30.0000 mg | ORAL_TABLET | Freq: Every day | ORAL | 1 refills | Status: DC

## 2018-10-10 NOTE — Telephone Encounter (Signed)
Pt c/o BP issue: STAT if pt c/o blurred vision, one-sided weakness or slurred speech  1. What are your last 5 BP readings?  128/94 151/91  153/97   2. Are you having any other symptoms (ex. Dizziness, headache, blurred vision, passed out)? no  3. What is your BP issue? Patient states her HR and BP continue to rise, Her mouth feels numb, and she is unsure if she should go to the ED  Patient states she just does not feel good. She states she had pain in her left arm and went numb at work. Last night she said she had a terrible headache so she went to sleep. When she woke up this morning she just felt funny. Her legs feel like they are on fire.

## 2018-10-10 NOTE — ED Triage Notes (Signed)
Pt states that she has been having mouth numbness for the past week, resolved and now numbness has returned at 1030AM, numbness is bilateral of both sides of mouth along with headache and hypertension. Neuro intact, hx of migraines

## 2018-10-10 NOTE — Telephone Encounter (Signed)
Spoke with pt who c/o elevated BP, headache, numbness and tingling to lips, and states that her legs feel like they're on fire. She states her most recent BP 163/97 with HR 81. Last BP 164/91 with HR 76. Pt states she had a 'really bad headache' last night and she still has one but it has decreased in severity. Per pt numbness and tingling to lips started last week along with numbness to L arm. She states since then numbness to L arm has disappeared but she still has numbness and tingling to lips. Denies indiscretion with salt. Denies CP, dyspnea, visual changes, confusion (is also A&Ox4). Speech sounds slurred and states she has generalized weakness. No difficulty swallowing, facial drooping. Pt on lisinopril 20 mg QD and states she was on amlodipine 5 mg QD but this was d/c. She does state hx migraines. Pt states that her legs have been feeling like they were on fire since last month when she discussed this with her PCP. She states PCP was going to refer her to neurologist. Advised that pt f/u on referral. Also advised that pt may f/u in ED for neuro symptoms.   Consulted with DOD Dr. Claiborne Billings who recommended pt increase lisinopril to 30 mg daily.   Pt aware of DOD recommendations and stated that she takes two 10 mg tablets of lisinopril daily and wanted to know if it was okay to take three tablets daily for 30 mg. Informed pt that this is okay but Rx for 30 mg tablet available for pick up at pharmacy. Advised pt to contact office to update on symptoms. Pt verbalized understanding and disconnected call. Called pt to set up appt with Kerin Ransom, PA-C on 6/26 and lmtcb

## 2018-10-11 ENCOUNTER — Telehealth: Payer: Self-pay | Admitting: Diagnostic Neuroimaging

## 2018-10-11 ENCOUNTER — Telehealth: Payer: Self-pay

## 2018-10-11 ENCOUNTER — Emergency Department (HOSPITAL_COMMUNITY): Payer: Managed Care, Other (non HMO)

## 2018-10-11 LAB — CBG MONITORING, ED: Glucose-Capillary: 92 mg/dL (ref 70–99)

## 2018-10-11 MED ORDER — PROCHLORPERAZINE EDISYLATE 10 MG/2ML IJ SOLN
10.0000 mg | Freq: Once | INTRAMUSCULAR | Status: AC
  Administered 2018-10-11: 10 mg via INTRAVENOUS
  Filled 2018-10-11: qty 2

## 2018-10-11 MED ORDER — SODIUM CHLORIDE 0.9 % IV BOLUS
500.0000 mL | Freq: Once | INTRAVENOUS | Status: AC
  Administered 2018-10-11: 500 mL via INTRAVENOUS

## 2018-10-11 NOTE — ED Provider Notes (Signed)
[place] EMERGENCY DEPARTMENT Provider Note   CSN: [id number] Arrival date & time: 10/10/18  1937    History   Chief Complaint Chief Complaint  Patient presents with  . Numbness    HPI Robyn Anderson is a 53 y.o. female.     HPI Patient presents with facial numbness. Onset was within the past day, has been intermittent, including is currently present. Numbness is perioral, possibly was more widespread earlier. There is no associated weakness in any extremity.  The patient does have a headache, notes that she does have a history of headaches, does not have similar accommodation as she is experiencing today.  Acknowledges multiple medical issues including hypertension, denies change in recent medications. Today, after she developed the numbness, headache, she spoke with her cardiologist's office and was sent here for evaluation.  Past Medical History:  Diagnosis Date  . GERD (gastroesophageal reflux disease)   . Headache    migraines 1-2/mo  . Hypertension     Patient Active Problem List   Diagnosis Date Noted  . Essential hypertension 04/22/2018  . Hypertriglyceridemia 04/22/2018  . Atypical chest pain 04/22/2018  . Tobacco abuse 04/22/2018    Past Surgical History:  Procedure Laterality Date  . ATHERECTOMY Right    renal  . CARPAL TUNNEL RELEASE Left   . CARPAL TUNNEL RELEASE Right 09/27/2015   Procedure: OPEN CARPAL TUNNEL RELEASE RIGHT HAND;  Surgeon: Leanor Kail, MD;  Location: [place];  Service: Orthopedics;  Laterality: Right;  . CESAREAN SECTION     x2  . COLONOSCOPY    . ESOPHAGOGASTRODUODENOSCOPY    . TUBAL LIGATION       OB History   No obstetric history on file.      Home Medications    Prior to Admission medications   Medication Sig Start Date End Date Taking? Authorizing Provider  amLODipine (NORVASC) 5 MG tablet Take 1 tablet (5 mg total) by mouth daily. 04/22/18 07/21/18  Lorretta Harp, MD   atorvastatin (LIPITOR) 10 MG tablet Take 10 mg by mouth daily at 6 PM.    [provider]  Cholecalciferol (VITAMIN D) 50 MCG (2000 UT) tablet Take 2,000 Units by mouth daily.    [provider]  divalproex (DEPAKOTE ER) 500 MG 24 hr tablet Take 500 mg by mouth daily.  02/18/18 02/19/19  [provider]  lisinopril (ZESTRIL) 30 MG tablet Take 1 tablet (30 mg total) by mouth daily. 10/10/18 10/10/19  Troy Sine, MD  omeprazole (PRILOSEC) 40 MG capsule Take 40 mg by mouth daily. 03/21/18   [provider]    Family History No family history on file.  Social History Social History   Tobacco Use  . Smoking status: Current Every Day Smoker    Packs/day: 0.50    Years: 30.00    Pack years: 15.00    Types: Cigarettes  . Smokeless tobacco: Never Used  Substance Use Topics  . Alcohol use: No  . Drug use: No     Allergies   Atorvastatin, Topamax [topiramate], and Vicodin [hydrocodone-acetaminophen]   Review of Systems Review of Systems  Constitutional:       Per HPI, otherwise negative  HENT:       Per HPI, otherwise negative  Respiratory:       Per HPI, otherwise negative  Cardiovascular:       Per HPI, otherwise negative  Gastrointestinal: Negative for vomiting.  Endocrine:       Negative  aside from HPI  Genitourinary:       Neg aside from HPI   Musculoskeletal:       Per HPI, otherwise negative  Skin: Negative.   Neurological: Positive for numbness and headaches. Negative for syncope.     Physical Exam Updated Vital Signs BP (!) 141/66   Pulse 63   Temp 98 F (36.7 C) (Oral)   Resp 17   SpO2 100%   Physical Exam Vitals signs and nursing note reviewed.  Constitutional:      General: She is not in acute distress.    Appearance: She is well-developed.  HENT:     Head: Normocephalic and atraumatic.  Eyes:     Conjunctiva/sclera: Conjunctivae normal.  Cardiovascular:     Rate and Rhythm: Normal rate and regular rhythm.   Pulmonary:     Effort: Pulmonary effort is normal. No respiratory distress.     Breath sounds: Normal breath sounds. No stridor.  Abdominal:     General: There is no distension.  Skin:    General: Skin is warm and dry.  Neurological:     Mental Status: She is alert and oriented to person, place, and time.     Cranial Nerves: Cranial nerves are intact. No cranial nerve deficit.     Motor: Motor function is intact.     Coordination: Coordination is intact.      ED Treatments / Results  Labs (all labs ordered are listed, but only abnormal results are displayed) Labs Reviewed  DIFFERENTIAL - Abnormal; Notable for the following components:      Result Value   Lymphs Abs 5.3 (*)    All other components within normal limits  COMPREHENSIVE METABOLIC PANEL - Abnormal; Notable for the following components:   Glucose, Bld 103 (*)    Total Protein 6.1 (*)    Albumin 3.2 (*)    Total Bilirubin 0.2 (*)    All other components within normal limits  I-STAT CHEM 8, ED - Abnormal; Notable for the following components:   Glucose, Bld 100 (*)    All other components within normal limits  PROTIME-INR  APTT  CBC  CBG MONITORING, ED  I-STAT BETA HCG BLOOD, ED ([facility], [facility], [facility] ONLY)    EKG EKG Interpretation  Date/Time:  Tuesday October 11 2018 01:29:57 EDT Ventricular Rate:  63 PR Interval:    QRS Duration: 97 QT Interval:  409 QTC Calculation: 419 R Axis:   33 Text Interpretation:  Sinus rhythm Low voltage, precordial leads Artifact Abnormal ECG Confirmed by Carmin Muskrat [phone]) on 10/11/2018 2:06:36 AM   Radiology Mr Brain Wo Contrast  Result Date: 10/11/2018 CLINICAL DATA:  Bilateral mouth numbness intermittently EXAM: MRI HEAD WITHOUT CONTRAST TECHNIQUE: Multiplanar, multiecho pulse sequences of the brain and surrounding structures were obtained without intravenous contrast. COMPARISON:  10/07/2011 FINDINGS: Brain: No acute infarction, hemorrhage, hydrocephalus, extra-axial collection  or mass lesion. Scant FLAIR hyperintensity around the left lateral ventricle, also seen in 2013. Vascular: Normal flow voids. Skull and upper cervical spine: Normal marrow signal. Sinuses/Orbits: Negative IMPRESSION: Stable and negative brain MRI. Electronically Signed   By: Monte Fantasia M.D.   On: 10/11/2018 05:10    Procedures Procedures (including critical care time)  Medications Ordered in ED Medications  sodium chloride 0.9 % bolus 500 mL (0 mLs Intravenous Stopped 10/11/18 0412)  prochlorperazine (COMPAZINE) injection 10 mg (10 mg Intravenous Given 10/11/18 0258)     Initial Impression / Assessment and Plan / ED Course  I  have reviewed the triage vital signs and the nursing notes.  Pertinent labs & imaging results that were available during my care of the patient were reviewed by me and considered in my medical decision making (see chart for details).    Update: Initial lab studies reassuring.    5:42 AM Patient awake, alert, in no distress sitting up on the edge of her bed speaking clearly, no focal deficits. We discussed all findings, reassuring MRI, labs, no evidence for stroke.  On some suspicion for complex migraine given her history, description of numbness, absence of objective neuro deficits. Patient will follow-up with primary care and neurology, was discharged in stable condition.  Final Clinical Impressions(s) / ED Diagnoses   Final diagnoses:  Numbness     Carmin Muskrat, MD 10/11/18 [phone]

## 2018-10-11 NOTE — Discharge Instructions (Addendum)
As discussed, your evaluation today has been largely reassuring.  But, it is important that you monitor your condition carefully, and do not hesitate to return to the ED if you develop new, or concerning changes in your condition.  Otherwise, please follow-up with your physician for appropriate ongoing care.  Please be sure to follow-up with a neurologist.  You have been provided to you different neurology clinics, please call to see which can accommodate you earliest.

## 2018-10-11 NOTE — ED Notes (Signed)
Attempted 2x to place IV.

## 2018-10-11 NOTE — ED Notes (Signed)
CBG results of 92 reported to [place], RN.

## 2018-10-11 NOTE — Telephone Encounter (Signed)
°  Due to current COVID 19 pandemic, our office is severely reducing in office visits until further notice, in order to minimize the risk to our patients and healthcare providers.    Spoke with patient and scheduled a MyChart video visit with Dr. Leta Baptist for 6/24. Patient stated they already gave her the information to set up her MyChart when she left the hospital, so she will do that today. I req she call back if she has any trouble or any questions.   Pt understands that although there may be some limitations with this type of visit, we will take all precautions to reduce any security or privacy concerns.  Pt understands that this will be treated like an in office visit and we will file with pt's insurance, and there may be a patient responsible charge related to this service.

## 2018-10-11 NOTE — ED Notes (Signed)
Patient transported to MRI 

## 2018-10-12 ENCOUNTER — Encounter: Payer: Managed Care, Other (non HMO) | Admitting: Diagnostic Neuroimaging

## 2018-10-12 ENCOUNTER — Telehealth: Payer: Self-pay | Admitting: Cardiovascular Disease

## 2018-10-12 NOTE — Telephone Encounter (Signed)
I called and left a voice message reminding patient of her appointment on 10-14-18 with Kerin Ransom.

## 2018-10-13 NOTE — Telephone Encounter (Signed)
error 

## 2018-10-14 ENCOUNTER — Encounter: Payer: Self-pay | Admitting: General Practice

## 2018-10-14 ENCOUNTER — Ambulatory Visit: Payer: Managed Care, Other (non HMO) | Admitting: General Practice

## 2018-10-14 ENCOUNTER — Other Ambulatory Visit: Payer: Self-pay

## 2018-10-14 VITALS — BP 110/62 | HR 84 | Temp 97.9°F | Ht <= 58 in | Wt 149.0 lb

## 2018-10-14 DIAGNOSIS — E781 Pure hyperglyceridemia: Secondary | ICD-10-CM | POA: Diagnosis not present

## 2018-10-14 DIAGNOSIS — R079 Chest pain, unspecified: Secondary | ICD-10-CM

## 2018-10-14 DIAGNOSIS — I10 Essential (primary) hypertension: Secondary | ICD-10-CM | POA: Diagnosis not present

## 2018-10-14 NOTE — Patient Instructions (Signed)
Medication Instructions:  Your physician recommends that you continue on your current medications as directed. Please refer to the Current Medication list given to you today. If you need a refill on your cardiac medications before your next appointment, please call your pharmacy.   Lab work: None  If you have labs (blood work) drawn today and your tests are completely normal, you will receive your results only by: Marland Kitchen MyChart Message (if you have MyChart) OR . A paper copy in the mail If you have any lab test that is abnormal or we need to change your treatment, we will call you to review the results.  Testing/Procedures: None   Follow-Up: At [facility], you and your health needs are our priority.  As part of our continuing mission to provide you with exceptional heart care, we have created designated Provider Care Teams.  These Care Teams include your primary Cardiologist (physician) and Advanced Practice Providers (APPs -  Physician Assistants and Nurse Practitioners) who all work together to provide you with the care you need, when you need it. You will need a follow up appointment in 12 months.  Please call our office 2 months in advance to schedule this appointment.  You may see Quay Burow, MD or one of the following Advanced Practice Providers on your designated Care Team:   Kerin Ransom, PA-C Roby Lofts, [place] . Sande Rives, PA-C  Any Other Special Instructions Will Be Listed Below (If Applicable). 1. Eat a heart healthy diet 2. Lose weight 3. Stop smoking   Heart-Healthy Eating Plan Heart-healthy meal planning includes:  Eating less unhealthy fats.  Eating more healthy fats.  Making other changes in your diet. Talk with your doctor or a diet specialist (dietitian) to create an eating plan that is right for you. What is my plan? Your doctor may recommend an eating plan that includes:  Total fat: ______% or less of total calories a day.  Saturated fat:  ______% or less of total calories a day.  Cholesterol: less than _________mg a day. What are tips for following this plan? Cooking Avoid frying your food. Try to bake, boil, grill, or broil it instead. You can also reduce fat by:  Removing the skin from poultry.  Removing all visible fats from meats.  Steaming vegetables in water or broth. Meal planning   At meals, divide your plate into four equal parts: ? Fill one-half of your plate with vegetables and green salads. ? Fill one-fourth of your plate with whole grains. ? Fill one-fourth of your plate with lean protein foods.  Eat 4-5 servings of vegetables per day. A serving of vegetables is: ? 1 cup of raw or cooked vegetables. ? 2 cups of raw leafy greens.  Eat 4-5 servings of fruit per day. A serving of fruit is: ? 1 medium whole fruit. ?  cup of dried fruit. ?  cup of fresh, frozen, or canned fruit. ?  cup of 100% fruit juice.  Eat more foods that have soluble fiber. These are apples, broccoli, carrots, beans, peas, and barley. Try to get 20-30 g of fiber per day.  Eat 4-5 servings of nuts, legumes, and seeds per week: ? 1 serving of dried beans or legumes equals  cup after being cooked. ? 1 serving of nuts is  cup. ? 1 serving of seeds equals 1 tablespoon. General information  Eat more home-cooked food. Eat less restaurant, buffet, and fast food.  Limit or avoid alcohol.  Limit foods that are high  in starch and sugar.  Avoid fried foods.  Lose weight if you are overweight.  Keep track of how much salt (sodium) you eat. This is important if you have high blood pressure. Ask your doctor to tell you more about this.  Try to add vegetarian meals each week. Fats  Choose healthy fats. These include olive oil and canola oil, flaxseeds, walnuts, almonds, and seeds.  Eat more omega-3 fats. These include salmon, mackerel, sardines, tuna, flaxseed oil, and ground flaxseeds. Try to eat fish at least 2 times each  week.  Check food labels. Avoid foods with trans fats or high amounts of saturated fat.  Limit saturated fats. ? These are often found in animal products, such as meats, butter, and cream. ? These are also found in plant foods, such as palm oil, palm kernel oil, and coconut oil.  Avoid foods with partially hydrogenated oils in them. These have trans fats. Examples are stick margarine, some tub margarines, cookies, crackers, and other baked goods. What foods can I eat? Fruits All fresh, canned (in natural juice), or frozen fruits. Vegetables Fresh or frozen vegetables (raw, steamed, roasted, or grilled). Green salads. Grains Most grains. Choose whole wheat and whole grains most of the time. Rice and pasta, including brown rice and pastas made with whole wheat. Meats and other proteins Lean, well-trimmed beef, veal, pork, and lamb. Chicken and [place] without skin. All fish and shellfish. Wild duck, rabbit, pheasant, and venison. Egg whites or low-cholesterol egg substitutes. Dried beans, peas, lentils, and tofu. Seeds and most nuts. Dairy Low-fat or nonfat cheeses, including ricotta and mozzarella. Skim or 1% milk that is liquid, powdered, or evaporated. Buttermilk that is made with low-fat milk. Nonfat or low-fat yogurt. Fats and oils Non-hydrogenated (trans-free) margarines. Vegetable oils, including soybean, sesame, sunflower, olive, peanut, safflower, corn, canola, and cottonseed. Salad dressings or mayonnaise made with a vegetable oil. Beverages Mineral water. Coffee and tea. Diet carbonated beverages. Sweets and desserts Sherbet, gelatin, and fruit ice. Small amounts of dark chocolate. Limit all sweets and desserts. Seasonings and condiments All seasonings and condiments. The items listed above may not be a complete list of foods and drinks you can eat. Contact a dietitian for more options. What foods should I avoid? Fruits Canned fruit in heavy syrup. Fruit in cream or butter  sauce. Fried fruit. Limit coconut. Vegetables Vegetables cooked in cheese, cream, or butter sauce. Fried vegetables. Grains Breads that are made with saturated or trans fats, oils, or whole milk. Croissants. Sweet rolls. Donuts. High-fat crackers, such as cheese crackers. Meats and other proteins Fatty meats, such as hot dogs, ribs, sausage, bacon, rib-eye roast or steak. High-fat deli meats, such as salami and bologna. Caviar. Domestic duck and goose. Organ meats, such as liver. Dairy Cream, sour cream, cream cheese, and creamed cottage cheese. Whole-milk cheeses. Whole or 2% milk that is liquid, evaporated, or condensed. Whole buttermilk. Cream sauce or high-fat cheese sauce. Yogurt that is made from whole milk. Fats and oils Meat fat, or shortening. Cocoa butter, hydrogenated oils, palm oil, coconut oil, palm kernel oil. Solid fats and shortenings, including bacon fat, salt pork, lard, and butter. Nondairy cream substitutes. Salad dressings with cheese or sour cream. Beverages Regular sodas and juice drinks with added sugar. Sweets and desserts Frosting. Pudding. Cookies. Cakes. Pies. Milk chocolate or white chocolate. Buttered syrups. Full-fat ice cream or ice cream drinks. The items listed above may not be a complete list of foods and drinks to avoid. Contact a dietitian  for more information. Summary  Heart-healthy meal planning includes eating less unhealthy fats, eating more healthy fats, and making other changes in your diet.  Eat a balanced diet. This includes fruits and vegetables, low-fat or nonfat dairy, lean protein, nuts and legumes, whole grains, and heart-healthy oils and fats. This information is not intended to replace advice given to you by your health care provider. Make sure you discuss any questions you have with your health care provider. Document Released: 10/06/2011 Document Revised: 05/14/2017 Document Reviewed: 05/14/2017 Elsevier Interactive Patient Education  2019  [employer].  Exercising to [employer] Exercise is structured, repetitive physical activity to improve fitness and health. Getting regular exercise is important for everyone. It is especially important if you are overweight. Being overweight increases your risk of heart disease, stroke, diabetes, high blood pressure, and several types of cancer. Reducing your calorie intake and exercising can help you lose weight. Exercise is usually categorized as moderate or vigorous intensity. To lose weight, most people need to do a certain amount of moderate-intensity or vigorous-intensity exercise each week. Moderate-intensity exercise  Moderate-intensity exercise is any activity that gets you moving enough to burn at least three times more energy (calories) than if you were sitting. Examples of moderate exercise include:  Walking a mile in 15 minutes.  Doing light yard work.  Biking at an easy pace. Most people should get at least 150 minutes (2 hours and 30 minutes) a week of moderate-intensity exercise to maintain their body weight. Vigorous-intensity exercise Vigorous-intensity exercise is any activity that gets you moving enough to burn at least six times more calories than if you were sitting. When you exercise at this intensity, you should be working hard enough that you are not able to carry on a conversation. Examples of vigorous exercise include:  Running.  Playing a team sport, such as football, basketball, and soccer.  Jumping rope. Most people should get at least 75 minutes (1 hour and 15 minutes) a week of vigorous-intensity exercise to maintain their body weight. How can exercise affect me? When you exercise enough to burn more calories than you eat, you lose weight. Exercise also reduces body fat and builds muscle. The more muscle you have, the more calories you burn. Exercise also:  Improves mood.  Reduces stress and tension.  Improves your overall fitness, flexibility, and  endurance.  Increases bone strength. The amount of exercise you need to lose weight depends on:  Your age.  The type of exercise.  Any health conditions you have.  Your overall physical ability. Talk to your health care provider about how much exercise you need and what types of activities are safe for you. What actions can I take to lose weight? Nutrition   Make changes to your diet as told by your health care provider or diet and nutrition specialist (dietitian). This may include: ? Eating fewer calories. ? Eating more protein. ? Eating less unhealthy fats. ? Eating a diet that includes fresh fruits and vegetables, whole grains, low-fat dairy products, and lean protein. ? Avoiding foods with added fat, salt, and sugar.  Drink plenty of water while you exercise to prevent dehydration or heat stroke. Activity  Choose an activity that you enjoy and set realistic goals. Your health care provider can help you make an exercise plan that works for you.  Exercise at a moderate or vigorous intensity most days of the week. ? The intensity of exercise may vary from person to person. You can tell how  intense a workout is for you by paying attention to your breathing and heartbeat. Most people will notice their breathing and heartbeat get faster with more intense exercise.  Do resistance training twice each week, such as: ? Push-ups. ? Sit-ups. ? Lifting weights. ? Using resistance bands.  Getting short amounts of exercise can be just as helpful as long structured periods of exercise. If you have trouble finding time to exercise, try to include exercise in your daily routine. ? Get up, stretch, and walk around every 30 minutes throughout the day. ? Go for a walk during your lunch break. ? Park your car farther away from your destination. ? If you take public transportation, get off one stop early and walk the rest of the way. ? Make phone calls while standing up and walking around. ?  Take the stairs instead of elevators or escalators.  Wear comfortable clothes and shoes with good support.  Do not exercise so much that you hurt yourself, feel dizzy, or get very short of breath. Where to find more information  [place] Department of Health and Human Services: [employer].at  Centers for Disease Control and Prevention (CDC): http://www.wolf.info/ Contact a health care provider:  Before starting a new exercise program.  If you have questions or concerns about your weight.  If you have a medical problem that keeps you from exercising. Get help right away if you have any of the following while exercising:  Injury.  Dizziness.  Difficulty breathing or shortness of breath that does not go away when you stop exercising.  Chest pain.  Rapid heartbeat. Summary  Being overweight increases your risk of heart disease, stroke, diabetes, high blood pressure, and several types of cancer.  Losing weight happens when you burn more calories than you eat.  Reducing the amount of calories you eat in addition to getting regular moderate or vigorous exercise each week helps you lose weight. This information is not intended to replace advice given to you by your health care provider. Make sure you discuss any questions you have with your health care provider. Document Released: 05/09/2010 Document Revised: 04/19/2017 Document Reviewed: 04/19/2017 Elsevier Interactive Patient Education  2019 [employer].  Steps to Quit Smoking  Smoking tobacco can be bad for your health. It can also affect almost every organ in your body. Smoking puts you and people around you at risk for many serious long-lasting (chronic) diseases. Quitting smoking is hard, but it is one of the best things that you can do for your health. It is never too late to quit. What are the benefits of quitting smoking? When you quit smoking, you lower your risk for getting serious diseases and conditions. They can include:  Lung  cancer or lung disease.  Heart disease.  Stroke.  Heart attack.  Not being able to have children (infertility).  Weak bones (osteoporosis) and broken bones (fractures). If you have coughing, wheezing, and shortness of breath, those symptoms may get better when you quit. You may also get sick less often. If you are pregnant, quitting smoking can help to lower your chances of having a baby of low birth weight. What can I do to help me quit smoking? Talk with your doctor about what can help you quit smoking. Some things you can do (strategies) include:  Quitting smoking totally, instead of slowly cutting back how much you smoke over a period of time.  Going to in-person counseling. You are more likely to quit if you go to many counseling  sessions.  Using resources and support systems, such as: ? Database administrator with a Social worker. ? Phone quitlines. ? Careers information officer. ? Support groups or group counseling. ? Text messaging programs. ? Mobile phone apps or applications.  Taking medicines. Some of these medicines may have nicotine in them. If you are pregnant or breastfeeding, do not take any medicines to quit smoking unless your doctor says it is okay. Talk with your doctor about counseling or other things that can help you. Talk with your doctor about using more than one strategy at the same time, such as taking medicines while you are also going to in-person counseling. This can help make quitting easier. What things can I do to make it easier to quit? Quitting smoking might feel very hard at first, but there is a lot that you can do to make it easier. Take these steps:  Talk to your family and friends. Ask them to support and encourage you.  Call phone quitlines, reach out to support groups, or work with a Social worker.  Ask people who smoke to not smoke around you.  Avoid places that make you want (trigger) to smoke, such as: ? Bars. ? Parties. ? Smoke-break areas at work.   Spend time with people who do not smoke.  Lower the stress in your life. Stress can make you want to smoke. Try these things to help your stress: ? Getting regular exercise. ? Deep-breathing exercises. ? Yoga. ? Meditating. ? Doing a body scan. To do this, close your eyes, focus on one area of your body at a time from head to toe, and notice which parts of your body are tense. Try to relax the muscles in those areas.  Download or buy apps on your mobile phone or tablet that can help you stick to your quit plan. There are many free apps, such as QuitGuide from the [employer] Office manager for Disease Control and Prevention). You can find more support from smokefree.gov and other websites. This information is not intended to replace advice given to you by your health care provider. Make sure you discuss any questions you have with your health care provider. Document Released: 01/31/2009 Document Revised: 12/03/2015 Document Reviewed: 08/21/2014 Elsevier Interactive Patient Education  2019 [employer].

## 2018-10-14 NOTE — Progress Notes (Signed)
Cardiology Clinic Note   Patient Name: Robyn Anderson Date of Encounter: 10/14/2018  Primary Care Provider:  Danae Orleans, MD Primary Cardiologist:  Robyn Burow, MD  Patient Profile    Robyn Anderson 53 year old female presents today for follow-up of her atypical chest pain.  Past Medical History    Past Medical History:  Diagnosis Date  . GERD (gastroesophageal reflux disease)   . Headache    migraines 1-2/mo  . Hypertension    Past Surgical History:  Procedure Laterality Date  . ATHERECTOMY Right    renal  . CARPAL TUNNEL RELEASE Left   . CARPAL TUNNEL RELEASE Right 09/27/2015   Procedure: OPEN CARPAL TUNNEL RELEASE RIGHT HAND;  Surgeon: Robyn Kail, MD;  Location: [place];  Service: Orthopedics;  Laterality: Right;  . CESAREAN SECTION     x2  . COLONOSCOPY    . ESOPHAGOGASTRODUODENOSCOPY    . TUBAL LIGATION      Allergies  Allergies  Allergen Reactions  . Atorvastatin Other (See Comments)    Muscle pain (generalized)  . Topamax [Topiramate]     tremors  . Vicodin [Hydrocodone-Acetaminophen] Nausea And Vomiting    History of Present Illness  Robyn Anderson was initially seen in the emergency department December 2019 for atypical chest pain and ruled out for MI.  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  Robyn Anderson then saw her in the office on 04/22/2018, she stated that she still had brief episodes lasting seconds to minutes of chest pain.  A CTA was performed to 08/08/2018, the coronary calcium score was 0 with no evidence of coronary artery disease.  She followed up on 07/15/2018 and still complained of brief episodes of chest pain.  Statin was prescribed and stopped due to intolerance  .  Robyn Anderson also prescribed amlodipine which helped her hypertension but caused lower extremity edema.  She is a half a pack a day smoker for the last 35 years and sees her primary doctor for hypertension and hypertriglyceridemia.   On Monday,  10/10/2018 Robyn Anderson called our office and stated that she had elevated blood pressures and was noticing some perioral numbness.  She presented to the emergency department was evaluated, received an EKG that is listed below and an MRI of her head without contrast that was stable and negative for infarct, hemorrhage, hydrocephalus, extra-axial collection or mass.  She states that she has had occasional episodes like this in the past but this was the most severe so she called our office and then presented to the emergency department.  She was discharged from emergency department and is scheduled to see a neurologist on Tuesday for follow-up evaluation of her symptoms.  Today she presents to the clinic and states that she has no shortness of breath, headaches, weakness, chest pain, palpitations, lower extremity edema, melena, hematuria, hemoptysis, orthopnea or PND.  She has a PMH of essential hypertension, hypertriglyceridemia, atypical chest pain, and tobacco abuse.   CT coronary 05/24/2018: IMPRESSION: 1. Calcium score 0  2.  Normal aortic root 2.6 cm  3. Essentially normal left dominant coronary arteries with minimal less than 30% soft plaque in the proximal LAD   Home Medications    Prior to Admission medications   Medication Sig Start Date End Date Taking? Authorizing Provider  amLODipine (NORVASC) 5 MG tablet Take 1 tablet (5 mg total) by mouth daily. 04/22/18 07/21/18  Robyn Harp, MD  atorvastatin (LIPITOR) 10 MG tablet Take  10 mg by mouth daily at 6 PM.    [provider]  Cholecalciferol (VITAMIN D) 50 MCG (2000 UT) tablet Take 2,000 Units by mouth daily.    [provider]  divalproex (DEPAKOTE ER) 500 MG 24 hr tablet Take 500 mg by mouth daily.  02/18/18 02/19/19  [provider]  lisinopril (ZESTRIL) 30 MG tablet Take 1 tablet (30 mg total) by mouth daily. 10/10/18 10/10/19  Robyn Sine, MD  omeprazole (PRILOSEC) 40 MG capsule Take 40 mg by mouth  daily. 03/21/18   [provider]    Family History    No family history on file. has no family status information on file.   Social History    Social History   Socioeconomic History  . Marital status: Single    Spouse name: Not on file  . Number of children: Not on file  . Years of education: Not on file  . Highest education level: Not on file  Occupational History  . Not on file  Social Needs  . Financial resource strain: Not on file  . Food insecurity    Worry: Not on file    Inability: Not on file  . Transportation needs    Medical: Not on file    Non-medical: Not on file  Tobacco Use  . Smoking status: Current Every Day Smoker    Packs/day: 0.50    Years: 30.00    Pack years: 15.00    Types: Cigarettes  . Smokeless tobacco: Never Used  Substance and Sexual Activity  . Alcohol use: No  . Drug use: No  . Sexual activity: Not on file  Lifestyle  . Physical activity    Days per week: Not on file    Minutes per session: Not on file  . Stress: Not on file  Relationships  . Social Herbalist on phone: Not on file    Gets together: Not on file    Attends religious service: Not on file    Active member of club or organization: Not on file    Attends meetings of clubs or organizations: Not on file    Relationship status: Not on file  . Intimate partner violence    Fear of current or ex partner: Not on file    Emotionally abused: Not on file    Physically abused: Not on file    Forced sexual activity: Not on file  Other Topics Concern  . Not on file  Social History Narrative  . Not on file     Review of Systems    General:  No chills, fever, night sweats or weight changes.  Cardiovascular:  No chest pain, dyspnea on exertion, edema, orthopnea, palpitations, paroxysmal nocturnal dyspnea. Dermatological: No rash, lesions/masses Respiratory: No cough, dyspnea Urologic: No hematuria, dysuria Abdominal:   No nausea, vomiting, diarrhea,  bright red blood per rectum, melena, or hematemesis Neurologic:  No visual changes, wkns, changes in mental status. All other systems reviewed and are otherwise negative except as noted above.  Physical Exam    VS:  BP 110/62   Pulse 84   Temp 97.9 F (36.6 C)   Ht 4\' 9"  (1.448 m)   Wt 149 lb (67.6 kg)   SpO2 98%   BMI 32.24 kg/m  , BMI Body mass index is 32.24 kg/m. GEN: Well nourished, well developed, in no acute distress. HEENT: normal. Neck: Supple, no JVD, carotid bruits, or masses. Cardiac: RRR, no murmurs, rubs, or  gallops. No clubbing, cyanosis, edema.  Radials/DP/PT 2+ and equal bilaterally.  Respiratory:  Respirations regular and unlabored, clear to auscultation bilaterally. GI: Soft, nontender, nondistended, BS + x 4. MS: no deformity or atrophy. Skin: warm and dry, no rash. Neuro:  Strength and sensation are intact. Psych: Normal affect.  Accessory Clinical Findings    ECG personally reviewed by me today-none today EKG 10/12/2018: Normal sinus rhythm, no T wave depression/elevation, 70 bpm  CT coronary 05/24/2018 IMPRESSION: 1. Calcium score 0  2.  Normal aortic root 2.6 cm  3. Essentially normal left dominant coronary arteries with minimal less than 30% soft plaque in the proximal LAD Assessment & Plan   1.  1. Atypical chest pain- no chest pain today Continue amlodipine 5 mg tablet daily Continue lisinopril 30 mg tablet daily Continue omeprazole 40 mg tablet daily Increase physical activity as tolerated Heart healthy diet 2.  Essential hypertension- well-controlled/followed by [place].D. Continue lisinopril 30 mg tablet daily Continue omeprazole 40 mg tablet daily Continue weight loss Low-sodium diet-educated about low-sodium food options 3.  Hyperlipidemia- last total cholesterol 204, triglycerides 444 (08/2011) Managed/monitored by PCP Increase physical activity and work on losing weight Stop smoking  Disposition: Follow-up  with Robyn Anderson in 1 year.  Deberah Pelton, NP 10/14/2018, 10:00 AM

## 2018-10-18 ENCOUNTER — Other Ambulatory Visit: Payer: Self-pay

## 2018-10-18 ENCOUNTER — Encounter: Payer: Self-pay | Admitting: Diagnostic Neuroimaging

## 2018-10-18 ENCOUNTER — Ambulatory Visit: Payer: Managed Care, Other (non HMO) | Admitting: Diagnostic Neuroimaging

## 2018-10-18 VITALS — BP 124/80 | HR 88 | Temp 98.4°F | Ht <= 58 in | Wt 149.0 lb

## 2018-10-18 DIAGNOSIS — G43109 Migraine with aura, not intractable, without status migrainosus: Secondary | ICD-10-CM

## 2018-10-18 MED ORDER — RIZATRIPTAN BENZOATE 10 MG PO TBDP: 10.0000 mg | ORAL_TABLET | ORAL | 11 refills | Status: DC | PRN

## 2018-10-18 MED ORDER — PROPRANOLOL HCL 20 MG PO TABS: 20.0000 mg | ORAL_TABLET | Freq: Two times a day (BID) | ORAL | 6 refills | Status: DC

## 2018-10-18 NOTE — Patient Instructions (Signed)
-   stop divalproex (not effective; causing weight gain)  - start propranolol 20mg  twice a day (migraine prevention)  - start rizatriptan 10 mg as needed (migraine rescue); may repeat times 1 after 2 hours; maximum 2/day or 8/month

## 2018-10-18 NOTE — Progress Notes (Signed)
[facility]  PATIENT: Robyn Anderson DOB: February 11, 1966  REFERRING CLINICIAN: ER  HISTORY FROM: patient  REASON FOR VISIT: new consult    HISTORICAL  CHIEF COMPLAINT:  Chief Complaint  Patient presents with  . Numbness    rm 7, New pt, "numbness of lips, left arm, tremor in my lips"    HISTORY OF PRESENT ILLNESS:   53 year old female here for evaluation of headaches and numbness.  Patient has long history of headaches with throbbing sensation over the top and left side of head, associated with numbness, sensitivity light, nausea and vomiting.  Patient averages 5 to 6 days of headache per month.  She is tried topiramate which caused tremor.  She tried Depakote which caused weight gain.  She tried amitriptyline which caused constipation.  She tried Imitrex which was not that effective cause sleepiness.  Patient went to the hospital few weeks ago due to headache and numbness.  MRI of the brain showed no acute findings.  Patient also has intermittent numbness and burning in the legs, as well as sensitivity of the scalp.   REVIEW OF SYSTEMS: Full 14 system review of systems performed and negative with exception of: Fatigue light sensitivity pain aching muscles cramps headache numbness tremors.   ALLERGIES: Allergies  Allergen Reactions  . Atorvastatin Other (See Comments)    Muscle pain (generalized)  . Topamax [Topiramate]     tremors  . Vicodin [Hydrocodone-Acetaminophen] Nausea And Vomiting    HOME MEDICATIONS: Outpatient Medications Prior to Visit  Medication Sig Dispense Refill  . Cholecalciferol (VITAMIN D) 50 MCG (2000 UT) tablet Take 2,000 Units by mouth daily.    . divalproex (DEPAKOTE ER) 500 MG 24 hr tablet Take 500 mg by mouth daily.     Marland Kitchen lisinopril (ZESTRIL) 30 MG tablet Take 1 tablet (30 mg total) by mouth daily. 90 tablet 1  . omeprazole (PRILOSEC) 40 MG capsule Take 40 mg by mouth daily.     No facility-administered medications prior  to visit.     PAST MEDICAL HISTORY: Past Medical History:  Diagnosis Date  . GERD (gastroesophageal reflux disease)   . Headache    migraines 1-2/mo  . Hypertension     PAST SURGICAL HISTORY: Past Surgical History:  Procedure Laterality Date  . ATHERECTOMY Right    renal  . CARPAL TUNNEL RELEASE Left   . CARPAL TUNNEL RELEASE Right 09/27/2015   Procedure: OPEN CARPAL TUNNEL RELEASE RIGHT HAND;  Surgeon: Leanor Kail, MD;  Location: [place];  Service: Orthopedics;  Laterality: Right;  . CESAREAN SECTION     x2  . COLONOSCOPY    . ESOPHAGOGASTRODUODENOSCOPY    . TUBAL LIGATION      FAMILY HISTORY: No family history on file.  SOCIAL HISTORY: Social History   Socioeconomic History  . Marital status: Single    Spouse name: Not on file  . Number of children: Not on file  . Years of education: Not on file  . Highest education level: Not on file  Occupational History    Comment: shift work  Social Needs  . Financial resource strain: Not on file  . Food insecurity    Worry: Not on file    Inability: Not on file  . Transportation needs    Medical: Not on file    Non-medical: Not on file  Tobacco Use  . Smoking status: Current Every Day Smoker    Packs/day: 0.50    Years: 30.00    Pack years:  15.00    Types: Cigarettes  . Smokeless tobacco: Never Used  Substance and Sexual Activity  . Alcohol use: No  . Drug use: No  . Sexual activity: Not on file  Lifestyle  . Physical activity    Days per week: Not on file    Minutes per session: Not on file  . Stress: Not on file  Relationships  . Social Herbalist on phone: Not on file    Gets together: Not on file    Attends religious service: Not on file    Active member of club or organization: Not on file    Attends meetings of clubs or organizations: Not on file    Relationship status: Not on file  . Intimate partner violence    Fear of current or ex partner: Not on file    Emotionally  abused: Not on file    Physically abused: Not on file    Forced sexual activity: Not on file  Other Topics Concern  . Not on file  Social History Narrative  . Not on file     PHYSICAL EXAM  GENERAL EXAM/CONSTITUTIONAL: Vitals:  Vitals:   10/18/18 1345  BP: 124/80  Pulse: 88  Temp: 98.4 F (36.9 C)  Weight: 149 lb (67.6 kg)  Height: 4\' 9"  (1.448 m)     Body mass index is 32.24 kg/m. Wt Readings from Last 3 Encounters:  10/18/18 149 lb (67.6 kg)  10/14/18 149 lb (67.6 kg)  07/15/18 145 lb (65.8 kg)     Patient is in no distress; well developed, nourished and groomed; neck is supple  CARDIOVASCULAR:  Examination of carotid arteries is normal; no carotid bruits  Regular rate and rhythm, no murmurs  Examination of peripheral vascular system by observation and palpation is normal  EYES:  Ophthalmoscopic exam of optic discs and posterior segments is normal; no papilledema or hemorrhages  No exam data present  MUSCULOSKELETAL:  Gait, strength, tone, movements noted in Neurologic exam below  NEUROLOGIC: MENTAL STATUS:  No flowsheet data found.  awake, alert, oriented to person, place and time  recent and remote memory intact  normal attention and concentration  language fluent, comprehension intact, naming intact  fund of knowledge appropriate  CRANIAL NERVE:   2nd - no papilledema on fundoscopic exam  2nd, 3rd, 4th, 6th - pupils equal and reactive to light, visual fields full to confrontation, extraocular muscles intact, no nystagmus  5th - facial sensation symmetric  7th - facial strength symmetric  8th - hearing intact  9th - palate elevates symmetrically, uvula midline  11th - shoulder shrug symmetric  12th - tongue protrusion midline  MOTOR:   normal bulk and tone, full strength in the BUE, BLE  SENSORY:   normal and symmetric to light touch, temperature, vibration  COORDINATION:   finger-nose-finger, fine finger movements  normal  REFLEXES:   deep tendon reflexes TRACE and symmetric  GAIT/STATION:   narrow based gait     DIAGNOSTIC DATA (LABS, IMAGING, TESTING) - I reviewed patient records, labs, notes, testing and imaging myself where available.  Lab Results  Component Value Date   WBC 10.0 10/10/2018   HGB 14.6 10/10/2018   HCT 43.0 10/10/2018   MCV 95.5 10/10/2018   PLT 303 10/10/2018      Component Value Date/Time   NA 138 10/10/2018 2008   NA 142 04/22/2018 1216   NA 141 02/24/2014 2310   K 4.2 10/10/2018 2008   K 3.9  02/24/2014 2310   CL 107 10/10/2018 2008   CL 113 (H) 02/24/2014 2310   CO2 22 10/10/2018 1951   CO2 24 02/24/2014 2310   GLUCOSE 100 (H) 10/10/2018 2008   GLUCOSE 86 02/24/2014 2310   BUN 13 10/10/2018 2008   BUN 10 04/22/2018 1216   BUN 13 02/24/2014 2310   CREATININE 0.80 10/10/2018 2008   CREATININE 0.89 02/24/2014 2310   CALCIUM 9.2 10/10/2018 1951   CALCIUM 7.7 (L) 02/24/2014 2310   PROT 6.1 (L) 10/10/2018 1951   PROT 6.4 02/24/2014 2310   ALBUMIN 3.2 (L) 10/10/2018 1951   ALBUMIN 3.2 (L) 02/24/2014 2310   AST 16 10/10/2018 1951   AST 26 02/24/2014 2310   ALT 14 10/10/2018 1951   ALT 30 02/24/2014 2310   ALKPHOS 48 10/10/2018 1951   ALKPHOS 72 02/24/2014 2310   BILITOT 0.2 (L) 10/10/2018 1951   BILITOT 0.1 (L) 02/24/2014 2310   GFRNONAA >60 10/10/2018 1951   GFRNONAA >60 02/24/2014 2310   GFRNONAA >60 10/01/2011 0925   GFRAA >60 10/10/2018 1951   GFRAA >60 02/24/2014 2310   GFRAA >60 10/01/2011 0925   Lab Results  Component Value Date   CHOL 204 (H) 09/08/2011   HDL 23 (L) 09/08/2011   LDLCALC SEE COMMENT 09/08/2011   TRIG 444 (H) 09/08/2011   No results found for: HGBA1C No results found for: VITAMINB12 Lab Results  Component Value Date   TSH 1.73 09/08/2011   09/05/18  HgbA1c 5.2 B12 460  02/14/18  TSH 1.563  10/11/18 MRI brain [I reviewed images myself and agree with interpretation. -VRP]  - Stable and negative brain MRI.     ASSESSMENT AND PLAN  53 y.o. year old female here with migraine with aura.  Dx:  1. Migraine with aura and without status migrainosus, not intractable      PLAN:  - stop divalproex (not effective; causing weight gain) - start propranolol 20mg  twice a day (migraine prevention) - start rizatriptan 10 mg as needed (migraine rescue)  Meds ordered this encounter  Medications  . propranolol (INDERAL) 20 MG tablet    Sig: Take 1 tablet (20 mg total) by mouth 2 (two) times daily.    Dispense:  60 tablet    Refill:  6  . rizatriptan (MAXALT-MLT) 10 MG disintegrating tablet    Sig: Take 1 tablet (10 mg total) by mouth as needed for migraine. May repeat in 2 hours if needed    Dispense:  9 tablet    Refill:  11    Return in about 3 months (around 01/18/2019) for with NP (Amy Lomax).    Penni Bombard, MD 06/27/4074, 8:08 PM Certified in Neurology, Neurophysiology and Neuroimaging  [facility] [address], [place], [place] [address] [phone]

## 2018-12-17 ENCOUNTER — Emergency Department (HOSPITAL_COMMUNITY)
Admission: EM | Admit: 2018-12-17 | Discharge: 2018-12-17 | Disposition: A | Discharge status: Home / Self Care | Payer: Managed Care, Other (non HMO) | Attending: Emergency Medicine | Admitting: Emergency Medicine

## 2018-12-17 ENCOUNTER — Encounter (HOSPITAL_COMMUNITY): Payer: Self-pay

## 2018-12-17 ENCOUNTER — Other Ambulatory Visit: Payer: Self-pay

## 2018-12-17 ENCOUNTER — Emergency Department (HOSPITAL_COMMUNITY): Payer: Managed Care, Other (non HMO)

## 2018-12-17 DIAGNOSIS — Z79899 Other long term (current) drug therapy: Secondary | ICD-10-CM | POA: Insufficient documentation

## 2018-12-17 DIAGNOSIS — N83201 Unspecified ovarian cyst, right side: Secondary | ICD-10-CM | POA: Diagnosis not present

## 2018-12-17 DIAGNOSIS — N83202 Unspecified ovarian cyst, left side: Secondary | ICD-10-CM | POA: Insufficient documentation

## 2018-12-17 DIAGNOSIS — R319 Hematuria, unspecified: Secondary | ICD-10-CM | POA: Insufficient documentation

## 2018-12-17 DIAGNOSIS — I10 Essential (primary) hypertension: Secondary | ICD-10-CM | POA: Insufficient documentation

## 2018-12-17 DIAGNOSIS — F17210 Nicotine dependence, cigarettes, uncomplicated: Secondary | ICD-10-CM | POA: Insufficient documentation

## 2018-12-17 LAB — CBC WITH DIFFERENTIAL/PLATELET
Abs Immature Granulocytes: 0.01 10*3/uL (ref 0.00–0.07)
Basophils Absolute: 0.1 10*3/uL (ref 0.0–0.1)
Basophils Relative: 1 %
Eosinophils Absolute: 0.2 10*3/uL (ref 0.0–0.5)
Eosinophils Relative: 2 %
HCT: 44.5 % (ref 36.0–46.0)
Hemoglobin: 15 g/dL (ref 12.0–15.0)
Immature Granulocytes: 0 %
Lymphocytes Relative: 45 %
Lymphs Abs: 4.1 10*3/uL — ABNORMAL HIGH (ref 0.7–4.0)
MCH: 32.3 pg (ref 26.0–34.0)
MCHC: 33.7 g/dL (ref 30.0–36.0)
MCV: 95.7 fL (ref 80.0–100.0)
Monocytes Absolute: 0.7 10*3/uL (ref 0.1–1.0)
Monocytes Relative: 7 %
Neutro Abs: 4.2 10*3/uL (ref 1.7–7.7)
Neutrophils Relative %: 45 %
Platelets: 285 10*3/uL (ref 150–400)
RBC: 4.65 MIL/uL (ref 3.87–5.11)
RDW: 13.2 % (ref 11.5–15.5)
WBC: 9.2 10*3/uL (ref 4.0–10.5)
nRBC: 0 % (ref 0.0–0.2)

## 2018-12-17 LAB — COMPREHENSIVE METABOLIC PANEL
ALT: 12 U/L (ref 0–44)
AST: 19 U/L (ref 15–41)
Albumin: 3.5 g/dL (ref 3.5–5.0)
Alkaline Phosphatase: 51 U/L (ref 38–126)
Anion gap: 10 (ref 5–15)
BUN: 8 mg/dL (ref 6–20)
CO2: 23 mmol/L (ref 22–32)
Calcium: 9.2 mg/dL (ref 8.9–10.3)
Chloride: 106 mmol/L (ref 98–111)
Creatinine, Ser: 0.91 mg/dL (ref 0.44–1.00)
GFR calc Af Amer: >60 mL/min (ref >60)
GFR calc non Af Amer: >60 mL/min (ref >60)
Glucose, Bld: 103 mg/dL — ABNORMAL HIGH (ref 70–99)
Potassium: 4.3 mmol/L (ref 3.5–5.1)
Sodium: 139 mmol/L (ref 135–145)
Total Bilirubin: 0.5 mg/dL (ref 0.3–1.2)
Total Protein: 6.5 g/dL (ref 6.5–8.1)

## 2018-12-17 LAB — URINALYSIS, ROUTINE W REFLEX MICROSCOPIC
Bilirubin Urine: NEGATIVE
Glucose, UA: NEGATIVE mg/dL
Ketones, ur: NEGATIVE mg/dL
Leukocytes,Ua: NEGATIVE
Nitrite: NEGATIVE
Protein, ur: NEGATIVE mg/dL
Specific Gravity, Urine: 1.024 (ref 1.005–1.030)
pH: 5 (ref 5.0–8.0)

## 2018-12-17 LAB — I-STAT BETA HCG BLOOD, ED (MC, WL, AP ONLY): I-stat hCG, quantitative: <5 m[IU]/mL (ref <5)

## 2018-12-17 LAB — LIPASE, BLOOD: Lipase: 32 U/L (ref 11–51)

## 2018-12-17 MED ORDER — KETOROLAC TROMETHAMINE 30 MG/ML IJ SOLN
30.0000 mg | Freq: Once | INTRAMUSCULAR | Status: AC
  Administered 2018-12-17: 30 mg via INTRAVENOUS
  Filled 2018-12-17: qty 1

## 2018-12-17 MED ORDER — FENTANYL CITRATE (PF) 100 MCG/2ML IJ SOLN
50.0000 ug | Freq: Once | INTRAMUSCULAR | Status: AC
  Administered 2018-12-17: 20:00:00 50 ug via INTRAVENOUS
  Filled 2018-12-17: qty 2

## 2018-12-17 MED ORDER — ONDANSETRON HCL 4 MG/2ML IJ SOLN
4.0000 mg | Freq: Once | INTRAMUSCULAR | Status: AC
  Administered 2018-12-17: 4 mg via INTRAVENOUS
  Filled 2018-12-17: qty 2

## 2018-12-17 MED ORDER — SODIUM CHLORIDE 0.9 % IV BOLUS
1000.0000 mL | Freq: Once | INTRAVENOUS | Status: AC
  Administered 2018-12-17: 1000 mL via INTRAVENOUS

## 2018-12-17 NOTE — ED Triage Notes (Signed)
Pt arrives c/o blood in the urine since waking up for work last night and pain in the LLQ and in her back. Pt called nurse line and was told to come here to rule out kidney stones or ruptured uterine cysts. Pt states that she has ovarian cysts on each side. Pain 7/10

## 2018-12-17 NOTE — ED Provider Notes (Signed)
[facility] EMERGENCY DEPARTMENT Provider Note   CSN: [id number] Arrival date & time: 12/17/18  1759    History   Chief Complaint Chief Complaint  Patient presents with   Hematuria    HPI Robyn Anderson is a 53 y.o. female with past medical history significant for hypertension, GERD, nephrolithiasis who presents for evaluation of flank pain.  Patient states she has bilateral flank pain left worse than right.  Symptoms intermittent in nature.  Patient states she will occasionally get left lower quadrant pain.  Patient states she has noticed dark urine with a "blood-tinged over the last 24 hours.  Has been taking ibuprofen intermittently for her pain.  She rates her pain a 7/10.  Pain does not radiate.  She does have history of ovarian cyst.  States her last menstrual cycle was approximately 1 year ago.  She is sexually active with her boyfriend and does not use protection.  Denies fever, chills, nausea, vomiting, chest pain, shortness of breath, vaginal discharge, abnormal vaginal bleeding, diarrhea, dysuria.  Denies additional aggravating or alleviating factors.  Patient states her flank pain has been there for "a long time" however she is more concerned with her left lower quadrant pain.  History obtained from patient and past medical records.  No interpreter was used.     HPI  Past Medical History:  Diagnosis Date   GERD (gastroesophageal reflux disease)    Headache    migraines 1-2/mo   Hypertension     Patient Active Problem List   Diagnosis Date Noted   Essential hypertension 04/22/2018   Hypertriglyceridemia 04/22/2018   Atypical chest pain 04/22/2018   Tobacco abuse 04/22/2018    Past Surgical History:  Procedure Laterality Date   ATHERECTOMY Right    renal   CARPAL TUNNEL RELEASE Left    CARPAL TUNNEL RELEASE Right 09/27/2015   Procedure: OPEN CARPAL TUNNEL RELEASE RIGHT HAND;  Surgeon: Leanor Kail, MD;  Location: [facility];  Service: Orthopedics;  Laterality: Right;   CESAREAN SECTION     x2   COLONOSCOPY     ESOPHAGOGASTRODUODENOSCOPY     TUBAL LIGATION       OB History   No obstetric history on file.      Home Medications    Prior to Admission medications   Medication Sig Start Date End Date Taking? Authorizing Provider  divalproex (DEPAKOTE ER) 500 MG 24 hr tablet Take 500 mg by mouth at bedtime. 08/08/18 08/08/19 Yes [provider]  lisinopril (ZESTRIL) 30 MG tablet Take 1 tablet (30 mg total) by mouth daily. 10/10/18 10/10/19 Yes Troy Sine, MD  omeprazole (PRILOSEC) 40 MG capsule Take 40 mg by mouth daily. 03/21/18  Yes [provider]    Family History History reviewed. No pertinent family history.  Social History Social History   Tobacco Use   Smoking status: Current Every Day Smoker    Packs/day: 0.50    Years: 30.00    Pack years: 15.00    Types: Cigarettes   Smokeless tobacco: Never Used  Substance Use Topics   Alcohol use: No   Drug use: No     Allergies   Atorvastatin, Topamax [topiramate], and Vicodin [hydrocodone-acetaminophen]   Review of Systems Review of Systems  Constitutional: Negative.   HENT: Negative.   Eyes: Negative.   Respiratory: Negative.   Cardiovascular: Negative.   Gastrointestinal: Positive for abdominal pain. Negative for abdominal distention, blood in stool, constipation, diarrhea, nausea and vomiting.  Genitourinary:  Positive for flank pain and hematuria. Negative for decreased urine volume, difficulty urinating, dysuria, frequency, menstrual problem, pelvic pain, urgency, vaginal bleeding, vaginal discharge and vaginal pain.  All other systems reviewed and are negative.    Physical Exam Updated Vital Signs BP 119/72    Pulse (!) 52    Temp 98 F (36.7 C) (Oral)    Resp 18    Ht 4\' 9"  (1.448 m)    Wt 65.8 kg    SpO2 99%    BMI 31.38 kg/m   Physical Exam Vitals signs and nursing note reviewed.    Constitutional:      General: She is not in acute distress.    Appearance: She is well-developed. She is not ill-appearing, toxic-appearing or diaphoretic.  HENT:     Head: Normocephalic and atraumatic.     Nose: Nose normal.     Mouth/Throat:     Mouth: Mucous membranes are moist.  Eyes:     Pupils: Pupils are equal, round, and reactive to light.  Neck:     Musculoskeletal: Normal range of motion.  Cardiovascular:     Rate and Rhythm: Normal rate.     Pulses: Normal pulses.     Heart sounds: Normal heart sounds.  Pulmonary:     Effort: Pulmonary effort is normal. No respiratory distress.     Breath sounds: Normal breath sounds.     Comments: Clear to auscultation without wheeze, rhonchi or rales.  Speaks in full sentences without difficulty. Abdominal:     General: Bowel sounds are normal. There is no distension.     Comments: Soft, that rebound or guarding.  She has minimal left lower quadrant tenderness as well as some mild bilateral flank tenderness left worse than right.  No vesicular lesions or overlying skin changes to abdominal wall or flanks.  Normoactive bowel sounds.  Musculoskeletal: Normal range of motion.     Comments: Moves all 4 extremities without difficulty.  2+ DP, PT pulses bilaterally.  Skin:    General: Skin is warm and dry.     Capillary Refill: Capillary refill takes less than 2 seconds.     Comments: No rashes or lesions.  Brisk capillary refill.  Neurological:     Mental Status: She is alert.     Comments: Cranial nerves II through XII grossly intact.  Ambulatory without difficulty.  No facial droop.      ED Treatments / Results  Labs (all labs ordered are listed, but only abnormal results are displayed) Labs Reviewed  CBC WITH DIFFERENTIAL/PLATELET - Abnormal; Notable for the following components:      Result Value   Lymphs Abs 4.1 (*)    All other components within normal limits  COMPREHENSIVE METABOLIC PANEL - Abnormal; Notable for the  following components:   Glucose, Bld 103 (*)    All other components within normal limits  URINALYSIS, ROUTINE W REFLEX MICROSCOPIC - Abnormal; Notable for the following components:   APPearance HAZY (*)    Hgb urine dipstick SMALL (*)    Bacteria, UA RARE (*)    All other components within normal limits  URINE CULTURE  LIPASE, BLOOD  I-STAT BETA HCG BLOOD, ED ([facility], [facility], [facility] ONLY)    EKG None  Radiology US Transvaginal Non-ob  Result Date: 12/17/2018 CLINICAL DATA:  53 year old female with left lower quadrant abdominal pain. EXAM: TRANSABDOMINAL AND TRANSVAGINAL ULTRASOUND OF PELVIS DOPPLER ULTRASOUND OF OVARIES TECHNIQUE: Both transabdominal and transvaginal ultrasound examinations of the pelvis were performed. Transabdominal  technique was performed for global imaging of the pelvis including uterus, ovaries, adnexal regions, and pelvic cul-de-sac. It was necessary to proceed with endovaginal exam following the transabdominal exam to visualize the endometrium and ovaries. Color and duplex Doppler ultrasound was utilized to evaluate blood flow to the ovaries. COMPARISON:  CT of the abdomen pelvis dated 12/17/2018 FINDINGS: Uterus Measurements: 8.1 x 4.4 x 4.8 cm = volume: 89 mL. There is a 1.1 x 1.3 x 1.0 cm left anterior body subserosal fibroid. There is trace fluid in the endocervical canal. Endometrium Thickness: 7 mm. Several echogenic foci may represent calcification or debris. Right ovary Measurements: 2.8 x 1.7 x 2.7 cm = volume: 7 mL. Normal appearance/no adnexal mass. Left ovary Measurements: 6.4 x 4.2 x 5.8 cm = volume: 80 mL. There is a 4.3 x 4.0 x 4.0 cm cyst in the left ovary. Pulsed Doppler evaluation of both ovaries demonstrates normal low-resistance arterial and venous waveforms. Other findings No abnormal free fluid. IMPRESSION: 1. Small left uterine subserosal fibroid. 2. Echogenic foci within the endometrium may represent calcification or debris. 3. Dominant ovarian cyst  bilaterally. Doppler detected bilateral ovarian flow. Electronically Signed   By: Anner Crete M.D.   On: 12/17/2018 22:05   US Pelvis Complete  Result Date: 12/17/2018 CLINICAL DATA:  53 year old female with left lower quadrant abdominal pain. EXAM: TRANSABDOMINAL AND TRANSVAGINAL ULTRASOUND OF PELVIS DOPPLER ULTRASOUND OF OVARIES TECHNIQUE: Both transabdominal and transvaginal ultrasound examinations of the pelvis were performed. Transabdominal technique was performed for global imaging of the pelvis including uterus, ovaries, adnexal regions, and pelvic cul-de-sac. It was necessary to proceed with endovaginal exam following the transabdominal exam to visualize the endometrium and ovaries. Color and duplex Doppler ultrasound was utilized to evaluate blood flow to the ovaries. COMPARISON:  CT of the abdomen pelvis dated 12/17/2018 FINDINGS: Uterus Measurements: 8.1 x 4.4 x 4.8 cm = volume: 89 mL. There is a 1.1 x 1.3 x 1.0 cm left anterior body subserosal fibroid. There is trace fluid in the endocervical canal. Endometrium Thickness: 7 mm. Several echogenic foci may represent calcification or debris. Right ovary Measurements: 2.8 x 1.7 x 2.7 cm = volume: 7 mL. Normal appearance/no adnexal mass. Left ovary Measurements: 6.4 x 4.2 x 5.8 cm = volume: 80 mL. There is a 4.3 x 4.0 x 4.0 cm cyst in the left ovary. Pulsed Doppler evaluation of both ovaries demonstrates normal low-resistance arterial and venous waveforms. Other findings No abnormal free fluid. IMPRESSION: 1. Small left uterine subserosal fibroid. 2. Echogenic foci within the endometrium may represent calcification or debris. 3. Dominant ovarian cyst bilaterally. Doppler detected bilateral ovarian flow. Electronically Signed   By: Anner Crete M.D.   On: 12/17/2018 22:05   [place] Art/ven Flow Abd Pelv Doppler  Result Date: 12/17/2018 CLINICAL DATA:  53 year old female with left lower quadrant abdominal pain. EXAM: TRANSABDOMINAL AND TRANSVAGINAL  ULTRASOUND OF PELVIS DOPPLER ULTRASOUND OF OVARIES TECHNIQUE: Both transabdominal and transvaginal ultrasound examinations of the pelvis were performed. Transabdominal technique was performed for global imaging of the pelvis including uterus, ovaries, adnexal regions, and pelvic cul-de-sac. It was necessary to proceed with endovaginal exam following the transabdominal exam to visualize the endometrium and ovaries. Color and duplex Doppler ultrasound was utilized to evaluate blood flow to the ovaries. COMPARISON:  CT of the abdomen pelvis dated 12/17/2018 FINDINGS: Uterus Measurements: 8.1 x 4.4 x 4.8 cm = volume: 89 mL. There is a 1.1 x 1.3 x 1.0 cm left anterior body subserosal fibroid. There is  trace fluid in the endocervical canal. Endometrium Thickness: 7 mm. Several echogenic foci may represent calcification or debris. Right ovary Measurements: 2.8 x 1.7 x 2.7 cm = volume: 7 mL. Normal appearance/no adnexal mass. Left ovary Measurements: 6.4 x 4.2 x 5.8 cm = volume: 80 mL. There is a 4.3 x 4.0 x 4.0 cm cyst in the left ovary. Pulsed Doppler evaluation of both ovaries demonstrates normal low-resistance arterial and venous waveforms. Other findings No abnormal free fluid. IMPRESSION: 1. Small left uterine subserosal fibroid. 2. Echogenic foci within the endometrium may represent calcification or debris. 3. Dominant ovarian cyst bilaterally. Doppler detected bilateral ovarian flow. Electronically Signed   By: Anner Crete M.D.   On: 12/17/2018 22:05   Ct Renal Stone Study  Result Date: 12/17/2018 CLINICAL DATA:  53 year old female with acute LEFT abdominal and flank pain with hematuria. EXAM: CT ABDOMEN AND PELVIS WITHOUT CONTRAST TECHNIQUE: Multidetector CT imaging of the abdomen and pelvis was performed following the standard protocol without IV contrast. COMPARISON:  09/07/2016 CT FINDINGS: Lower chest: No acute abnormality. Hepatobiliary: The liver and gallbladder are unremarkable except for unchanged  hepatic cysts. No biliary dilatation. Pancreas: Unremarkable Spleen: Unremarkable Adrenals/Urinary Tract: The kidneys, adrenal glands and bladder are unremarkable. Stomach/Bowel: Stomach is within normal limits. Appendix appears normal. No evidence of bowel wall thickening, distention, or inflammatory changes. Vascular/Lymphatic: Aortic atherosclerosis. No enlarged abdominal or pelvic lymph nodes. Reproductive: A 4 cm LEFT adnexal/ovarian cyst is present without adjacent inflammation or free fluid. The uterus and RIGHT adnexal region are unremarkable. Other: No ascites, focal collection or pneumoperitoneum. Musculoskeletal: No acute or suspicious bony abnormality. IMPRESSION: 1. 4 mm LEFT adnexal/ovarian cyst without adjacent inflammation or free fluid. Consider ultrasound if pain is referable to this area. 2.  Aortic Atherosclerosis (ICD10-I70.0). Electronically Signed   By: Margarette Canada M.D.   On: 12/17/2018 20:32    Procedures Procedures (including critical care time)  Medications Ordered in ED Medications  sodium chloride 0.9 % bolus 1,000 mL (0 mLs Intravenous Stopped 12/17/18 2234)  ondansetron (ZOFRAN) injection 4 mg (4 mg Intravenous Given 12/17/18 1938)  fentaNYL (SUBLIMAZE) injection 50 mcg (50 mcg Intravenous Given 12/17/18 1938)  ketorolac (TORADOL) 30 MG/ML injection 30 mg (30 mg Intravenous Given 12/17/18 2023)    Initial Impression / Assessment and Plan / ED Course  I have reviewed the triage vital signs and the nursing notes.  Pertinent labs & imaging results that were available during my care of the patient were reviewed by me and considered in my medical decision making (see chart for details).   53 year old female appears otherwise well presents for evaluation of hematuria.  She is afebrile, nonseptic, non-ill-appearing.  Has history of stones.  Has had some chronic bilateral flank pain and noticed yesterday that her urine was darker than normal.  She is also had some left lower  pelvic pain.  She does not have any diarrhea, melena, hematochezia.  She is sexually active however has no concerns for STDs.  She does note she has a history of bilateral ovarian cyst her left lower quadrant pain feels similar to.  Abdomen soft without rebound or guarding.  She does have some mild tenderness to her left lower quadrant, left pelvic region.  She declines pelvic exam at this time.  Discussed risk versus benefit.  She voices understanding.  Patient is sober and appears to have medical capacity to make decisions.  Will hold on pelvic exam.  She does have some minimal tenderness over her bilateral  flanks left worse than right however patient states this is chronic.  No overlying skin changes, specifically no vesicular changes or ecchymosis.  Will obtain labs, CT scan and reevaluate.  Labs and imaging personally reviewed CBC without leukocytosis Metabolic panel with mild elevation in glucose at 103, no additional electrolyte, renal or liver abnormalities Lipase 32 Pregnancy test negative Urinalysis with small blood, rare bacteria CT scan with left adnexal cyst, recommending ultrasound.  Will order ultrasound.  2040: Discussed CT results with patient.  She continues to decline pelvic exam.  Will order ultrasound.  Her pain is been well controlled.  She is tolerating p.o. intake in ED without difficulty.  No episodes of emesis.  2215: She continues to not have any pain.  She has had no episodes of emesis.  Ultrasound shows cyst without torsion.  Low suspicion for renal stone, dissection, aneurysm, zoster as cause of her chronic back pain.  She has no diarrhea, melena or hematochezia to suggest a diverticulitis as cause of her left lower quadrant pain.  We will have her follow-up with her PCP for hematuria.  Gastric return precautions.  Answered all questions.  Patient comfortable with plan.  We will have her follow-up with PCP return for any new worsening symptoms.  The patient has been  appropriately medically screened and/or stabilized in the ED. I have low suspicion for any other emergent medical condition which would require further screening, evaluation or treatment in the ED or require inpatient management.       Final Clinical Impressions(s) / ED Diagnoses   Final diagnoses:  Hematuria, unspecified type  Bilateral ovarian cysts    ED Discharge Orders    None       Mossie Gilder A, PA-C 12/17/18 2235    Malvin Johns, MD 12/18/18 1123

## 2018-12-17 NOTE — ED Notes (Addendum)
Pt ambulatory to the bathroom. And has a urine sample at the bedside.

## 2018-12-17 NOTE — ED Notes (Signed)
Discharge instructions and follow up with OBGYN discussed with pt. Pt verbalized understanding. No questions at this time

## 2018-12-17 NOTE — ED Notes (Signed)
Patient transported to Ultrasound 

## 2018-12-17 NOTE — ED Notes (Signed)
Patient transported to CT 

## 2018-12-17 NOTE — Discharge Instructions (Signed)
Take Tylenol and Ibuprofen as needed for pain. Follow up with Gyn for your ovarian cysts. Return for new or worsening symptoms.

## 2018-12-17 NOTE — ED Notes (Signed)
Istat recollected and sent to mini lab at this time

## 2018-12-19 LAB — URINE CULTURE: Culture: <10000 — AB

## 2019-01-18 ENCOUNTER — Ambulatory Visit: Payer: Managed Care, Other (non HMO) | Admitting: Family Medicine

## 2019-01-20 ENCOUNTER — Other Ambulatory Visit (HOSPITAL_COMMUNITY)
Admission: RE | Admit: 2019-01-20 | Discharge: 2019-01-20 | Disposition: A | Discharge status: Home / Self Care | Payer: Managed Care, Other (non HMO) | Source: Ambulatory Visit | Attending: Obstetrics and Gynecology | Admitting: Obstetrics and Gynecology

## 2019-01-20 ENCOUNTER — Ambulatory Visit (INDEPENDENT_AMBULATORY_CARE_PROVIDER_SITE_OTHER): Payer: Managed Care, Other (non HMO) | Admitting: Obstetrics and Gynecology

## 2019-01-20 ENCOUNTER — Other Ambulatory Visit: Payer: Self-pay

## 2019-01-20 ENCOUNTER — Encounter: Payer: Self-pay | Admitting: Obstetrics and Gynecology

## 2019-01-20 VITALS — BP 144/61 | HR 82 | Temp 97.7°F | Wt 145.0 lb

## 2019-01-20 DIAGNOSIS — N950 Postmenopausal bleeding: Secondary | ICD-10-CM | POA: Diagnosis not present

## 2019-01-20 DIAGNOSIS — Z1151 Encounter for screening for human papillomavirus (HPV): Secondary | ICD-10-CM | POA: Diagnosis not present

## 2019-01-20 DIAGNOSIS — Z8742 Personal history of other diseases of the female genital tract: Secondary | ICD-10-CM

## 2019-01-20 DIAGNOSIS — Z124 Encounter for screening for malignant neoplasm of cervix: Secondary | ICD-10-CM | POA: Diagnosis not present

## 2019-01-20 DIAGNOSIS — Z113 Encounter for screening for infections with a predominantly sexual mode of transmission: Secondary | ICD-10-CM | POA: Diagnosis not present

## 2019-01-20 HISTORY — DX: Postmenopausal bleeding: N95.0

## 2019-01-20 NOTE — Procedures (Signed)
Endometrial Biopsy Procedure Note  Pre-operative Diagnosis: Postmenopausal bleeding  Post-operative Diagnosis: same. Atrophic endometrium  Procedure Details  Urine pregnancy test was not done.  The risks (including infection, bleeding, pain, and uterine perforation) and benefits of the procedure were explained to the patient and Written informed consent was obtained.  The patient was placed in the dorsal lithotomy position.  Bimanual exam showed the uterus to be in the neutral position.  A Pederson speculum inserted in the vagina, and the cervix was visualized and a pap smear performed. The cervix was then prepped with povidone iodine, and a sharp tenaculum was applied to the anterior lip of the cervix for stabilization.  A pipelle was inserted into the uterine cavity and sounded the uterus to a depth of 6cm.  A Minimal amount of tissue was collected after 3 passes. The sample was sent for pathologic examination.  Condition: Stable  Complications: None  Plan: The patient was advised to call for any fever or for prolonged or severe pain or bleeding. She was advised to use OTC analgesics as needed for mild to moderate pain. She was advised to avoid vaginal intercourse for 48 hours or until the bleeding has completely stopped.  Durene Romans MD Attending Center for [employer])

## 2019-01-20 NOTE — Progress Notes (Signed)
Obstetrics and Gynecology New Patient Referal Evaluation  Appointment Date: 01/20/2019  OBGYN Clinic: [facility]  Primary Care Provider: Danae Orleans  Referring Provider: [facility]  Chief Complaint: postmenopausal bleeding and pain  History of Present Illness: Robyn Anderson is a 53 y.o. Caucasian G2P2 (No LMP recorded. Patient is premenopausal.), seen for the above chief complaint. Her past medical history is significant for HTN, h/o c-section x 2, BTL, BMI 30s, tobacco  Patient went to the ED on 8/29 for flank pain and ?hematuria. Had u/s and CT scan with below results; pt declined pelvic exam. Negative I-stat beta hcg, lipase, cbc, cmp, ucx. U/a did show small blood and pt discharged to have f/u with GYN  Patient states that she had pink discharge a few days before going to the ED. On the day she went to ED, she had bad flank pain and that's what brought her to ED. Pt fairly certain d/c is vaginal   Currently, has occasional, slight cramping/lightening pains  No vaginal bleeding, pain, blood in BMs  Review of Systems: as noted in the History of Present Illness.   Past Medical History:  Past Medical History:  Diagnosis Date  . GERD (gastroesophageal reflux disease)   . Headache    migraines 1-2/mo  . Hypertension   . Obesity (BMI 30-39.9)     Past Surgical History:  Past Surgical History:  Procedure Laterality Date  . ATHERECTOMY Right    renal  . CARPAL TUNNEL RELEASE Left   . CARPAL TUNNEL RELEASE Right 09/27/2015   Procedure: OPEN CARPAL TUNNEL RELEASE RIGHT HAND;  Surgeon: Leanor Kail, MD;  Location: [place];  Service: Orthopedics;  Laterality: Right;  . CESAREAN SECTION     x2  . COLONOSCOPY    . ESOPHAGOGASTRODUODENOSCOPY    . TUBAL LIGATION      Past Obstetrical History:  OB History  Gravida Para Term Preterm AB Living  2 2       2   SAB TAB Ectopic Multiple Live Births          2    # Outcome Date GA Lbr  Len/2nd Weight Sex Delivery Anes PTL Lv  2 Para      CS-LTranv     1 Para      CS-LTranv       Past Gynecological History: As per HPI. Periods: stopped April 2019 History of Pap Smear(s): Yes.   Last pap unsure. No h/o abnormals History of HRT use: No.  Social History:  Social History   Socioeconomic History  . Marital status: Single    Spouse name: Not on file  . Number of children: Not on file  . Years of education: Not on file  . Highest education level: Not on file  Occupational History    Comment: shift work  Social Needs  . Financial resource strain: Not on file  . Food insecurity    Worry: Not on file    Inability: Not on file  . Transportation needs    Medical: Not on file    Non-medical: Not on file  Tobacco Use  . Smoking status: Current Every Day Smoker    Packs/day: 0.50    Years: 30.00    Pack years: 15.00    Types: Cigarettes  . Smokeless tobacco: Never Used  Substance and Sexual Activity  . Alcohol use: No  . Drug use: No  . Sexual activity: Not on file  Lifestyle  . Physical activity  Days per week: Not on file    Minutes per session: Not on file  . Stress: Not on file  Relationships  . Social Herbalist on phone: Not on file    Gets together: Not on file    Attends religious service: Not on file    Active member of club or organization: Not on file    Attends meetings of clubs or organizations: Not on file    Relationship status: Not on file  . Intimate partner violence    Fear of current or ex partner: Not on file    Emotionally abused: Not on file    Physically abused: Not on file    Forced sexual activity: Not on file  Other Topics Concern  . Not on file  Social History Narrative  . Not on file    Family History: No family history on file.  Health Maintenance:  Mammogram(s): Yes.   Date: 2019, neg Colonoscopy: Yes.   Date: 2018, neg  Medications Anda Latina had no medications administered during this  visit. Current Outpatient Medications  Medication Sig Dispense Refill  . divalproex (DEPAKOTE ER) 500 MG 24 hr tablet Take 500 mg by mouth at bedtime.    Marland Kitchen lisinopril (ZESTRIL) 30 MG tablet Take 1 tablet (30 mg total) by mouth daily. 90 tablet 1  . omeprazole (PRILOSEC) 40 MG capsule Take 40 mg by mouth daily.    . pregabalin (LYRICA) 25 MG capsule Take 25 mg by mouth 2 (two) times daily.    . SUMAtriptan (IMITREX) 25 MG tablet Take 25 mg by mouth every 2 (two) hours as needed for migraine. May repeat in 2 hours if headache persists or recurs.     No current facility-administered medications for this visit.     Allergies Atorvastatin, Topamax [topiramate], and Vicodin [hydrocodone-acetaminophen]   Physical Exam:  BP (!) 144/61   Pulse 82   Temp 97.7 F (36.5 C)   Wt 145 lb (65.8 kg)   BMI 31.38 kg/m  Body mass index is 31.38 kg/m. General appearance: Well nourished, well developed female in no acute distress.  Neck:  Supple, normal appearance, and no thyromegaly  Cardiovascular: normal s1 and s2.  No murmurs, rubs or gallops. Respiratory:  Clear to auscultation bilateral. Normal respiratory effort Abdomen: positive bowel sounds and no masses, hernias; diffusely non tender to palpation, non distended. Well healed vertical midline incision Neuro/Psych:  Normal mood and affect.  Skin:  Warm and dry.  Lymphatic:  No inguinal lymphadenopathy.   Pelvic exam: is limited by body habitus EGBUS: within normal limits, Vagina: within normal limits and with no blood or discharge in the vault, Cervix: normal appearing cervix without tenderness, discharge or lesions. Uterus:  nonenlarged and non tender and Adnexa:  normal adnexa and no mass, fullness, tenderness Rectovaginal: deferred  Laboratory: as per HPI  Radiology:  CLINICAL DATA:  53 year old female with acute LEFT abdominal and flank pain with hematuria.  EXAM: CT ABDOMEN AND PELVIS WITHOUT CONTRAST  TECHNIQUE: Multidetector  CT imaging of the abdomen and pelvis was performed following the standard protocol without IV contrast.  COMPARISON:  09/07/2016 CT  FINDINGS: Lower chest: No acute abnormality.  Hepatobiliary: The liver and gallbladder are unremarkable except for unchanged hepatic cysts. No biliary dilatation.  Pancreas: Unremarkable  Spleen: Unremarkable  Adrenals/Urinary Tract: The kidneys, adrenal glands and bladder are unremarkable.  Stomach/Bowel: Stomach is within normal limits. Appendix appears normal. No evidence of bowel wall thickening, distention, or  inflammatory changes.  Vascular/Lymphatic: Aortic atherosclerosis. No enlarged abdominal or pelvic lymph nodes.  Reproductive: A 4 cm LEFT adnexal/ovarian cyst is present without adjacent inflammation or free fluid. The uterus and RIGHT adnexal region are unremarkable.  Other: No ascites, focal collection or pneumoperitoneum.  Musculoskeletal: No acute or suspicious bony abnormality.  IMPRESSION: 1. 4 mm LEFT adnexal/ovarian cyst without adjacent inflammation or free fluid. Consider ultrasound if pain is referable to this area. 2.  Aortic Atherosclerosis (ICD10-I70.0).   Electronically Signed   By: Margarette Canada M.D.   On: 12/17/2018 20:32  CLINICAL DATA:  53 year old female with left lower quadrant abdominal pain.  EXAM: TRANSABDOMINAL AND TRANSVAGINAL ULTRASOUND OF PELVIS  DOPPLER ULTRASOUND OF OVARIES  TECHNIQUE: Both transabdominal and transvaginal ultrasound examinations of the pelvis were performed. Transabdominal technique was performed for global imaging of the pelvis including uterus, ovaries, adnexal regions, and pelvic cul-de-sac.  It was necessary to proceed with endovaginal exam following the transabdominal exam to visualize the endometrium and ovaries. Color and duplex Doppler ultrasound was utilized to evaluate blood flow to the ovaries.  COMPARISON:  CT of the abdomen pelvis dated  12/17/2018  FINDINGS: Uterus  Measurements: 8.1 x 4.4 x 4.8 cm = volume: 89 mL. There is a 1.1 x 1.3 x 1.0 cm left anterior body subserosal fibroid. There is trace fluid in the endocervical canal.  Endometrium  Thickness: 7 mm. Several echogenic foci may represent calcification or debris.  Right ovary  Measurements: 2.8 x 1.7 x 2.7 cm = volume: 7 mL. Normal appearance/no adnexal mass.  Left ovary  Measurements: 6.4 x 4.2 x 5.8 cm = volume: 80 mL. There is a 4.3 x 4.0 x 4.0 cm cyst in the left ovary.  Pulsed Doppler evaluation of both ovaries demonstrates normal low-resistance arterial and venous waveforms.  Other findings  No abnormal free fluid.  IMPRESSION: 1. Small left uterine subserosal fibroid. 2. Echogenic foci within the endometrium may represent calcification or debris. 3. Dominant ovarian cyst bilaterally. Doppler detected bilateral ovarian flow.   Electronically Signed   By: Anner Crete M.D.   On: 12/17/2018 22:05  Assessment: pt stable  Plan:  1. History of vaginal bleeding Pap smear, STI testing done today as well as embx.   2. History of ovarian cyst Images reviewed and appear simple and non concerning. Will get CA 125 and repeat u/s in a few weeks to see if cysts are still there and if so if any change.  - CA 125 - US PELVIS TRANSVAGINAL NON-OB (TV ONLY); Future  RTC after u/s  Durene Romans MD Attending Center for [facility])

## 2019-01-21 LAB — CA 125: Cancer Antigen (CA) 125: 5 U/mL (ref 0.0–38.1)

## 2019-01-23 LAB — SURGICAL PATHOLOGY

## 2019-01-25 LAB — CYTOLOGY - PAP
Adequacy: ABSENT
Chlamydia: NEGATIVE
Diagnosis: NEGATIVE
High risk HPV: NEGATIVE
Neisseria Gonorrhea: NEGATIVE
Trichomonas: NEGATIVE

## 2019-02-06 ENCOUNTER — Ambulatory Visit (HOSPITAL_COMMUNITY)
Admission: RE | Admit: 2019-02-06 | Discharge: 2019-02-06 | Disposition: A | Discharge status: Home / Self Care | Payer: Managed Care, Other (non HMO) | Source: Ambulatory Visit | Attending: Obstetrics and Gynecology | Admitting: Obstetrics and Gynecology

## 2019-02-06 ENCOUNTER — Other Ambulatory Visit: Payer: Self-pay

## 2019-02-06 DIAGNOSIS — Z8742 Personal history of other diseases of the female genital tract: Secondary | ICD-10-CM | POA: Insufficient documentation

## 2019-03-06 ENCOUNTER — Other Ambulatory Visit: Payer: Self-pay | Admitting: Cardiovascular Disease

## 2019-03-08 ENCOUNTER — Ambulatory Visit: Payer: Managed Care, Other (non HMO) | Admitting: Obstetrics and Gynecology

## 2019-03-31 ENCOUNTER — Other Ambulatory Visit: Payer: Self-pay

## 2019-03-31 ENCOUNTER — Ambulatory Visit (INDEPENDENT_AMBULATORY_CARE_PROVIDER_SITE_OTHER): Payer: Managed Care, Other (non HMO) | Admitting: Obstetrics and Gynecology

## 2019-03-31 ENCOUNTER — Encounter: Payer: Self-pay | Admitting: Obstetrics and Gynecology

## 2019-03-31 VITALS — BP 128/95 | HR 69 | Temp 98.0°F | Ht <= 58 in | Wt 151.0 lb

## 2019-03-31 DIAGNOSIS — N83202 Unspecified ovarian cyst, left side: Secondary | ICD-10-CM | POA: Diagnosis not present

## 2019-03-31 DIAGNOSIS — N950 Postmenopausal bleeding: Secondary | ICD-10-CM

## 2019-03-31 DIAGNOSIS — N882 Stricture and stenosis of cervix uteri: Secondary | ICD-10-CM

## 2019-03-31 NOTE — Progress Notes (Signed)
Obstetrics and Gynecology Established Patient Follow Up Evaluation  Appointment Date: 03/31/2019  OBGYN Clinic: [facility]  Primary Care Provider: Danae Orleans [facility])  Chief Complaint: ultrasound and postmenopausal bleeding follow up  History of Present Illness: Robyn Anderson is a 53 y.o. Caucasian G2P2 (LMP: 07/2017), seen for the above chief complaint. Her past medical history is significant for left ovarian cyst (simple),  HTN, Barrett's esophagus, CAD, c-section x 2, BTL, tobacco  She was seen by me on 10/2 for new patient eval for PMB and pain: Patient went to the ED on 8/29 for flank pain and ?hematuria. Had u/s and CT scan with below results; pt declined pelvic exam. Negative I-stat beta hcg, lipase, cbc, cmp, ucx. U/a did show small blood and pt discharged to have f/u with GYN  Patient states that she had pink discharge a few days before going to the ED. On the day she went to ED, she had bad flank pain and that's what brought her to ED. Pt fairly certain d/c is vaginal   Exam negative (she had suspected cx stenosis that was confirmed on pathology). Her pap smear and hpv was negative and embx only showed benign cervical cells. Plan was to set her up for rpt u/s to follow the left simple ovarian cyst  Interval History: she said she had some spotting with wiping (no pain) about 3-4wks ago. No climateric s/s. She does endorse some vague lower abdominal discomfort at times  Review of Systems:  as noted in the History of Present Illness.  Past Medical History:  Past Medical History:  Diagnosis Date  . Barrett esophagus   . GERD (gastroesophageal reflux disease)   . Headache    migraines 1-2/mo  . Hypertension   . Obesity (BMI 30-39.9)     Past Surgical History:  Past Surgical History:  Procedure Laterality Date  . ATHERECTOMY Right    renal  . CARPAL TUNNEL RELEASE Left   . CARPAL TUNNEL RELEASE Right 09/27/2015   Procedure: OPEN CARPAL  TUNNEL RELEASE RIGHT HAND;  Surgeon: Leanor Kail, MD;  Location: [place];  Service: Orthopedics;  Laterality: Right;  . CESAREAN SECTION     x2  . COLONOSCOPY    . ESOPHAGOGASTRODUODENOSCOPY    . TUBAL LIGATION      Past Obstetrical History:  OB History  Gravida Para Term Preterm AB Living  2 2       2   SAB TAB Ectopic Multiple Live Births          2    # Outcome Date GA Lbr Len/2nd Weight Sex Delivery Anes PTL Lv  2 Para      CS-LTranv     1 Para      CS-LTranv       Past Gynecological History: As per HPI.  Social History:  Social History   Socioeconomic History  . Marital status: Single    Spouse name: Not on file  . Number of children: Not on file  . Years of education: Not on file  . Highest education level: Not on file  Occupational History    Comment: shift work  Tobacco Use  . Smoking status: Current Every Day Smoker    Packs/day: 0.50    Years: 30.00    Pack years: 15.00    Types: Cigarettes  . Smokeless tobacco: Never Used  Substance and Sexual Activity  . Alcohol use: No  . Drug use: No  . Sexual activity: Yes  Birth control/protection: Surgical  Other Topics Concern  . Not on file  Social History Narrative  . Not on file   Social Determinants of Health   Financial Resource Strain:   . Difficulty of Paying Living Expenses: Not on file  Food Insecurity:   . Worried About Charity fundraiser in the Last Year: Not on file  . Ran Out of Food in the Last Year: Not on file  Transportation Needs:   . Lack of Transportation (Medical): Not on file  . Lack of Transportation (Non-Medical): Not on file  Physical Activity:   . Days of Exercise per Week: Not on file  . Minutes of Exercise per Session: Not on file  Stress:   . Feeling of Stress : Not on file  Social Connections:   . Frequency of Communication with Friends and Family: Not on file  . Frequency of Social Gatherings with Friends and Family: Not on file  . Attends Religious  Services: Not on file  . Active Member of Clubs or Organizations: Not on file  . Attends Archivist Meetings: Not on file  . Marital Status: Not on file  Intimate Partner Violence:   . Fear of Current or Ex-Partner: Not on file  . Emotionally Abused: Not on file  . Physically Abused: Not on file  . Sexually Abused: Not on file    Family History: No family history on file.  Health Maintenance: Colonoscopy-2018, repeat 5 years Mammogram-2020, repeat 1 year  Medications Anda Latina had no medications administered during this visit. Current Outpatient Medications  Medication Sig Dispense Refill  . lisinopril (ZESTRIL) 30 MG tablet TAKE 1 TABLET BY MOUTH EVERY DAY 90 tablet 1  . nortriptyline (PAMELOR) 10 MG capsule Take 10 mg by mouth at bedtime. 1 capsule by mouth nightly for one week then increase to 2 capsules nightly    . omeprazole (PRILOSEC) 40 MG capsule Take 40 mg by mouth daily.    . SUMAtriptan (IMITREX) 25 MG tablet Take 25 mg by mouth every 2 (two) hours as needed for migraine. May repeat in 2 hours if headache persists or recurs.     No current facility-administered medications for this visit.    Allergies Atorvastatin, Topamax [topiramate], and Vicodin [hydrocodone-acetaminophen]   Physical Exam:  BP (!) 128/95   Pulse 69   Temp 98 F (36.7 C)   Ht 4\' 9"  (1.448 m)   Wt 151 lb (68.5 kg)   BMI 32.68 kg/m  Body mass index is 32.68 kg/m.  General appearance: Well nourished, well developed female in no acute distress.   Laboratory: as per HPI  Radiology:  CLINICAL DATA:  Vaginal bleeding, history ovarian cyst, follow-up ovarian cysts  EXAM: ULTRASOUND PELVIS TRANSVAGINAL  TECHNIQUE: Transvaginal ultrasound examination of the pelvis was performed including evaluation of the uterus, ovaries, adnexal regions, and pelvic cul-de-sac.  COMPARISON:  12/17/2018  FINDINGS: Uterus  Measurements: 8.5 x 4.5 x 6.1 cm = volume: 122 mL.  Anteverted. Anterior wall Caesarean section scar. Prominent nabothian cyst at cervix. Heterogeneous myometrium with a single small anterior LEFT lateral intramural leiomyoma 1.3 x 1.3 x 1.3 cm.  Endometrium  Thickness: 4 mm. Small amount of nonspecific endometrial fluid at mid uterus, question related to prior Caesarean section. Otherwise unremarkable.  Right ovary  Measurements: 3.9 x 2.9 x 3.3 cm = volume: 19.4 mL. Dominant follicle without mass  Left ovary  Measurements: 5.2 x 3.2 x 2.4 cm = volume: 20.8 mL. Small cyst 3.8  x 3.6 x 4.0 cm, simple features. No additional masses.  Other findings:  Trace free pelvic fluid.  No other adnexal masses.  IMPRESSION: Small simple appearing LEFT ovarian cyst 4.0 cm greatest size.  Small amount of nonspecific endometrial fluid at mid uterus, potentially related to Caesarean section scar.  Small intramural leiomyoma anterior LEFT uterus 1.3 cm diameter.   Electronically Signed   By: Lavonia Dana M.D.   On: 02/06/2019 08:52  Assessment: pt stable  Plan:  1. Postmenopausal bleeding I still recommend that she needs tissue uterine pathology. Given that she is having lower abdominal discomfort, I recommend laparoscopy to evaluate the ovaries, so I recommend doing hysteroscopy, d&c at the same time. I talked to her about at her age the data appears to be equivocal about removal of ovaries. R/B d/w her and she'd like to remove both ovaries, and I recommend removal of both fallopian tubes at the same time, too  Request sent for hysteroscopy, d&c, laparoscopic bilateral salpingo-oophrectomy.   Will inbasket cardiology to make sure no need for pre op eval.  2. Cervical stenosis (uterine cervix)  3. Left ovarian cyst See above  RTC post op  Durene Romans MD Attending Center for [facility])

## 2019-04-06 ENCOUNTER — Encounter: Payer: Self-pay | Admitting: Cardiology

## 2019-04-06 ENCOUNTER — Other Ambulatory Visit: Payer: Self-pay

## 2019-04-06 ENCOUNTER — Ambulatory Visit: Payer: Managed Care, Other (non HMO) | Admitting: Cardiology

## 2019-04-06 VITALS — BP 128/82 | HR 82 | Temp 97.2°F | Ht <= 58 in | Wt 148.0 lb

## 2019-04-06 DIAGNOSIS — I10 Essential (primary) hypertension: Secondary | ICD-10-CM

## 2019-04-06 DIAGNOSIS — G629 Polyneuropathy, unspecified: Secondary | ICD-10-CM

## 2019-04-06 DIAGNOSIS — Z01818 Encounter for other preprocedural examination: Secondary | ICD-10-CM

## 2019-04-06 DIAGNOSIS — E781 Pure hyperglyceridemia: Secondary | ICD-10-CM

## 2019-04-06 DIAGNOSIS — R0789 Other chest pain: Secondary | ICD-10-CM | POA: Diagnosis not present

## 2019-04-06 DIAGNOSIS — G43109 Migraine with aura, not intractable, without status migrainosus: Secondary | ICD-10-CM | POA: Diagnosis not present

## 2019-04-06 DIAGNOSIS — Z720 Tobacco use: Secondary | ICD-10-CM | POA: Diagnosis not present

## 2019-04-06 DIAGNOSIS — Z87898 Personal history of other specified conditions: Secondary | ICD-10-CM

## 2019-04-06 NOTE — Patient Instructions (Addendum)
Medication Instructions:  Your physician recommends that you continue on your current medications as directed. Please refer to the Current Medication list given to you today. *If you need a refill on your cardiac medications before your next appointment, please call your pharmacy*  Lab Work: None  If you have labs (blood work) drawn today and your tests are completely normal, you will receive your results only by: Marland Kitchen MyChart Message (if you have MyChart) OR . A paper copy in the mail If you have any lab test that is abnormal or we need to change your treatment, we will call you to review the results.  Testing/Procedures: None   Follow-Up: At [facility], you and your health needs are our priority.  As part of our continuing mission to provide you with exceptional heart care, we have created designated Provider Care Teams.  These Care Teams include your primary Cardiologist (physician) and Advanced Practice Providers (APPs -  Physician Assistants and Nurse Practitioners) who all work together to provide you with the care you need, when you need it.  Your next appointment:   FOLLOW UP ON A AS NEEDED BASIS  The format for your next appointment:   In Person  Provider:   Quay Burow, MD  Other Instructions Will request pre-op clearance from Dr Ilda Basset

## 2019-04-06 NOTE — Progress Notes (Signed)
Cardiology Office Note:    Date:  04/06/2019   ID:  EUPHA SLAVEY, DOB 10/26/1965, MRN [id number]  PCP:  Danae Orleans, MD  Cardiologist:  Quay Burow, MD  Electrophysiologist:  None   Referring MD: Danae Orleans, MD   No chief complaint on file. Pre op clearance  History of Present Illness:    Robyn Anderson is a 53 y.o. female, works nights,  with a hx of atypical chest pain.  She had a coronary CTA in Feb 2020 that showed no coronary disease and a calcium score of 0.  Other medical issues include treated hypertension, hypertriglyceridemia followed by her PCP, migraines, and lower extremity neuropathy.  She is seen in the office today for preop clearance prior to hysterectomy.  The patient is done well from a cardiac standpoint.  She is now being followed through the [place] [facility] and [facility] for her migraine and lower extremity neuropathy.  She denies any unusual chest pain or shortness of breath.  Past Medical History:  Diagnosis Date  . Barrett esophagus   . Coronary artery disease   . GERD (gastroesophageal reflux disease)   . Headache    migraines 1-2/mo  . Hypertension   . Obesity (BMI 30-39.9)     Past Surgical History:  Procedure Laterality Date  . ATHERECTOMY Right    renal  . CARPAL TUNNEL RELEASE Left   . CARPAL TUNNEL RELEASE Right 09/27/2015   Procedure: OPEN CARPAL TUNNEL RELEASE RIGHT HAND;  Surgeon: Leanor Kail, MD;  Location: [facility];  Service: Orthopedics;  Laterality: Right;  . CESAREAN SECTION     x2  . COLONOSCOPY    . ESOPHAGOGASTRODUODENOSCOPY    . TUBAL LIGATION      Current Medications: Current Meds  Medication Sig  . lisinopril (ZESTRIL) 30 MG tablet TAKE 1 TABLET BY MOUTH EVERY DAY  . nortriptyline (PAMELOR) 10 MG capsule Take 10 mg by mouth at bedtime. 1 capsule by mouth nightly for one week then increase to 2 capsules nightly  . omeprazole (PRILOSEC) 40 MG capsule Take 40 mg by mouth daily.  . SUMAtriptan (IMITREX)  25 MG tablet Take 25 mg by mouth every 2 (two) hours as needed for migraine. May repeat in 2 hours if headache persists or recurs.     Allergies:   Atorvastatin, Topamax [topiramate], and Vicodin [hydrocodone-acetaminophen]   Social History   Socioeconomic History  . Marital status: Single    Spouse name: Not on file  . Number of children: Not on file  . Years of education: Not on file  . Highest education level: Not on file  Occupational History    Comment: shift work  Tobacco Use  . Smoking status: Current Every Day Smoker    Packs/day: 0.50    Years: 30.00    Pack years: 15.00    Types: Cigarettes  . Smokeless tobacco: Never Used  Substance and Sexual Activity  . Alcohol use: No  . Drug use: No  . Sexual activity: Yes    Birth control/protection: Surgical  Other Topics Concern  . Not on file  Social History Narrative  . Not on file   Social Determinants of Health   Financial Resource Strain:   . Difficulty of Paying Living Expenses: Not on file  Food Insecurity:   . Worried About Charity fundraiser in the Last Year: Not on file  . Ran Out of Food in the Last Year: Not on file  Transportation Needs:   .  Lack of Transportation (Medical): Not on file  . Lack of Transportation (Non-Medical): Not on file  Physical Activity:   . Days of Exercise per Week: Not on file  . Minutes of Exercise per Session: Not on file  Stress:   . Feeling of Stress : Not on file  Social Connections:   . Frequency of Communication with Friends and Family: Not on file  . Frequency of Social Gatherings with Friends and Family: Not on file  . Attends Religious Services: Not on file  . Active Member of Clubs or Organizations: Not on file  . Attends Archivist Meetings: Not on file  . Marital Status: Not on file     Family History: The patient's family history is not on file.  ROS:   Please see the history of present illness.     All other systems reviewed and are  negative.  EKGs/Labs/Other Studies Reviewed:    The following studies were reviewed today: Coronary CTA Feb 2020  EKG:  EKG is ordered today.  The ekg ordered today demonstrates NSR-HR 80  Recent Labs: 12/17/2018: ALT 12; BUN 8; Creatinine, Ser 0.91; Hemoglobin 15.0; Platelets 285; Potassium 4.3; Sodium 139  Recent Lipid Panel    Component Value Date/Time   CHOL 204 (H) 09/08/2011 0420   TRIG 444 (H) 09/08/2011 0420   HDL 23 (L) 09/08/2011 0420   VLDL SEE COMMENT 09/08/2011 0420   LDLCALC SEE COMMENT 09/08/2011 0420    Physical Exam:    VS:  BP 128/82   Pulse 82   Temp (!) 97.2 F (36.2 C) (Temporal)   Ht 4\' 8"  (1.422 m)   Wt 148 lb (67.1 kg)   LMP 07/30/2017   SpO2 99%   BMI 33.18 kg/m     Wt Readings from Last 3 Encounters:  04/06/19 148 lb (67.1 kg)  03/31/19 151 lb (68.5 kg)  01/20/19 145 lb (65.8 kg)     GEN:  Overweight Caucasian female, well developed in no acute distress HEENT: Normal NECK: No JVD; No carotid bruits CARDIAC: RRR, no murmurs, rubs, gallops RESPIRATORY:  Clear to auscultation without rales, wheezing or rhonchi  ABDOMEN: Soft, non-tender, non-distended MUSCULOSKELETAL:  No edema; No deformity  SKIN: Warm and dry NEUROLOGIC:  Alert and oriented x 3 PSYCHIATRIC:  Normal affect   ASSESSMENT:    Pre-operative clearance Seen today for pre op cardiac clearance prior to hysterectomy  History of chest pain No CAD and Ca++ score 0 on CTA Feb 2020  Essential hypertension Controlled  Neuropathy LE neuropathy- followed by Dr Justice Britain  Migraine with aura and without status migrainosus, not intractable Dr Leta Baptist saw her in June 2020- Dr Melrose Nakayama at [facility] now follows her.    PLAN:    Pt is an acceptable risk from a cardiac standpoint for proposed procedure with out any further cardiac work.  We can see again on a PRN basis.    Medication Adjustments/Labs and Tests Ordered: Current medicines are reviewed at length with the  patient today.  Concerns regarding medicines are outlined above.  Orders Placed This Encounter  Procedures  . EKG 12-Lead   No orders of the defined types were placed in this encounter.   Patient Instructions  Medication Instructions:  Your physician recommends that you continue on your current medications as directed. Please refer to the Current Medication list given to you today. *If you need a refill on your cardiac medications before your next appointment, please call your pharmacy*  Lab  Work: None  If you have labs (blood work) drawn today and your tests are completely normal, you will receive your results only by: Marland Kitchen MyChart Message (if you have MyChart) OR . A paper copy in the mail If you have any lab test that is abnormal or we need to change your treatment, we will call you to review the results.  Testing/Procedures: None   Follow-Up: At [facility], you and your health needs are our priority.  As part of our continuing mission to provide you with exceptional heart care, we have created designated Provider Care Teams.  These Care Teams include your primary Cardiologist (physician) and Advanced Practice Providers (APPs -  Physician Assistants and Nurse Practitioners) who all work together to provide you with the care you need, when you need it.  Your next appointment:   FOLLOW UP ON A AS NEEDED BASIS  The format for your next appointment:   In Person  Provider:   Quay Burow, MD  Other Instructions Will request pre-op clearance from Dr Ilda Basset     Signed, Kerin Ransom, PA-C  04/06/2019 11:53 AM    [place]

## 2019-04-06 NOTE — Assessment & Plan Note (Signed)
No CAD and Ca++ score 0 on CTA Feb 2020

## 2019-04-06 NOTE — Assessment & Plan Note (Signed)
Dr Leta Baptist saw her in June 2020- Dr Melrose Nakayama at [facility] now follows her.

## 2019-04-06 NOTE — Assessment & Plan Note (Signed)
Controlled.  

## 2019-04-06 NOTE — Assessment & Plan Note (Signed)
Seen today for pre op cardiac clearance prior to hysterectomy

## 2019-04-06 NOTE — Assessment & Plan Note (Signed)
LE neuropathy- followed by Dr Justice Britain

## 2019-04-10 ENCOUNTER — Other Ambulatory Visit: Payer: Self-pay | Admitting: Obstetrics and Gynecology

## 2019-04-10 ENCOUNTER — Encounter (HOSPITAL_BASED_OUTPATIENT_CLINIC_OR_DEPARTMENT_OTHER): Payer: Self-pay | Admitting: Obstetrics and Gynecology

## 2019-04-10 ENCOUNTER — Other Ambulatory Visit: Payer: Self-pay

## 2019-04-10 NOTE — Progress Notes (Signed)
Dr Josephine Igo consulted and chart reviewed. Order received for preop BMET and CBC.

## 2019-04-12 ENCOUNTER — Other Ambulatory Visit: Payer: Self-pay

## 2019-04-12 ENCOUNTER — Encounter (HOSPITAL_BASED_OUTPATIENT_CLINIC_OR_DEPARTMENT_OTHER)
Admission: RE | Admit: 2019-04-12 | Discharge: 2019-04-12 | Disposition: A | Discharge status: Home / Self Care | Payer: Managed Care, Other (non HMO) | Source: Ambulatory Visit | Attending: Obstetrics and Gynecology | Admitting: Obstetrics and Gynecology

## 2019-04-12 DIAGNOSIS — Z01812 Encounter for preprocedural laboratory examination: Secondary | ICD-10-CM | POA: Diagnosis present

## 2019-04-12 DIAGNOSIS — N83202 Unspecified ovarian cyst, left side: Secondary | ICD-10-CM | POA: Insufficient documentation

## 2019-04-12 DIAGNOSIS — N950 Postmenopausal bleeding: Secondary | ICD-10-CM | POA: Diagnosis not present

## 2019-04-12 LAB — BASIC METABOLIC PANEL
Anion gap: 8 (ref 5–15)
BUN: 12 mg/dL (ref 6–20)
CO2: 23 mmol/L (ref 22–32)
Calcium: 8.9 mg/dL (ref 8.9–10.3)
Chloride: 105 mmol/L (ref 98–111)
Creatinine, Ser: 0.88 mg/dL (ref 0.44–1.00)
GFR calc Af Amer: >60 mL/min (ref >60)
GFR calc non Af Amer: >60 mL/min (ref >60)
Glucose, Bld: 89 mg/dL (ref 70–99)
Potassium: 4.6 mmol/L (ref 3.5–5.1)
Sodium: 136 mmol/L (ref 135–145)

## 2019-04-12 LAB — CBC
HCT: 48.5 % — ABNORMAL HIGH (ref 36.0–46.0)
Hemoglobin: 16.3 g/dL — ABNORMAL HIGH (ref 12.0–15.0)
MCH: 32.3 pg (ref 26.0–34.0)
MCHC: 33.6 g/dL (ref 30.0–36.0)
MCV: 96 fL (ref 80.0–100.0)
Platelets: 330 10*3/uL (ref 150–400)
RBC: 5.05 MIL/uL (ref 3.87–5.11)
RDW: 12.5 % (ref 11.5–15.5)
WBC: 11.3 10*3/uL — ABNORMAL HIGH (ref 4.0–10.5)
nRBC: 0 % (ref 0.0–0.2)

## 2019-04-12 LAB — POCT PREGNANCY, URINE: Preg Test, Ur: NEGATIVE

## 2019-04-12 NOTE — Progress Notes (Signed)

## 2019-04-15 ENCOUNTER — Other Ambulatory Visit (HOSPITAL_COMMUNITY)
Admission: RE | Admit: 2019-04-15 | Discharge: 2019-04-15 | Disposition: A | Discharge status: Home / Self Care | Payer: Managed Care, Other (non HMO) | Source: Ambulatory Visit | Attending: Obstetrics and Gynecology | Admitting: Obstetrics and Gynecology

## 2019-04-15 DIAGNOSIS — Z20828 Contact with and (suspected) exposure to other viral communicable diseases: Secondary | ICD-10-CM | POA: Insufficient documentation

## 2019-04-15 DIAGNOSIS — Z01812 Encounter for preprocedural laboratory examination: Secondary | ICD-10-CM | POA: Insufficient documentation

## 2019-04-15 LAB — SARS CORONAVIRUS 2 (TAT 6-24 HRS): SARS Coronavirus 2: NEGATIVE

## 2019-04-18 ENCOUNTER — Other Ambulatory Visit: Payer: Self-pay

## 2019-04-18 ENCOUNTER — Encounter (HOSPITAL_BASED_OUTPATIENT_CLINIC_OR_DEPARTMENT_OTHER)
Admission: RE | Disposition: A | Discharge status: Home / Self Care | Payer: Self-pay | Source: Home / Self Care | Attending: Obstetrics and Gynecology

## 2019-04-18 ENCOUNTER — Encounter (HOSPITAL_BASED_OUTPATIENT_CLINIC_OR_DEPARTMENT_OTHER): Payer: Self-pay | Admitting: Obstetrics and Gynecology

## 2019-04-18 ENCOUNTER — Ambulatory Visit (HOSPITAL_BASED_OUTPATIENT_CLINIC_OR_DEPARTMENT_OTHER)
Admission: RE | Admit: 2019-04-18 | Discharge: 2019-04-18 | Disposition: A | Discharge status: Home / Self Care | Payer: Managed Care, Other (non HMO) | Attending: Obstetrics and Gynecology | Admitting: Obstetrics and Gynecology

## 2019-04-18 ENCOUNTER — Ambulatory Visit (HOSPITAL_BASED_OUTPATIENT_CLINIC_OR_DEPARTMENT_OTHER): Payer: Managed Care, Other (non HMO) | Admitting: Certified Registered Nurse Anesthetist

## 2019-04-18 DIAGNOSIS — N83202 Unspecified ovarian cyst, left side: Secondary | ICD-10-CM

## 2019-04-18 DIAGNOSIS — N950 Postmenopausal bleeding: Secondary | ICD-10-CM | POA: Insufficient documentation

## 2019-04-18 DIAGNOSIS — D271 Benign neoplasm of left ovary: Secondary | ICD-10-CM | POA: Diagnosis not present

## 2019-04-18 DIAGNOSIS — M4802 Spinal stenosis, cervical region: Secondary | ICD-10-CM | POA: Diagnosis not present

## 2019-04-18 DIAGNOSIS — E669 Obesity, unspecified: Secondary | ICD-10-CM | POA: Diagnosis not present

## 2019-04-18 DIAGNOSIS — G43909 Migraine, unspecified, not intractable, without status migrainosus: Secondary | ICD-10-CM | POA: Diagnosis not present

## 2019-04-18 DIAGNOSIS — G629 Polyneuropathy, unspecified: Secondary | ICD-10-CM | POA: Insufficient documentation

## 2019-04-18 DIAGNOSIS — I2510 Atherosclerotic heart disease of native coronary artery without angina pectoris: Secondary | ICD-10-CM | POA: Diagnosis not present

## 2019-04-18 DIAGNOSIS — Z8249 Family history of ischemic heart disease and other diseases of the circulatory system: Secondary | ICD-10-CM | POA: Diagnosis not present

## 2019-04-18 DIAGNOSIS — N882 Stricture and stenosis of cervix uteri: Secondary | ICD-10-CM

## 2019-04-18 DIAGNOSIS — Z6833 Body mass index (BMI) 33.0-33.9, adult: Secondary | ICD-10-CM | POA: Insufficient documentation

## 2019-04-18 DIAGNOSIS — Z888 Allergy status to other drugs, medicaments and biological substances status: Secondary | ICD-10-CM | POA: Insufficient documentation

## 2019-04-18 DIAGNOSIS — N924 Excessive bleeding in the premenopausal period: Secondary | ICD-10-CM

## 2019-04-18 DIAGNOSIS — K219 Gastro-esophageal reflux disease without esophagitis: Secondary | ICD-10-CM | POA: Insufficient documentation

## 2019-04-18 DIAGNOSIS — N838 Other noninflammatory disorders of ovary, fallopian tube and broad ligament: Secondary | ICD-10-CM | POA: Diagnosis not present

## 2019-04-18 DIAGNOSIS — Z9889 Other specified postprocedural states: Secondary | ICD-10-CM

## 2019-04-18 DIAGNOSIS — Z79899 Other long term (current) drug therapy: Secondary | ICD-10-CM | POA: Insufficient documentation

## 2019-04-18 DIAGNOSIS — Z885 Allergy status to narcotic agent status: Secondary | ICD-10-CM | POA: Diagnosis not present

## 2019-04-18 DIAGNOSIS — F17210 Nicotine dependence, cigarettes, uncomplicated: Secondary | ICD-10-CM | POA: Insufficient documentation

## 2019-04-18 DIAGNOSIS — D270 Benign neoplasm of right ovary: Secondary | ICD-10-CM | POA: Insufficient documentation

## 2019-04-18 DIAGNOSIS — N83201 Unspecified ovarian cyst, right side: Secondary | ICD-10-CM

## 2019-04-18 DIAGNOSIS — D251 Intramural leiomyoma of uterus: Secondary | ICD-10-CM | POA: Insufficient documentation

## 2019-04-18 DIAGNOSIS — I10 Essential (primary) hypertension: Secondary | ICD-10-CM | POA: Diagnosis not present

## 2019-04-18 HISTORY — PX: HYSTEROSCOPY WITH D & C: SHX1775

## 2019-04-18 HISTORY — PX: LAPAROSCOPIC SALPINGO OOPHERECTOMY: SHX5927

## 2019-04-18 SURGERY — DILATATION AND CURETTAGE /HYSTEROSCOPY
Anesthesia: General | Site: Vagina

## 2019-04-18 MED ORDER — LIDOCAINE 2% (20 MG/ML) 5 ML SYRINGE
INTRAMUSCULAR | Status: AC
  Filled 2019-04-18: qty 5

## 2019-04-18 MED ORDER — PROPOFOL 10 MG/ML IV BOLUS
INTRAVENOUS | Status: AC
  Filled 2019-04-18: qty 20

## 2019-04-18 MED ORDER — FENTANYL CITRATE (PF) 100 MCG/2ML IJ SOLN
INTRAMUSCULAR | Status: AC
  Filled 2019-04-18: qty 2

## 2019-04-18 MED ORDER — PROMETHAZINE HCL 25 MG/ML IJ SOLN: 6.2500 mg | INTRAMUSCULAR | Status: DC | PRN

## 2019-04-18 MED ORDER — FENTANYL CITRATE (PF) 250 MCG/5ML IJ SOLN
INTRAMUSCULAR | Status: DC | PRN
  Administered 2019-04-18: 100 ug via INTRAVENOUS

## 2019-04-18 MED ORDER — ACETAMINOPHEN 500 MG PO TABS
1000.0000 mg | ORAL_TABLET | Freq: Once | ORAL | Status: AC
  Administered 2019-04-18: 11:00:00 1000 mg via ORAL

## 2019-04-18 MED ORDER — HYDROMORPHONE HCL 1 MG/ML IJ SOLN
INTRAMUSCULAR | Status: AC
  Filled 2019-04-18: qty 0.5

## 2019-04-18 MED ORDER — DEXAMETHASONE SODIUM PHOSPHATE 10 MG/ML IJ SOLN
INTRAMUSCULAR | Status: AC
  Filled 2019-04-18: qty 1

## 2019-04-18 MED ORDER — HYDROMORPHONE HCL 1 MG/ML IJ SOLN
0.2500 mg | INTRAMUSCULAR | Status: DC | PRN
  Administered 2019-04-18: 0.25 mg via INTRAVENOUS
  Administered 2019-04-18: 0.5 mg via INTRAVENOUS

## 2019-04-18 MED ORDER — CELECOXIB 100 MG PO CAPS
100.0000 mg | ORAL_CAPSULE | Freq: Once | ORAL | Status: AC
  Administered 2019-04-18: 100 mg via ORAL

## 2019-04-18 MED ORDER — PROPOFOL 10 MG/ML IV BOLUS
INTRAVENOUS | Status: DC | PRN
  Administered 2019-04-18: 140 mg via INTRAVENOUS

## 2019-04-18 MED ORDER — OXYCODONE HCL 5 MG PO TABS: 5.0000 mg | ORAL_TABLET | Freq: Four times a day (QID) | ORAL | 0 refills | Status: DC | PRN

## 2019-04-18 MED ORDER — ROCURONIUM BROMIDE 10 MG/ML (PF) SYRINGE
PREFILLED_SYRINGE | INTRAVENOUS | Status: DC | PRN
  Administered 2019-04-18: 70 mg via INTRAVENOUS

## 2019-04-18 MED ORDER — ONDANSETRON HCL 4 MG/2ML IJ SOLN
INTRAMUSCULAR | Status: AC
  Filled 2019-04-18: qty 2

## 2019-04-18 MED ORDER — CELECOXIB 100 MG PO CAPS
ORAL_CAPSULE | ORAL | Status: AC
  Filled 2019-04-18: qty 1

## 2019-04-18 MED ORDER — SODIUM CHLORIDE 0.9 % IR SOLN
Status: DC | PRN
  Administered 2019-04-18: 1

## 2019-04-18 MED ORDER — LACTATED RINGERS IV SOLN: INTRAVENOUS | Status: DC

## 2019-04-18 MED ORDER — LIDOCAINE 2% (20 MG/ML) 5 ML SYRINGE
INTRAMUSCULAR | Status: DC | PRN
  Administered 2019-04-18: 60 mg via INTRAVENOUS

## 2019-04-18 MED ORDER — OXYCODONE HCL 5 MG PO TABS
5.0000 mg | ORAL_TABLET | Freq: Once | ORAL | Status: AC | PRN
  Administered 2019-04-18: 15:00:00 5 mg via ORAL

## 2019-04-18 MED ORDER — BUPIVACAINE HCL (PF) 0.5 % IJ SOLN
INTRAMUSCULAR | Status: AC
  Filled 2019-04-18: qty 30

## 2019-04-18 MED ORDER — GABAPENTIN 100 MG PO CAPS
100.0000 mg | ORAL_CAPSULE | Freq: Once | ORAL | Status: AC
  Administered 2019-04-18: 11:00:00 100 mg via ORAL

## 2019-04-18 MED ORDER — MIDAZOLAM HCL 2 MG/2ML IJ SOLN
1.0000 mg | INTRAMUSCULAR | Status: DC | PRN
  Administered 2019-04-18: 2 mg via INTRAVENOUS

## 2019-04-18 MED ORDER — DOCUSATE SODIUM 100 MG PO CAPS: 100.0000 mg | ORAL_CAPSULE | Freq: Two times a day (BID) | ORAL | 0 refills | Status: AC

## 2019-04-18 MED ORDER — OXYCODONE HCL 5 MG/5ML PO SOLN: 5.0000 mg | Freq: Once | ORAL | Status: AC | PRN

## 2019-04-18 MED ORDER — GABAPENTIN 100 MG PO CAPS
ORAL_CAPSULE | ORAL | Status: AC
  Filled 2019-04-18: qty 1

## 2019-04-18 MED ORDER — MIDAZOLAM HCL 2 MG/2ML IJ SOLN
INTRAMUSCULAR | Status: AC
  Filled 2019-04-18: qty 2

## 2019-04-18 MED ORDER — FENTANYL CITRATE (PF) 100 MCG/2ML IJ SOLN: 50.0000 ug | INTRAMUSCULAR | Status: DC | PRN

## 2019-04-18 MED ORDER — SILVER NITRATE-POT NITRATE 75-25 % EX MISC
CUTANEOUS | Status: AC
  Filled 2019-04-18: qty 10

## 2019-04-18 MED ORDER — SUGAMMADEX SODIUM 200 MG/2ML IV SOLN
INTRAVENOUS | Status: DC | PRN
  Administered 2019-04-18: 200 mg via INTRAVENOUS

## 2019-04-18 MED ORDER — BUPIVACAINE HCL (PF) 0.5 % IJ SOLN
INTRAMUSCULAR | Status: DC | PRN
  Administered 2019-04-18: 14 mL

## 2019-04-18 MED ORDER — ACETAMINOPHEN 500 MG PO TABS
ORAL_TABLET | ORAL | Status: AC
  Filled 2019-04-18: qty 2

## 2019-04-18 MED ORDER — OXYCODONE HCL 5 MG PO TABS
ORAL_TABLET | ORAL | Status: AC
  Filled 2019-04-18: qty 1

## 2019-04-18 MED ORDER — MEPERIDINE HCL 25 MG/ML IJ SOLN: 6.2500 mg | INTRAMUSCULAR | Status: DC | PRN

## 2019-04-18 MED ORDER — ROCURONIUM BROMIDE 10 MG/ML (PF) SYRINGE
PREFILLED_SYRINGE | INTRAVENOUS | Status: AC
  Filled 2019-04-18: qty 10

## 2019-04-18 MED ORDER — ONDANSETRON HCL 4 MG/2ML IJ SOLN
INTRAMUSCULAR | Status: DC | PRN
  Administered 2019-04-18: 4 mg via INTRAVENOUS

## 2019-04-18 MED ORDER — DEXAMETHASONE SODIUM PHOSPHATE 10 MG/ML IJ SOLN
INTRAMUSCULAR | Status: DC | PRN
  Administered 2019-04-18: 4 mg via INTRAVENOUS

## 2019-04-18 SURGICAL SUPPLY — 47 items
APPLICATOR COTTON TIP 6 STRL (MISCELLANEOUS) IMPLANT
APPLICATOR COTTON TIP 6IN STRL (MISCELLANEOUS)
BLADE SURG 15 STRL LF DISP TIS (BLADE) ×2 IMPLANT
BLADE SURG 15 STRL SS (BLADE) ×2
CABLE HIGH FREQUENCY MONO STRZ (ELECTRODE) IMPLANT
CANISTER SUCT 3000ML PPV (MISCELLANEOUS) IMPLANT
CATH ROBINSON RED A/P 16FR (CATHETERS) IMPLANT
DEFOGGER SCOPE WARMER CLEARIFY (MISCELLANEOUS) IMPLANT
DERMABOND ADVANCED (GAUZE/BANDAGES/DRESSINGS) ×2
DERMABOND ADVANCED .7 DNX12 (GAUZE/BANDAGES/DRESSINGS) ×2 IMPLANT
DRSG OPSITE POSTOP 3X4 (GAUZE/BANDAGES/DRESSINGS) ×4 IMPLANT
DURAPREP 26ML APPLICATOR (WOUND CARE) ×4 IMPLANT
ELECT REM PT RETURN 9FT ADLT (ELECTROSURGICAL) ×4
ELECTRODE REM PT RTRN 9FT ADLT (ELECTROSURGICAL) ×2 IMPLANT
GAUZE 4X4 16PLY RFD (DISPOSABLE) ×4 IMPLANT
GLOVE BIOGEL PI IND STRL 7.0 (GLOVE) ×4 IMPLANT
GLOVE BIOGEL PI IND STRL 7.5 (GLOVE) ×4 IMPLANT
GLOVE BIOGEL PI INDICATOR 7.0 (GLOVE) ×4
GLOVE BIOGEL PI INDICATOR 7.5 (GLOVE) ×4
GLOVE SURG SS PI 7.0 STRL IVOR (GLOVE) ×4 IMPLANT
GOWN STRL REUS W/ TWL LRG LVL3 (GOWN DISPOSABLE) ×6 IMPLANT
GOWN STRL REUS W/TWL LRG LVL3 (GOWN DISPOSABLE) ×14 IMPLANT
GOWN STRL REUS W/TWL XL LVL3 (GOWN DISPOSABLE) ×4 IMPLANT
KIT PROCEDURE FLUENT (KITS) ×4 IMPLANT
LIGASURE VESSEL 5MM BLUNT TIP (ELECTROSURGICAL) ×4 IMPLANT
NS IRRIG 1000ML POUR BTL (IV SOLUTION) ×4 IMPLANT
PACK LAPAROSCOPY BASIN (CUSTOM PROCEDURE TRAY) ×4 IMPLANT
PACK TRENDGUARD 450 HYBRID PRO (MISCELLANEOUS) ×2 IMPLANT
PACK VAGINAL MINOR WOMEN LF (CUSTOM PROCEDURE TRAY) ×4 IMPLANT
PAD OB MATERNITY 4.3X12.25 (PERSONAL CARE ITEMS) ×4 IMPLANT
PAD PREP 24X48 CUFFED NSTRL (MISCELLANEOUS) ×4 IMPLANT
POUCH LAPAROSCOPIC INSTRUMENT (MISCELLANEOUS) IMPLANT
POUCH SPECIMEN RETRIEVAL 10MM (ENDOMECHANICALS) ×4 IMPLANT
SCISSORS LAP 5X35 DISP (ENDOMECHANICALS) IMPLANT
SET IRRIG TUBING LAPAROSCOPIC (IRRIGATION / IRRIGATOR) ×4 IMPLANT
SET TUBE SMOKE EVAC HIGH FLOW (TUBING) ×4 IMPLANT
SLEEVE ADV FIXATION 5X100MM (TROCAR) ×4 IMPLANT
SLEEVE SCD COMPRESS KNEE MED (MISCELLANEOUS) ×4 IMPLANT
SUT MON AB 4-0 PS1 27 (SUTURE) IMPLANT
SUT VICRYL 0 UR6 27IN ABS (SUTURE) ×4 IMPLANT
SYR 10ML LL (SYRINGE) ×4 IMPLANT
TOWEL GREEN STERILE FF (TOWEL DISPOSABLE) ×8 IMPLANT
TRAY FOLEY W/BAG SLVR 14FR LF (SET/KITS/TRAYS/PACK) ×4 IMPLANT
TRENDGUARD 450 HYBRID PRO PACK (MISCELLANEOUS) ×4
TROCAR ADV FIXATION 5X100MM (TROCAR) ×4 IMPLANT
TROCAR BALLN 12MMX100 BLUNT (TROCAR) ×4 IMPLANT
TROCAR XCEL NON-BLD 5MMX100MML (ENDOMECHANICALS) IMPLANT

## 2019-04-18 NOTE — Op Note (Signed)
Operative Note   04/18/2019  PRE-OP DIAGNOSIS *Post menopausal bleeding *Cervical stenosis *Left ovarian cyst   POST-OP DIAGNOSIS *Same *Bilateral ovarian cysts (benign appearing)  SURGEON: Surgeon(s) and Role:    * Aletha Halim, MD - Primary  ASSISTANT:    Lavonia Drafts, MD - Assisting  PROCEDURE: Hysteroscopy, Dilation and Curettage, Laparoscopic bilateral salpingoophorectomy  ANESTHESIA: General and local  ESTIMATED BLOOD LOSS: 67mL  DRAINS: indwelling foley per anesthesia note   TOTAL IV FLUIDS: per anesthesia note  VTE PROPHYLAXIS: SCDs to the bilateral lower extremities  FLUID DEFICIT: 263mL   ANTIBIOTICS: not indicated  SPECIMENS: endometrial curettings, bilateral tubes and ovaries  DISPOSITION: PACU - hemodynamically stable.  CONDITION: stable  COMPLICATIONS: None  FINDINGS: normal EGBUS, cervix with stenosis, uterus sounded to 8cm. Normal endocervical canal.  The uterus appeared atrophic and grossly normal.   No intraabdominal adhesions were noted, normal liver edge, stomach edge, appendix, uterus. Normal appearing fallopian tubes with surgically absent mid points. Left ovarian cyst (5-6cm, benign appearing and benign appearing fluid), right ovarian cyst (3-4cm, benign appearing and benign appearing fluid).  Normal anterior and posterior cul de sac, para ovarian fossae.   DESCRIPTION OF PROCEDURE: After informed consent was obtained, the patient was taken to the operating room where anesthesia was obtained without difficulty. The patient was positioned in the dorsal lithotomy position in [place] and her arms were carefully tucked at her sides and the usual precautions were taken.  She was prepped and draped in normal sterile fashion.  Time-out was performed and a Foley catheter was placed into the bladder.  Cervix grasped with tenaculum and dilated to 21 french pratt dilator. Hysteroscope introduced with above noted findings. Curettage  done and hysteroscope used again to examine and no evidence of perforation noted.  A Hulka uterine manipulator was then placed.  Gown and gloves were then changed, and after injection of local anesthesia, the open technique was used to place an infraumbilical [id number] baloon trocar under direct visualization. The laparoscope was introduced and CO2 gas was infused for pneumoperitoneum to a pressure of 15 mm Hg and the area below inspected for injury.  The patient was placed in Trendelenburg and the bowel was displaced up into the upper abdomen, and the right and left lateral 5-mm ports were placed under direct visualization of the laparoscope, after injection of local anesthesia.  The bilateral ureters were identified from the level of the pelvic brim down. The right IP ligament was identified and serially cauterized and transected and continued through the mesosalpingx and specimen transected at the cornua and placed in the posterior cul de sac. Next the same was done on the right side. An endocatch bag was introduced and the specimens removed via the bag.    The lateral trocars were removed under visualization.   Before the umbilical trocar was removed the CO2 gas was released.  The fascia there was closed with 0 vicryl suture in a figure of eight fashion.   The skin incision at the umbilicus was closed with a subcuticular stitch of 4-0 monocryl.  The remaining skin incisions were closed with Dermabond glue.  The patient tolerated the procedure well.  Sponge, lap and needle counts were correct x2.  The patient was taken to recovery room in excellent condition.  Durene Romans MD Attending Center for [employer] farm manager)

## 2019-04-18 NOTE — Transfer of Care (Signed)
Immediate Anesthesia Transfer of Care Note  Patient: Robyn Anderson  Procedure(s) Performed: DILATATION AND CURETTAGE /HYSTEROSCOPY (N/A Vagina ) LAPAROSCOPIC SALPINGO OOPHORECTOMY (Bilateral Abdomen)  Patient Location: PACU  Anesthesia Type:General  Level of Consciousness: drowsy  Airway & Oxygen Therapy: Patient Spontanous Breathing  Post-op Assessment: Report given to RN and Post -op Vital signs reviewed and stable  Post vital signs: Reviewed and stable  Last Vitals:  Vitals Value Taken Time  BP 113/61 04/18/19 1251  Temp    Pulse 82 04/18/19 1254  Resp 21 04/18/19 1254  SpO2 95 % 04/18/19 1254  Vitals shown include unvalidated device data.  Last Pain:  Vitals:   04/18/19 0956  TempSrc: Oral  PainSc: 5          Complications: No apparent anesthesia complications

## 2019-04-18 NOTE — Anesthesia Procedure Notes (Signed)
Procedure Name: Intubation Performed by: Milford Cage, CRNA Pre-anesthesia Checklist: Patient identified, Emergency Drugs available, Suction available and Patient being monitored Patient Re-evaluated:Patient Re-evaluated prior to induction Oxygen Delivery Method: Circle System Utilized Preoxygenation: Pre-oxygenation with 100% oxygen Induction Type: IV induction Ventilation: Mask ventilation without difficulty Laryngoscope Size: Miller and 2 Grade View: Grade II Tube type: Oral Tube size: 6.5 mm Number of attempts: 1 Airway Equipment and Method: Stylet and Oral airway Placement Confirmation: ETT inserted through vocal cords under direct vision,  positive ETCO2 and breath sounds checked- equal and bilateral Secured at: 20 cm Tube secured with: Tape Dental Injury: Teeth and Oropharynx as per pre-operative assessment

## 2019-04-18 NOTE — Discharge Instructions (Addendum)
Laparoscopic Surgery Discharge Instructions  You have just undergone a laparoscopic surgery.  The following list should answer your most common questions.  Although we will discuss your surgery and post-operative instructions with you prior to your discharge, this list will serve as a reminder if you fail to recall the details of what we discussed.  We will discuss your surgery once again in detail at your post-op visit in two to four weeks. If you haven't already done so, please call to make your appointment as soon as possible.  How you will feel: Although you have just undergone a major surgery, your recovery will be significantly shorter since the surgery was performed through much smaller incisions than the traditional approach.  You should feel slightly better each day.  If you suddenly feel much worse than the prior day, please call the clinic.  It's important during the early part of your recovery that you maintain some activity.  Walking is encouraged.  You will quicken your recovery by continued activity.  Incision:  Your incisions will be closed with dissolvable stitches or surgical adhesive (glue).  There may be Band-aids and/or Steri-strips covering your incisions.  If there is no drainage from the incisions you may remove the Band-aids in one to two days.  You may notice some minor bruising at the incision sites.  This is common and will resolve within several days.  Please inform us if the redness at the edges of your incision appears to be spreading.  If the skin around your incision becomes warm to the touch, or if you notice a pus-like drainage, please call the office.   Stairs/Driving/Activities: You may climb stairs if necessary.  If you've had general anesthesia, do not drive a car the rest of the day today.  You may begin light housework when you feel up to it, but avoid heavy lifting (more than 15-20lbs) or pushing until cleared for these activities by your physician.  Hygiene:   Do not soak your incisions.  Showers are acceptable but you may not take a bath or swim in a pool.  Cleanse your incisions daily with soap and water.  Medications:  Please resume taking any medications that you were taking prior to the surgery.  If we have prescribed any new medications for you, please take them as directed.  Constipation:  It is fairly common to experience some difficulty in moving your bowels following major surgery.  Being active will help to reduce this likelihood. A diet rich in fiber and plenty of liquids is desirable.  If you do become constipated, a mild laxative such as Miralax, Milk of Magnesia, or Metamucil, or a stool softener such as Colace, is recommended.  General Instructions: If you develop a fever of 100.5 degrees or higher, please call the office number(s) below for physician on call.   Post Anesthesia Home Care Instructions  Activity: Get plenty of rest for the remainder of the day. A responsible individual must stay with you for 24 hours following the procedure.  For the next 24 hours, DO NOT: -Drive a car -Paediatric nurse -Drink alcoholic beverages -Take any medication unless instructed by your physician -Make any legal decisions or sign important papers.  Meals: Start with liquid foods such as gelatin or soup. Progress to regular foods as tolerated. Avoid greasy, spicy, heavy foods. If nausea and/or vomiting occur, drink only clear liquids until the nausea and/or vomiting subsides. Call your physician if vomiting continues.  Special Instructions/Symptoms: Your throat may feel  dry or sore from the anesthesia or the breathing tube placed in your throat during surgery. If this causes discomfort, gargle with warm salt water. The discomfort should disappear within 24 hours.  If you had a scopolamine patch placed behind your ear for the management of post- operative nausea and/or vomiting:  1. The medication in the patch is effective for 72 hours, after  which it should be removed.  Wrap patch in a tissue and discard in the trash. Wash hands thoroughly with soap and water. 2. You may remove the patch earlier than 72 hours if you experience unpleasant side effects which may include dry mouth, dizziness or visual disturbances. 3. Avoid touching the patch. Wash your hands with soap and water after contact with the patch.   Post Anesthesia Home Care Instructions  Activity: Get plenty of rest for the remainder of the day. A responsible individual must stay with you for 24 hours following the procedure.  For the next 24 hours, DO NOT: -Drive a car -Paediatric nurse -Drink alcoholic beverages -Take any medication unless instructed by your physician -Make any legal decisions or sign important papers.  Meals: Start with liquid foods such as gelatin or soup. Progress to regular foods as tolerated. Avoid greasy, spicy, heavy foods. If nausea and/or vomiting occur, drink only clear liquids until the nausea and/or vomiting subsides. Call your physician if vomiting continues.  Special Instructions/Symptoms: Your throat may feel dry or sore from the anesthesia or the breathing tube placed in your throat during surgery. If this causes discomfort, gargle with warm salt water. The discomfort should disappear within 24 hours.  If you had a scopolamine patch placed behind your ear for the management of post- operative nausea and/or vomiting:  1. The medication in the patch is effective for 72 hours, after which it should be removed.  Wrap patch in a tissue and discard in the trash. Wash hands thoroughly with soap and water. 2. You may remove the patch earlier than 72 hours if you experience unpleasant side effects which may include dry mouth, dizziness or visual disturbances. 3. Avoid touching the patch. Wash your hands with soap and water after contact with the patch.    No tylenol until after 5pm today.  No ibuprofen until after 8pm tonight.

## 2019-04-18 NOTE — H&P (Signed)
Obstetrics & Gynecology Surgical H&P   Date of Surgery: 04/18/2019   Primary OBGYN: [facility] Primary Care Provider: Danae Anderson  Reason for Admission: scheduled surgery for ovarian cysts and postmenopausal bleeding, pain  History of Present Illness: Robyn Anderson is a 53 y.o. G2P2 (Patient's last menstrual period was 07/30/2017.), with the above CC. PMHx is significant for CAD, c/s x 2, BTL, HTN  Patient seen by me for PMB and unable to get into cervix due to cervical stenosis. Work up u/s also showed ovarian cyst. D/w pt and recommend hysteroscopy, d&c and pt would like both ovaries and tubes removed.   ROS: A 12-point review of systems was performed and negative, except as stated in the above HPI.  OBGYN History: As per HPI. OB History  Gravida Para Term Preterm AB Living  2 2       2   SAB TAB Ectopic Multiple Live Births          2    # Outcome Date GA Lbr Len/2nd Weight Sex Delivery Anes PTL Lv  2 Para      CS-LTranv     1 Para      CS-LTranv        Past Medical History: Past Medical History:  Diagnosis Date  . Barrett esophagus   . Coronary artery disease   . GERD (gastroesophageal reflux disease)   . Headache    migraines 1-2/mo  . Hypertension   . Obesity (BMI 30-39.9)     Past Surgical History: Past Surgical History:  Procedure Laterality Date  . ATHERECTOMY Right    renal  . CARPAL TUNNEL RELEASE Left   . CARPAL TUNNEL RELEASE Right 09/27/2015   Procedure: OPEN CARPAL TUNNEL RELEASE RIGHT HAND;  Surgeon: Robyn Kail, MD;  Location: [place];  Service: Orthopedics;  Laterality: Right;  . CESAREAN SECTION     x2  . COLONOSCOPY    . ESOPHAGOGASTRODUODENOSCOPY    . TUBAL LIGATION      Family History:  Family History  Problem Relation Age of Onset  . Cancer Mother   . Hypertension Mother   . Cancer Father   . Hypertension Father   . Hypertension Sister   . Hypertension Brother   . Diabetes Paternal Grandmother    . Stroke Maternal Grandmother   . Epilepsy Son     Social History:  Social History   Socioeconomic History  . Marital status: Single    Spouse name: Not on file  . Number of children: 2  . Years of education: Not on file  . Highest education level: Not on file  Occupational History    Comment: shift work  Tobacco Use  . Smoking status: Current Every Day Smoker    Packs/day: 0.50    Years: 30.00    Pack years: 15.00    Types: Cigarettes  . Smokeless tobacco: Never Used  Substance and Sexual Activity  . Alcohol use: No  . Drug use: No  . Sexual activity: Yes    Birth control/protection: Surgical  Other Topics Concern  . Not on file  Social History Narrative   Lives with boyfriend   Social Determinants of Health   Financial Resource Strain:   . Difficulty of Paying Living Expenses: Not on file  Food Insecurity:   . Worried About Charity fundraiser in the Last Year: Not on file  . Ran Out of Food in the Last Year: Not on file  Transportation Needs:   .  Lack of Transportation (Medical): Not on file  . Lack of Transportation (Non-Medical): Not on file  Physical Activity:   . Days of Exercise per Week: Not on file  . Minutes of Exercise per Session: Not on file  Stress:   . Feeling of Stress : Not on file  Social Connections:   . Frequency of Communication with Friends and Family: Not on file  . Frequency of Social Gatherings with Friends and Family: Not on file  . Attends Religious Services: Not on file  . Active Member of Clubs or Organizations: Not on file  . Attends Archivist Meetings: Not on file  . Marital Status: Not on file  Intimate Partner Violence:   . Fear of Current or Ex-Partner: Not on file  . Emotionally Abused: Not on file  . Physically Abused: Not on file  . Sexually Abused: Not on file    Allergy: Allergies  Allergen Reactions  . Atorvastatin Other (See Comments)    Muscle pain (generalized)  . Topamax [Topiramate]      tremors  . Vicodin [Hydrocodone-Acetaminophen] Nausea And Vomiting    Current Outpatient Medications: Medications Prior to Admission  Medication Sig Dispense Refill Last Dose  . lisinopril (ZESTRIL) 30 MG tablet TAKE 1 TABLET BY MOUTH EVERY DAY 90 tablet 1 04/17/2019 at 0800  . nortriptyline (PAMELOR) 10 MG capsule Take 10 mg by mouth at bedtime. 1 capsule by mouth nightly for one week then increase to 2 capsules nightly   04/17/2019 at Unknown time  . omeprazole (PRILOSEC) 40 MG capsule Take 40 mg by mouth daily.   04/17/2019 at Unknown time  . pregabalin (LYRICA) 50 MG capsule Take 50 mg by mouth 3 (three) times daily.   04/17/2019 at Unknown time  . SUMAtriptan (IMITREX) 25 MG tablet Take 25 mg by mouth every 2 (two) hours as needed for migraine. May repeat in 2 hours if headache persists or recurs.   More than a month at Unknown time     Hospital Medications: Current Facility-Administered Medications  Medication Dose Route Frequency Provider Last Rate Last Admin  . fentaNYL (SUBLIMAZE) injection 50 mcg  50 mcg Intravenous Q5 min PRN Robyn Igo, MD      . lactated ringers infusion   Intravenous Continuous Robyn Igo, MD      . lactated ringers infusion   Intravenous Continuous Robyn Halim, MD 125 mL/hr at 04/18/19 1008 New Bag at 04/18/19 1008  . midazolam (VERSED) injection 1-2 mg  1-2 mg Intravenous Q5 min PRN Robyn Igo, MD         Physical Exam:   Current Vital Signs 24h Vital Sign Ranges  T 97.8 F (36.6 C) Temp  Avg: 97.8 F (36.6 C)  Min: 97.8 F (36.6 C)  Max: 97.8 F (36.6 C)  BP (!) 141/74 BP  Min: 141/74  Max: 141/74  HR 80 Pulse  Avg: 80  Min: 80  Max: 80  RR 16 Resp  Avg: 16  Min: 16  Max: 16  SaO2 100 % Room Air SpO2  Avg: 100 %  Min: 100 %  Max: 100 %       24 Hour I/O Current Shift I/O  Time Ins Outs No intake/output data recorded. No intake/output data recorded.  General appearance: Well nourished, well developed female in no acute  distress.  Cardiovascular: S1, S2 normal, no murmur, rub or gallop, regular rate and rhythm Respiratory:  Clear to auscultation bilateral. Normal respiratory effort Abdomen: positive bowel sounds  and no masses, hernias; diffusely non tender to palpation, non distended Neuro/Psych:  Normal mood and affect.  Skin:  Warm and dry.  Extremities: no clubbing, cyanosis, or edema.    Laboratory: Negative: covid, pregnancy Recent Labs  Lab 04/12/19 1518  WBC 11.3*  HGB 16.3*  HCT 48.5*  PLT 330   Recent Labs  Lab 04/12/19 1518  NA 136  K 4.6  CL 105  CO2 23  BUN 12  CREATININE 0.88  CALCIUM 8.9  GLUCOSE 89   Imaging:  No new imaging   CLINICAL DATA:  Vaginal bleeding, history ovarian cyst, follow-up ovarian cysts  EXAM: ULTRASOUND PELVIS TRANSVAGINAL  TECHNIQUE: Transvaginal ultrasound examination of the pelvis was performed including evaluation of the uterus, ovaries, adnexal regions, and pelvic cul-de-sac.  COMPARISON:  12/17/2018  FINDINGS: Uterus  Measurements: 8.5 x 4.5 x 6.1 cm = volume: 122 mL. Anteverted. Anterior wall Caesarean section scar. Prominent nabothian cyst at cervix. Heterogeneous myometrium with a single small anterior LEFT lateral intramural leiomyoma 1.3 x 1.3 x 1.3 cm.  Endometrium  Thickness: 4 mm. Small amount of nonspecific endometrial fluid at mid uterus, question related to prior Caesarean section. Otherwise unremarkable.  Right ovary  Measurements: 3.9 x 2.9 x 3.3 cm = volume: 19.4 mL. Dominant follicle without mass  Left ovary  Measurements: 5.2 x 3.2 x 2.4 cm = volume: 20.8 mL. Small cyst 3.8 x 3.6 x 4.0 cm, simple features. No additional masses.  Other findings:  Trace free pelvic fluid.  No other adnexal masses.  IMPRESSION: Small simple appearing LEFT ovarian cyst 4.0 cm greatest size.  Small amount of nonspecific endometrial fluid at mid uterus, potentially related to Caesarean section  scar.  Small intramural leiomyoma anterior LEFT uterus 1.3 cm diameter.   Electronically Signed   By: Robyn Anderson M.D.   On: 02/06/2019 08:52   Assessment: Ms. Wilmott is a 53 y.o. G2P2 (Patient's last menstrual period was 07/30/2017.) here for scheduled surgery. Pt doing well  Plan: Pt consented for hysteroscopy, d&c, laparoscopic BSO. Plan for discharge to home from PACU post op  Can proceed when OR is ready  Durene Romans MD Attending [phone] [facility] [place] [facility])

## 2019-04-18 NOTE — Anesthesia Preprocedure Evaluation (Signed)
Anesthesia Evaluation  Patient identified by MRN, date of birth, ID band Patient awake    Reviewed: Allergy & Precautions, H&P , NPO status , Patient's Chart, lab work & pertinent test results  Airway Mallampati: II   Neck ROM: full  Mouth opening: Limited Mouth Opening  Dental no notable dental hx.    Pulmonary Current Smoker and Patient abstained from smoking.,    Pulmonary exam normal        Cardiovascular hypertension, On Medications Normal cardiovascular exam     Neuro/Psych  Headaches,    GI/Hepatic Neg liver ROS, GERD  ,  Endo/Other  negative endocrine ROS  Renal/GU negative Renal ROS     Musculoskeletal   Abdominal (+) + obese,   Peds  Hematology negative hematology ROS (+)   Anesthesia Other Findings   Reproductive/Obstetrics                             Anesthesia Physical  Anesthesia Plan  ASA: II  Anesthesia Plan: General   Post-op Pain Management:    Induction: Intravenous  PONV Risk Score and Plan: 2 and Ondansetron, Midazolam and Treatment may vary due to age or medical condition  Airway Management Planned: Oral ETT  Additional Equipment:   Intra-op Plan:   Post-operative Plan: Extubation in OR  Informed Consent: I have reviewed the patients History and Physical, chart, labs and discussed the procedure including the risks, benefits and alternatives for the proposed anesthesia with the patient or authorized representative who has indicated his/her understanding and acceptance.     Dental advisory given  Plan Discussed with: CRNA  Anesthesia Plan Comments:         Anesthesia Quick Evaluation

## 2019-04-18 NOTE — Anesthesia Postprocedure Evaluation (Signed)
Anesthesia Post Note  Patient: Robyn Anderson  Procedure(s) Performed: DILATATION AND CURETTAGE /HYSTEROSCOPY (N/A Vagina ) LAPAROSCOPIC SALPINGO OOPHORECTOMY (Bilateral Abdomen)     Patient location during evaluation: PACU Anesthesia Type: General Level of consciousness: awake and alert Pain management: pain level controlled Vital Signs Assessment: post-procedure vital signs reviewed and stable Respiratory status: spontaneous breathing, nonlabored ventilation and respiratory function stable Cardiovascular status: blood pressure returned to baseline and stable Postop Assessment: no apparent nausea or vomiting Anesthetic complications: no    Last Vitals:  Vitals:   04/18/19 1345 04/18/19 1415  BP: (!) 107/53 114/68  Pulse: 79 90  Resp: 13 16  Temp:  36.5 C  SpO2: 97% 100%    Last Pain:  Vitals:   04/18/19 1415  TempSrc:   PainSc: [place]

## 2019-04-18 NOTE — Progress Notes (Signed)
Called Dr. Ilda Basset d/t no orders for overnight stay. Dr. Ilda Basset stated that patient is to be discharged home today and not go to an overnight room.

## 2019-04-18 NOTE — Brief Op Note (Signed)
04/18/2019  12:44 PM  PATIENT:  Robyn Anderson  53 y.o. female  PRE-OPERATIVE DIAGNOSIS:  PMB Cervical Stenosis Lft Ovarian Cyst  POST-OPERATIVE DIAGNOSIS:  PMB Cervical Stenosis Lft Ovarian Cyst  PROCEDURE:  Procedure(s): DILATATION AND CURETTAGE /HYSTEROSCOPY (N/A) LAPAROSCOPIC SALPINGO OOPHORECTOMY (Bilateral)  SURGEON:  Surgeon(s) and Role:    * Aletha Halim, MD - Primary    * Lavonia Drafts, MD - Assisting  ANESTHESIA:   general and local  EBL: 74mL  BLOOD ADMINISTERED:none  DRAINS: indwelling foley   LOCAL MEDICATIONS USED:  MARCAINE     SPECIMEN:  Endometrial curettings, bilateral tubes and ovaries  DISPOSITION OF SPECIMEN:  PATHOLOGY  COUNTS:  YES  TOURNIQUET:  * No tourniquets in log *  DICTATION: .Note written in EPIC  PLAN OF CARE: Discharge to home after PACU  PATIENT DISPOSITION:  PACU - hemodynamically stable.   Delay start of Pharmacological VTE agent (>24hrs) due to surgical blood loss or risk of bleeding: not applicable  Durene Romans MD [phone] Attending [facility] [place] (Faculty Practice) 04/18/2019 Time: [phone]

## 2019-04-19 LAB — SURGICAL PATHOLOGY

## 2019-04-24 NOTE — Discharge Summary (Signed)
Gynecology Discharge Summary Date of Admission: 04/18/2019 Date of Discharge: 04/18/2019  The patient was admitted, as scheduled, and underwent a hysteroscopy, d&c, laparoscopic BSO; please refer to operative note for full details.  She was meeting all post op goals and discharged to home on POD#0  Allergies as of 04/18/2019      Reactions   Atorvastatin Other (See Comments)   Muscle pain (generalized)   Topamax [topiramate]    tremors   Vicodin [hydrocodone-acetaminophen] Nausea And Vomiting      Medication List    TAKE these medications   docusate sodium 100 MG capsule Commonly known as: COLACE Take 1 capsule (100 mg total) by mouth 2 (two) times daily for 7 days.   lisinopril 30 MG tablet Commonly known as: ZESTRIL TAKE 1 TABLET BY MOUTH EVERY DAY   nortriptyline 10 MG capsule Commonly known as: PAMELOR Take 10 mg by mouth at bedtime. 1 capsule by mouth nightly for one week then increase to 2 capsules nightly   omeprazole 40 MG capsule Commonly known as: PRILOSEC Take 40 mg by mouth daily.   oxyCODONE 5 MG immediate release tablet Commonly known as: Oxy IR/ROXICODONE Take 1 tablet (5 mg total) by mouth every 6 (six) hours as needed for severe pain.   pregabalin 50 MG capsule Commonly known as: LYRICA Take 50 mg by mouth 3 (three) times daily.   SUMAtriptan 25 MG tablet Commonly known as: IMITREX Take 25 mg by mouth every 2 (two) hours as needed for migraine. May repeat in 2 hours if headache persists or recurs.       No future appointments.  Request sent for 2-4 week post op visit  Robyn Anderson. MD Attending [facility] for [facility])

## 2019-05-12 ENCOUNTER — Emergency Department: Payer: Managed Care, Other (non HMO)

## 2019-05-12 ENCOUNTER — Other Ambulatory Visit: Payer: Self-pay

## 2019-05-12 ENCOUNTER — Emergency Department
Admission: EM | Admit: 2019-05-12 | Discharge: 2019-05-12 | Disposition: A | Discharge status: Home / Self Care | Payer: Managed Care, Other (non HMO) | Attending: Emergency Medicine | Admitting: Emergency Medicine

## 2019-05-12 DIAGNOSIS — R1030 Lower abdominal pain, unspecified: Secondary | ICD-10-CM | POA: Insufficient documentation

## 2019-05-12 DIAGNOSIS — K219 Gastro-esophageal reflux disease without esophagitis: Secondary | ICD-10-CM | POA: Insufficient documentation

## 2019-05-12 DIAGNOSIS — F17210 Nicotine dependence, cigarettes, uncomplicated: Secondary | ICD-10-CM | POA: Insufficient documentation

## 2019-05-12 DIAGNOSIS — I2510 Atherosclerotic heart disease of native coronary artery without angina pectoris: Secondary | ICD-10-CM | POA: Diagnosis not present

## 2019-05-12 DIAGNOSIS — I119 Hypertensive heart disease without heart failure: Secondary | ICD-10-CM | POA: Diagnosis not present

## 2019-05-12 DIAGNOSIS — K589 Irritable bowel syndrome without diarrhea: Secondary | ICD-10-CM | POA: Insufficient documentation

## 2019-05-12 LAB — CBC
HCT: 48.9 % — ABNORMAL HIGH (ref 36.0–46.0)
Hemoglobin: 16 g/dL — ABNORMAL HIGH (ref 12.0–15.0)
MCH: 31.3 pg (ref 26.0–34.0)
MCHC: 32.7 g/dL (ref 30.0–36.0)
MCV: 95.5 fL (ref 80.0–100.0)
Platelets: 328 10*3/uL (ref 150–400)
RBC: 5.12 MIL/uL — ABNORMAL HIGH (ref 3.87–5.11)
RDW: 12.4 % (ref 11.5–15.5)
WBC: 12.7 10*3/uL — ABNORMAL HIGH (ref 4.0–10.5)
nRBC: 0 % (ref 0.0–0.2)

## 2019-05-12 LAB — COMPREHENSIVE METABOLIC PANEL
ALT: 47 U/L — ABNORMAL HIGH (ref 0–44)
AST: 32 U/L (ref 15–41)
Albumin: 3.9 g/dL (ref 3.5–5.0)
Alkaline Phosphatase: 85 U/L (ref 38–126)
Anion gap: 9 (ref 5–15)
BUN: 17 mg/dL (ref 6–20)
CO2: 26 mmol/L (ref 22–32)
Calcium: 9.4 mg/dL (ref 8.9–10.3)
Chloride: 105 mmol/L (ref 98–111)
Creatinine, Ser: 1.19 mg/dL — ABNORMAL HIGH (ref 0.44–1.00)
GFR calc Af Amer: >60 mL/min (ref >60)
GFR calc non Af Amer: 53 mL/min — ABNORMAL LOW (ref >60)
Glucose, Bld: 125 mg/dL — ABNORMAL HIGH (ref 70–99)
Potassium: 4.1 mmol/L (ref 3.5–5.1)
Sodium: 140 mmol/L (ref 135–145)
Total Bilirubin: 0.7 mg/dL (ref 0.3–1.2)
Total Protein: 7.6 g/dL (ref 6.5–8.1)

## 2019-05-12 LAB — URINALYSIS, COMPLETE (UACMP) WITH MICROSCOPIC
Bacteria, UA: NONE SEEN
Bilirubin Urine: NEGATIVE
Glucose, UA: NEGATIVE mg/dL
Hgb urine dipstick: NEGATIVE
Ketones, ur: NEGATIVE mg/dL
Leukocytes,Ua: NEGATIVE
Nitrite: NEGATIVE
Protein, ur: NEGATIVE mg/dL
Specific Gravity, Urine: 1.005 (ref 1.005–1.030)
WBC, UA: NONE SEEN WBC/hpf (ref 0–5)
pH: 5 (ref 5.0–8.0)

## 2019-05-12 LAB — LIPASE, BLOOD: Lipase: 42 U/L (ref 11–51)

## 2019-05-12 MED ORDER — DICYCLOMINE HCL 10 MG PO CAPS: 10.0000 mg | ORAL_CAPSULE | Freq: Four times a day (QID) | ORAL | 0 refills | Status: DC

## 2019-05-12 MED ORDER — KETOROLAC TROMETHAMINE 30 MG/ML IJ SOLN
15.0000 mg | Freq: Once | INTRAMUSCULAR | Status: AC
  Administered 2019-05-12: 04:00:00 15 mg via INTRAVENOUS
  Filled 2019-05-12: qty 1

## 2019-05-12 MED ORDER — IOHEXOL 300 MG/ML  SOLN
100.0000 mL | Freq: Once | INTRAMUSCULAR | Status: AC | PRN
  Administered 2019-05-12: 04:00:00 100 mL via INTRAVENOUS

## 2019-05-12 MED ORDER — DICYCLOMINE HCL 10 MG PO CAPS
10.0000 mg | ORAL_CAPSULE | Freq: Once | ORAL | Status: AC
  Administered 2019-05-12: 10 mg via ORAL
  Filled 2019-05-12: qty 1

## 2019-05-12 NOTE — ED Provider Notes (Signed)
[facility] Emergency Department Provider Note  ____________________________________________  Time seen: Approximately 3:40 AM  I have reviewed the triage vital signs and the nursing notes.   HISTORY  Chief Complaint Abdominal Pain   HPI Robyn Anderson is a 54 y.o. female with a history of IBS, Barrett's esophagus, GERD, hypertension, ovarian cyst, bilateral salpingo-oophorectomy who presents for evaluation of abdominal pain.  Patient reports diffuse crampy abdominal pain worse in the lower quadrants has been constant since this afternoon.  The pain is 7 out of 10 with intermittent episodes of 10 out of 10.  No nausea, vomiting, diarrhea, constipation, dysuria, hematuria, fever, chills, chest pain, shortness of breath, vaginal discharge.  Past Medical History:  Diagnosis Date  . Barrett esophagus   . Coronary artery disease   . GERD (gastroesophageal reflux disease)   . Headache    migraines 1-2/mo  . Hypertension   . Obesity (BMI 30-39.9)     Patient Active Problem List   Diagnosis Date Noted  . Migraine with aura and without status migrainosus, not intractable 04/06/2019  . Neuropathy 04/06/2019  . Pre-operative clearance 04/06/2019  . Cervical stenosis (uterine cervix) 03/31/2019  . Left ovarian cyst 03/31/2019  . Postmenopausal bleeding 01/20/2019  . Essential hypertension 04/22/2018  . Hypertriglyceridemia 04/22/2018  . History of chest pain 04/22/2018  . Tobacco abuse 04/22/2018    Past Surgical History:  Procedure Laterality Date  . ATHERECTOMY Right    renal  . CARPAL TUNNEL RELEASE Left   . CARPAL TUNNEL RELEASE Right 09/27/2015   Procedure: OPEN CARPAL TUNNEL RELEASE RIGHT HAND;  Surgeon: Leanor Kail, MD;  Location: [place];  Service: Orthopedics;  Laterality: Right;  . CESAREAN SECTION     x2  . COLONOSCOPY    . ESOPHAGOGASTRODUODENOSCOPY    . HYSTEROSCOPY WITH D & C N/A 04/18/2019   Procedure: DILATATION AND  CURETTAGE /HYSTEROSCOPY;  Surgeon: Aletha Halim, MD;  Location: [place];  Service: Gynecology;  Laterality: N/A;  . LAPAROSCOPIC SALPINGO OOPHERECTOMY Bilateral 04/18/2019   Procedure: LAPAROSCOPIC SALPINGO OOPHORECTOMY;  Surgeon: Aletha Halim, MD;  Location: [place];  Service: Gynecology;  Laterality: Bilateral;  . TUBAL LIGATION      Prior to Admission medications   Medication Sig Start Date End Date Taking? Authorizing Provider  dicyclomine (BENTYL) 10 MG capsule Take 1 capsule (10 mg total) by mouth 4 (four) times daily for 14 days. 05/12/19 05/26/19  Rudene Re, MD  lisinopril (ZESTRIL) 10 MG tablet Take 20 mg by mouth daily. 04/29/19   [provider]  lisinopril (ZESTRIL) 30 MG tablet TAKE 1 TABLET BY MOUTH EVERY DAY 03/06/19   Troy Sine, MD  nortriptyline (PAMELOR) 10 MG capsule Take 10 mg by mouth at bedtime. 1 capsule by mouth nightly for one week then increase to 2 capsules nightly    [provider]  omega-3 acid ethyl esters (LOVAZA) 1 g capsule Take 2 capsules by mouth 2 (two) times daily. 05/03/19   [provider]  omeprazole (PRILOSEC) 40 MG capsule Take 40 mg by mouth daily. 03/21/18   [provider]  oxyCODONE (OXY IR/ROXICODONE) 5 MG immediate release tablet Take 1 tablet (5 mg total) by mouth every 6 (six) hours as needed for severe pain. 04/18/19   Aletha Halim, MD  pregabalin (LYRICA) 50 MG capsule Take 50 mg by mouth 3 (three) times daily.    [provider]  SUMAtriptan (IMITREX) 25 MG tablet Take 25 mg  by mouth every 2 (two) hours as needed for migraine. May repeat in 2 hours if headache persists or recurs.    [provider]    Allergies Atorvastatin, Topamax [topiramate], and Vicodin [hydrocodone-acetaminophen]  Family History  Problem Relation Age of Onset  . Cancer Mother   . Hypertension Mother   . Cancer Father   . Hypertension Father   .  Hypertension Sister   . Hypertension Brother   . Diabetes Paternal Grandmother   . Stroke Maternal Grandmother   . Epilepsy Son     Social History Social History   Tobacco Use  . Smoking status: Current Every Day Smoker    Packs/day: 0.50    Years: 30.00    Pack years: 15.00    Types: Cigarettes  . Smokeless tobacco: Never Used  Substance Use Topics  . Alcohol use: No  . Drug use: No    Review of Systems  Constitutional: Negative for fever. Eyes: Negative for visual changes. ENT: Negative for sore throat. Neck: No neck pain  Cardiovascular: Negative for chest pain. Respiratory: Negative for shortness of breath. Gastrointestinal: + lower abdominal pain. No vomiting or diarrhea. Genitourinary: Negative for dysuria. Musculoskeletal: Negative for back pain. Skin: Negative for rash. Neurological: Negative for headaches, weakness or numbness. Psych: No SI or HI  ____________________________________________   PHYSICAL EXAM:  VITAL SIGNS: ED Triage Vitals [05/12/19 0155]  Enc Vitals Group     BP 106/67     Pulse Rate 89     Resp 20     Temp 97.8 F (36.6 C)     Temp Source Oral     SpO2 100 %     Weight 145 lb (65.8 kg)     Height 4\' 9"  (1.448 m)     Head Circumference      Peak Flow      Pain Score 7     Pain Loc      Pain Edu?      Excl. in [place]?     Constitutional: Alert and oriented. Well appearing and in no apparent distress. HEENT:      Head: Normocephalic and atraumatic.         Eyes: Conjunctivae are normal. Sclera is non-icteric.       Mouth/Throat: Mucous membranes are moist.       Neck: Supple with no signs of meningismus. Cardiovascular: Regular rate and rhythm. No murmurs, gallops, or rubs. 2+ symmetrical distal pulses are present in all extremities. No JVD. Respiratory: Normal respiratory effort. Lungs are clear to auscultation bilaterally. No wheezes, crackles, or rhonchi.  Gastrointestinal: Soft, diffuse mild tenderness palpation of the  lower quadrants, and non distended with positive bowel sounds. No rebound or guarding. Genitourinary: No CVA tenderness. Musculoskeletal: Nontender with normal range of motion in all extremities. No edema, cyanosis, or erythema of extremities. Neurologic: Normal speech and language. Face is symmetric. Moving all extremities. No gross focal neurologic deficits are appreciated. Skin: Skin is warm, dry and intact. No rash noted. Psychiatric: Mood and affect are normal. Speech and behavior are normal.  ____________________________________________   LABS (all labs ordered are listed, but only abnormal results are displayed)  Labs Reviewed  CBC - Abnormal; Notable for the following components:      Result Value   WBC 12.7 (*)    RBC 5.12 (*)    Hemoglobin 16.0 (*)    HCT 48.9 (*)    All other components within normal limits  COMPREHENSIVE METABOLIC PANEL - Abnormal;  Notable for the following components:   Glucose, Bld 125 (*)    Creatinine, Ser 1.19 (*)    ALT 47 (*)    GFR calc non Af Amer 53 (*)    All other components within normal limits  URINALYSIS, COMPLETE (UACMP) WITH MICROSCOPIC - Abnormal; Notable for the following components:   Color, Urine STRAW (*)    APPearance CLEAR (*)    All other components within normal limits  LIPASE, BLOOD   ____________________________________________  EKG  none  ____________________________________________  RADIOLOGY  I have personally reviewed the images performed during this visit and I agree with the Radiologist's read.   Interpretation by Radiologist:  CT ABDOMEN PELVIS W CONTRAST  Result Date: 05/12/2019 CLINICAL DATA:  Abdominal pain. Pain onset today. Patient reports hysterectomy 04/18/2019, no pain and until today. Electronic medical records reveals patient had hysteroscopy with D & C and bilateral salpingo oophorectomy. EXAM: CT ABDOMEN AND PELVIS WITH CONTRAST TECHNIQUE: Multidetector CT imaging of the abdomen and pelvis was  performed using the standard protocol following bolus administration of intravenous contrast. CONTRAST:  18mL OMNIPAQUE IOHEXOL 300 MG/ML  SOLN COMPARISON:  CT 12/17/2018 FINDINGS: Lower chest: Subsegmental linear atelectasis in the left lower lobe. No pleural fluid or focal airspace disease. Hepatobiliary: Scattered small low-density lesions throughout the liver, unchanged from prior exam. These likely represent small cysts or hemangiomas. Mild hepatic steatosis. Gallbladder physiologically distended, no calcified stone. No biliary dilatation. Pancreas: No ductal dilatation or inflammation. Spleen: Normal in size without focal abnormality. Adrenals/Urinary Tract: Normal adrenal glands. Slight prominence of both renal collecting systems no hydronephrosis. There is homogeneous enhancement with symmetric excretion on delayed phase imaging. Tiny hypodensity in the left kidney is too small to characterize. Urinary bladder is physiologically distended. No bladder wall thickening. Stomach/Bowel: Stomach is nondistended. Ligament of Treitz is in the right abdomen, duodenum does not cross the midline. Proximal jejunal small bowel in the right abdomen. Findings are unchanged from prior exam. No evidence of volvulus or mesenteric swirling. Cecum is located in the right lower abdomen, the appendix courses to the midline anteriorly and is normal. Small volume of colonic stool. No colonic wall thickening or inflammation. Few scattered colonic diverticula without diverticulitis. Vascular/Lymphatic: Minimal aortic atherosclerosis. No aortic aneurysm. The portal vein is patent. No enlarged lymph nodes in the abdomen or pelvis. Reproductive: The uterus is present, unremarkable by CT. No adnexal mass. Ovaries not visualized consistent with oophectomy. Other: No free fluid or intra-abdominal abscess. No pelvic abscess. No free air. Tiny fat containing umbilical hernia. No subcutaneous fluid collection. Musculoskeletal: There are no  acute or suspicious osseous abnormalities. IMPRESSION: 1. No acute abnormality in the abdomen/pelvis. No evidence of complication post recent hysteroscopy and bilateral salpingo oophorectomy. 2. 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 No evidence of bowel inflammation, volvulus or mesenteric swirling. This is chronic and unchanged from prior exam. 3. Mild colonic diverticulosis without diverticulitis. Aortic Atherosclerosis (ICD10-I70.0). Electronically Signed   By: Keith Rake M.D.   On: 05/12/2019 04:20      ____________________________________________   PROCEDURES  Procedure(s) performed: None Procedures Critical Care performed:  None ____________________________________________   INITIAL IMPRESSION / ASSESSMENT AND PLAN / ED COURSE  54 y.o. female with a history of IBS, Barrett's esophagus, GERD, hypertension, ovarian cyst, bilateral salpingo-oophorectomy who presents for evaluation of abdominal pain.  Patient is well-appearing with normal vital signs, abdomen is soft with mild diffuse lower quadrants tenderness with  no localized tenderness, rebound or guarding.  Differential diagnosis including complications from her recent salpingo-oophorectomy such as intra-abdominal abscess, bowel perforation, uterine perforation, diverticulitis, appendicitis, UTI, pyelonephritis, kidney stones, flareup of her IBS, SBO.  Plan for labs, urinalysis, CT abdomen pelvis. Will give toradol for pain.      _________________________ 4:28 AM on 05/12/2019 -----------------------------------------  CT and labs with no significant abnormalities, UA negative.  Patient's pain resolved after Toradol.  Will prescribe Bentyl.  Recommended follow-up  As part of my medical decision making, I reviewed the following data within the [place] notes reviewed and incorporated, Labs reviewed , Old chart reviewed,  Radiograph reviewed , Notes from prior ED visits and [place] Controlled Substance Database   Please note:  Patient was evaluated in Emergency Department today for the symptoms described in the history of present illness. Patient was evaluated in the context of the global COVID-19 pandemic, which necessitated consideration that the patient might be at risk for infection with the SARS-CoV-2 virus that causes COVID-19. Institutional protocols and algorithms that pertain to the evaluation of patients at risk for COVID-19 are in a state of rapid change based on information released by regulatory bodies including the CDC and federal and state organizations. These policies and algorithms were followed during the patient's care in the ED.  Some ED evaluations and interventions may be delayed as a result of limited staffing during the pandemic.   ____________________________________________   FINAL CLINICAL IMPRESSION(S) / ED DIAGNOSES   Final diagnoses:  Lower abdominal pain      NEW MEDICATIONS STARTED DURING THIS VISIT:  ED Discharge Orders         Ordered    dicyclomine (BENTYL) 10 MG capsule  4 times daily     05/12/19 0426           Note:  This document was prepared using Dragon voice recognition software and may include unintentional dictation errors.    Alfred Levins, [place], MD 05/12/19 [phone]

## 2019-05-12 NOTE — ED Triage Notes (Signed)
Pt in with co abd pain that started today, states to mid abd. Pt did have hysterectomy 12/29 but has not has any problems since. Pt denies any n.v.d or dysuria. Pt denies any fever.

## 2019-05-12 NOTE — Discharge Instructions (Signed)

## 2019-05-17 ENCOUNTER — Other Ambulatory Visit: Payer: Self-pay

## 2019-05-17 ENCOUNTER — Encounter: Payer: Self-pay | Admitting: Obstetrics and Gynecology

## 2019-05-17 ENCOUNTER — Ambulatory Visit (INDEPENDENT_AMBULATORY_CARE_PROVIDER_SITE_OTHER): Payer: Managed Care, Other (non HMO) | Admitting: Obstetrics and Gynecology

## 2019-05-17 VITALS — BP 129/87 | HR 79 | Wt 145.5 lb

## 2019-05-17 DIAGNOSIS — Z9889 Other specified postprocedural states: Secondary | ICD-10-CM | POA: Diagnosis not present

## 2019-05-17 DIAGNOSIS — Z09 Encounter for follow-up examination after completed treatment for conditions other than malignant neoplasm: Secondary | ICD-10-CM

## 2019-05-17 NOTE — Progress Notes (Signed)
[facility] 05/17/2019  CC: regular post op visit   Subjective:   Patient is s/p 12/29 hysteroscopy, d&c, and l/s BSO for post menopausal bleeding and h/o ovarian cysts; she was discharged to home from the pacu. Final pathology for both were negative.  She was seen on 1/22 in the ED for abdominal pain and had a neg ct and given bentyl and was seen by her pcp yesterday and given hyoscyamine.  Currently, asymptomatic.   Review of Systems Pertinent items are noted in HPI.    Objective:    BP 129/87   Pulse 79   Wt 145 lb 8 oz (66 kg)   LMP 07/30/2017   BMI 31.49 kg/m  NAD Abdomen: soft, nttp, nd. C/d/i incision w/ no s/s of infection. rlq one has small scab over it  Assessment:    Doing well postoperatively.   Plan:   Follow up PRN. D/w her to let us know if she ever has any future postmenopausal bleeding or spotting   Aletha Halim, Brooke Bonito MD Attending Center for [employer] (Faculty Practice) 05/17/2019 Time: [phone]

## 2019-06-26 ENCOUNTER — Encounter: Payer: Self-pay | Admitting: Obstetrics and Gynecology

## 2019-06-26 ENCOUNTER — Ambulatory Visit (INDEPENDENT_AMBULATORY_CARE_PROVIDER_SITE_OTHER): Payer: Managed Care, Other (non HMO) | Admitting: Obstetrics and Gynecology

## 2019-06-26 ENCOUNTER — Other Ambulatory Visit: Payer: Self-pay

## 2019-06-26 VITALS — BP 117/84 | HR 89 | Wt 143.0 lb

## 2019-06-26 DIAGNOSIS — N951 Menopausal and female climacteric states: Secondary | ICD-10-CM | POA: Diagnosis not present

## 2019-06-26 NOTE — Progress Notes (Signed)
Obstetrics and Gynecology Visit Return Patient Evaluation  Appointment Date: 06/26/2019  Primary Care Provider: [place], [place] [facility]  Chief Complaint: hot flashes, night sweats  History of Present Illness:  Robyn Anderson is a 54 y.o. with above CC. S/s started after hyst. No vaginal dryness, dyspareunias.   Review of Systems: as noted in the History of Present Illness.  Patient Active Problem List   Diagnosis Date Noted  . Migraine with aura and without status migrainosus, not intractable 04/06/2019  . Neuropathy 04/06/2019  . Pre-operative clearance 04/06/2019  . Cervical stenosis (uterine cervix) 03/31/2019  . Left ovarian cyst 03/31/2019  . Postmenopausal bleeding 01/20/2019  . Essential hypertension 04/22/2018  . Hypertriglyceridemia 04/22/2018  . History of chest pain 04/22/2018  . Tobacco abuse 04/22/2018   Medications:  Anda Latina had no medications administered during this visit. Current Outpatient Medications  Medication Sig Dispense Refill  . dicyclomine (BENTYL) 10 MG capsule Take 1 capsule (10 mg total) by mouth 4 (four) times daily for 14 days. 56 capsule 0  . lisinopril (ZESTRIL) 30 MG tablet TAKE 1 TABLET BY MOUTH EVERY DAY 90 tablet 1  . nortriptyline (PAMELOR) 10 MG capsule Take 10 mg by mouth at bedtime. 1 capsule by mouth nightly for one week then increase to 2 capsules nightly    . omega-3 acid ethyl esters (LOVAZA) 1 g capsule Take 2 capsules by mouth 2 (two) times daily.    Marland Kitchen omeprazole (PRILOSEC) 40 MG capsule Take 40 mg by mouth daily.    . pregabalin (LYRICA) 50 MG capsule Take 50 mg by mouth 3 (three) times daily.    . SUMAtriptan (IMITREX) 25 MG tablet Take 25 mg by mouth every 2 (two) hours as needed for migraine. May repeat in 2 hours if headache persists or recurs.     No current facility-administered medications for this visit.    Allergies: is allergic to atorvastatin; topamax [topiramate]; and  vicodin [hydrocodone-acetaminophen].  Physical Exam:  BP 117/84   Pulse 89   Wt 143 lb (64.9 kg)   LMP 07/30/2017   BMI 30.94 kg/m  Body mass index is 30.94 kg/m. General appearance: Well nourished, well developed female in no acute distress.  Neuro/Psych:  Normal mood and affect.     Assessment: pt doing well  Plan:  1. Menopausal and female climacteric states D/w her re: r/b/a including behavioral interventions. I told her that since she has systemic s/s and h/o HTN, I don't recommend systemic estrogen therapy, but could consider local estrogen in the future if she has s/s.   Given on a TCA, will try gabapentin.    RTC: 3 months  Durene Romans MD Attending Center for [employer] [facility])

## 2019-06-29 ENCOUNTER — Encounter: Payer: Self-pay | Admitting: Obstetrics and Gynecology

## 2019-06-29 MED ORDER — GABAPENTIN 300 MG PO CAPS: 300.0000 mg | ORAL_CAPSULE | Freq: Three times a day (TID) | ORAL | 2 refills | Status: DC

## 2019-08-22 ENCOUNTER — Other Ambulatory Visit: Payer: Self-pay

## 2019-08-22 ENCOUNTER — Encounter (HOSPITAL_COMMUNITY): Payer: Self-pay

## 2019-08-22 ENCOUNTER — Ambulatory Visit (HOSPITAL_COMMUNITY)
Admission: EM | Admit: 2019-08-22 | Discharge: 2019-08-22 | Disposition: A | Discharge status: Home / Self Care | Payer: Managed Care, Other (non HMO) | Attending: Physician Assistant | Admitting: Physician Assistant

## 2019-08-22 DIAGNOSIS — F17210 Nicotine dependence, cigarettes, uncomplicated: Secondary | ICD-10-CM | POA: Insufficient documentation

## 2019-08-22 DIAGNOSIS — E669 Obesity, unspecified: Secondary | ICD-10-CM | POA: Diagnosis not present

## 2019-08-22 DIAGNOSIS — K219 Gastro-esophageal reflux disease without esophagitis: Secondary | ICD-10-CM | POA: Diagnosis not present

## 2019-08-22 DIAGNOSIS — I2510 Atherosclerotic heart disease of native coronary artery without angina pectoris: Secondary | ICD-10-CM | POA: Diagnosis not present

## 2019-08-22 DIAGNOSIS — J069 Acute upper respiratory infection, unspecified: Secondary | ICD-10-CM | POA: Insufficient documentation

## 2019-08-22 DIAGNOSIS — G629 Polyneuropathy, unspecified: Secondary | ICD-10-CM | POA: Diagnosis not present

## 2019-08-22 DIAGNOSIS — I10 Essential (primary) hypertension: Secondary | ICD-10-CM | POA: Insufficient documentation

## 2019-08-22 DIAGNOSIS — Z7901 Long term (current) use of anticoagulants: Secondary | ICD-10-CM | POA: Insufficient documentation

## 2019-08-22 DIAGNOSIS — Z79899 Other long term (current) drug therapy: Secondary | ICD-10-CM | POA: Diagnosis not present

## 2019-08-22 DIAGNOSIS — E781 Pure hyperglyceridemia: Secondary | ICD-10-CM | POA: Diagnosis not present

## 2019-08-22 DIAGNOSIS — Z6830 Body mass index (BMI) 30.0-30.9, adult: Secondary | ICD-10-CM | POA: Insufficient documentation

## 2019-08-22 DIAGNOSIS — J029 Acute pharyngitis, unspecified: Secondary | ICD-10-CM | POA: Insufficient documentation

## 2019-08-22 DIAGNOSIS — Z8719 Personal history of other diseases of the digestive system: Secondary | ICD-10-CM | POA: Diagnosis not present

## 2019-08-22 DIAGNOSIS — Z20822 Contact with and (suspected) exposure to covid-19: Secondary | ICD-10-CM | POA: Diagnosis not present

## 2019-08-22 MED ORDER — BENZONATATE 100 MG PO CAPS: 100.0000 mg | ORAL_CAPSULE | Freq: Three times a day (TID) | ORAL | 0 refills | Status: DC

## 2019-08-22 MED ORDER — CETIRIZINE HCL 10 MG PO TABS: 10.0000 mg | ORAL_TABLET | Freq: Every day | ORAL | 0 refills | Status: DC

## 2019-08-22 MED ORDER — FLUTICASONE PROPIONATE 50 MCG/ACT NA SUSP: 1.0000 | Freq: Every day | NASAL | 0 refills | Status: DC

## 2019-08-22 NOTE — Discharge Instructions (Signed)
Continue your usual headache treatments. -You may add 2 regular strength Tylenol to your usual regimen which will also aid in body ache and chills.  You may also take ibuprofen if this aids your headache.  When she begin taking Zyrtec daily and using the Flonase daily as directed  Use the Tessalon for cough as needed  If you have shortness of breath, chest pain, high fever or if you to return or report to the emergency department if after hours  If you have not significantly improved in 7 to 10 days please return for reevaluation.  If your Covid-19 test is positive, you will receive a phone call from [facility] regarding your results. Negative test results are not called. Both positive and negative results area always visible on MyChart. If you do not have a MyChart account, sign up instructions are in your discharge papers.   Persons who are directed to care for themselves at home may discontinue isolation under the following conditions:   At least 10 days have passed since symptom onset and  At least 24 hours have passed without running a fever (this means without the use of fever-reducing medications) and  Other symptoms have improved.  Persons infected with COVID-19 who never develop symptoms may discontinue isolation and other precautions 10 days after the date of their first positive COVID-19 test.

## 2019-08-22 NOTE — ED Provider Notes (Signed)
[place]    CSN: [id number] Arrival date & time: 08/22/19  0906      History   Chief Complaint Chief Complaint  Patient presents with  . Chills  . Generalized Body Aches  . Headache    HPI Robyn Anderson is a 54 y.o. female.   Patient reports to urgent care for 2-day history of headache, body ache and chills.  She also endorses nasal congestion and a slight sore throat.  She has had a mild cough occasionally.  This has been nonproductive.  Denies any chest pain or shortness of breath.  She denies a measured fever at home.  Denies nausea, vomiting diarrhea.  Denies change in taste or smell.  She reports she has a history of migraines and has been using her usual migraine medicines however this headache was slightly different.  She reports it did respond well to ibuprofen.  She describes the headache to be frontal and above her eyes.  Denies any numbness, tingling or weakness.  Denies visual changes.  Denies ear pain or ear fullness.     Past Medical History:  Diagnosis Date  . Barrett esophagus   . Coronary artery disease   . GERD (gastroesophageal reflux disease)   . Headache    migraines 1-2/mo  . Hypertension   . Obesity (BMI 30-39.9)   . Postmenopausal bleeding 01/20/2019    Patient Active Problem List   Diagnosis Date Noted  . Migraine with aura and without status migrainosus, not intractable 04/06/2019  . Neuropathy 04/06/2019  . Cervical stenosis (uterine cervix) 03/31/2019  . Essential hypertension 04/22/2018  . Hypertriglyceridemia 04/22/2018  . History of chest pain 04/22/2018  . Tobacco abuse 04/22/2018    Past Surgical History:  Procedure Laterality Date  . ATHERECTOMY Right    renal  . CARPAL TUNNEL RELEASE Left   . CARPAL TUNNEL RELEASE Right 09/27/2015   Procedure: OPEN CARPAL TUNNEL RELEASE RIGHT HAND;  Surgeon: Leanor Kail, MD;  Location: [facility];  Service: Orthopedics;  Laterality: Right;  . CESAREAN SECTION     x2    . COLONOSCOPY    . ESOPHAGOGASTRODUODENOSCOPY    . HYSTEROSCOPY WITH D & C N/A 04/18/2019   Procedure: DILATATION AND CURETTAGE /HYSTEROSCOPY;  Surgeon: Aletha Halim, MD;  Location: [facility];  Service: Gynecology;  Laterality: N/A;  . LAPAROSCOPIC SALPINGO OOPHERECTOMY Bilateral 04/18/2019   Procedure: LAPAROSCOPIC SALPINGO OOPHORECTOMY;  Surgeon: Aletha Halim, MD;  Location: [facility];  Service: Gynecology;  Laterality: Bilateral;  . TUBAL LIGATION      OB History    Gravida  2   Para  2   Term      Preterm      AB      Living  2     SAB      TAB      Ectopic      Multiple      Live Births  2            Home Medications    Prior to Admission medications   Medication Sig Start Date End Date Taking? Authorizing Provider  benzonatate (TESSALON) 100 MG capsule Take 1 capsule (100 mg total) by mouth every 8 (eight) hours. 08/22/19   Anatalia Kronk, Marguerita Beards, PA-C  cetirizine (ZYRTEC ALLERGY) 10 MG tablet Take 1 tablet (10 mg total) by mouth daily. 08/22/19   Shonta Bourque, Marguerita Beards, PA-C  dicyclomine (BENTYL) 10 MG capsule Take 1 capsule (10 mg  total) by mouth 4 (four) times daily for 14 days. 05/12/19 05/26/19  Rudene Re, MD  fluticasone [facility]) 50 MCG/ACT nasal spray Place 1 spray into both nostrils daily. 08/22/19   Cher Franzoni, Marguerita Beards, PA-C  gabapentin (NEURONTIN) 300 MG capsule Take 1 capsule (300 mg total) by mouth 3 (three) times daily. 06/29/19 07/29/19  Aletha Halim, MD  lisinopril (ZESTRIL) 30 MG tablet TAKE 1 TABLET BY MOUTH EVERY DAY 03/06/19   Troy Sine, MD  nortriptyline (PAMELOR) 10 MG capsule Take 10 mg by mouth at bedtime. 1 capsule by mouth nightly for one week then increase to 2 capsules nightly    [provider]  omega-3 acid ethyl esters (LOVAZA) 1 g capsule Take 2 capsules by mouth 2 (two) times daily. 05/03/19   [provider]  omeprazole (PRILOSEC) 40 MG capsule Take 40 mg by mouth daily. 03/21/18    [provider]  oxyCODONE (OXY IR/ROXICODONE) 5 MG immediate release tablet Take 1 tablet (5 mg total) by mouth every 6 (six) hours as needed for severe pain. Patient not taking: Reported on 05/17/2019 04/18/19   Aletha Halim, MD  pregabalin (LYRICA) 50 MG capsule Take 50 mg by mouth 3 (three) times daily.    [provider]  SUMAtriptan (IMITREX) 25 MG tablet Take 25 mg by mouth every 2 (two) hours as needed for migraine. May repeat in 2 hours if headache persists or recurs.    [provider]    Family History Family History  Problem Relation Age of Onset  . Cancer Mother   . Hypertension Mother   . Cancer Father   . Hypertension Father   . Hypertension Sister   . Hypertension Brother   . Diabetes Paternal Grandmother   . Stroke Maternal Grandmother   . Epilepsy Son     Social History Social History   Tobacco Use  . Smoking status: Current Every Day Smoker    Packs/day: 0.50    Years: 30.00    Pack years: 15.00    Types: Cigarettes  . Smokeless tobacco: Never Used  Substance Use Topics  . Alcohol use: No  . Drug use: No     Allergies   Atorvastatin, Topamax [topiramate], and Vicodin [hydrocodone-acetaminophen]   Review of Systems Review of Systems  Per HPI Physical Exam Triage Vital Signs ED Triage Vitals  Enc Vitals Group     BP 08/22/19 0947 117/79     Pulse Rate 08/22/19 0947 87     Resp 08/22/19 0947 17     Temp 08/22/19 0947 98.2 F (36.8 C)     Temp Source 08/22/19 0947 Oral     SpO2 08/22/19 0947 100 %     Weight --      Height --      Head Circumference --      Peak Flow --      Pain Score 08/22/19 0945 7     Pain Loc --      Pain Edu? --      Excl. in [place]? --    No data found.  Updated Vital Signs BP 117/79 (BP Location: Right Arm)   Pulse 87   Temp 98.2 F (36.8 C) (Oral)   Resp 17   LMP 07/30/2017   SpO2 100%   Visual Acuity Right Eye Distance:   Left Eye Distance:   Bilateral Distance:     Right Eye Near:   Left Eye Near:    Bilateral Near:  Physical Exam Vitals and nursing note reviewed.  Constitutional:      General: She is not in acute distress.    Appearance: She is well-developed. She is ill-appearing. She is not toxic-appearing.  HENT:     Head: Normocephalic and atraumatic.     Nose:     Comments: Turbinates swollen bilaterally.  Pink to red appearance.  There is clear nasal discharge.    Mouth/Throat:     Mouth: Mucous membranes are moist.     Comments: Mild postnasal drip visible Eyes:     Extraocular Movements: Extraocular movements intact.     Conjunctiva/sclera: Conjunctivae normal.     Pupils: Pupils are equal, round, and reactive to light.  Cardiovascular:     Rate and Rhythm: Normal rate and regular rhythm.     Heart sounds: No murmur.  Pulmonary:     Effort: Pulmonary effort is normal. No respiratory distress.     Breath sounds: Normal breath sounds. No wheezing, rhonchi or rales.  Abdominal:     Palpations: Abdomen is soft.     Tenderness: There is no abdominal tenderness.  Musculoskeletal:        General: Normal range of motion.     Cervical back: Normal range of motion and neck supple.  Skin:    General: Skin is warm and dry.     Findings: No rash.  Neurological:     Mental Status: She is alert and oriented to person, place, and time.     Cranial Nerves: No dysarthria or facial asymmetry.      UC Treatments / Results  Labs (all labs ordered are listed, but only abnormal results are displayed) Labs Reviewed  SARS CORONAVIRUS 2 (TAT 6-24 HRS)    EKG   Radiology No results found.  Procedures Procedures (including critical care time)  Medications Ordered in UC Medications - No data to display  Initial Impression / Assessment and Plan / UC Course  I have reviewed the triage vital signs and the nursing notes.  Pertinent labs & imaging results that were available during my care of the patient were reviewed by me and  considered in my medical decision making (see chart for details).     #Viral URI Patient is 54 year old past medical history of migraines presenting with symptoms consistent with a viral upper respiratory infection.  Vital signs are all stable and she is afebrile.  We will treat symptomatically and recommend continued headache medications as previously prescribed.  Covid PCR sent.  Return and follow-up precautions were discussed.  Emergency department precautions were discussed with patient.  Expectation of symptom duration was also discussed.  Patient verbalized understanding of the plan of care.   Final Clinical Impressions(s) / UC Diagnoses   Final diagnoses:  Viral upper respiratory tract infection     Discharge Instructions     Continue your usual headache treatments. -You may add 2 regular strength Tylenol to your usual regimen which will also aid in body ache and chills.  You may also take ibuprofen if this aids your headache.  When she begin taking Zyrtec daily and using the Flonase daily as directed  Use the Tessalon for cough as needed  If you have shortness of breath, chest pain, high fever or if you to return or report to the emergency department if after hours  If you have not significantly improved in 7 to 10 days please return for reevaluation.  If your Covid-19 test is positive, you will receive a phone call  from [facility] regarding your results. Negative test results are not called. Both positive and negative results area always visible on MyChart. If you do not have a MyChart account, sign up instructions are in your discharge papers.   Persons who are directed to care for themselves at home may discontinue isolation under the following conditions:  . At least 10 days have passed since symptom onset and . At least 24 hours have passed without running a fever (this means without the use of fever-reducing medications) and . Other symptoms have  improved.  Persons infected with COVID-19 who never develop symptoms may discontinue isolation and other precautions 10 days after the date of their first positive COVID-19 test.     ED Prescriptions    Medication Sig Dispense Auth. Provider   cetirizine (ZYRTEC ALLERGY) 10 MG tablet Take 1 tablet (10 mg total) by mouth daily. 30 tablet Jimmie Rueter, Marguerita Beards, PA-C   fluticasone (FLONASE) 50 MCG/ACT nasal spray Place 1 spray into both nostrils daily. 11.1 mL Jakylah Bassinger, Marguerita Beards, PA-C   benzonatate (TESSALON) 100 MG capsule Take 1 capsule (100 mg total) by mouth every 8 (eight) hours. 21 capsule Cola Highfill, Marguerita Beards, PA-C     PDMP not reviewed this encounter.   Purnell Shoemaker, PA-C 08/22/19 1056

## 2019-08-22 NOTE — ED Triage Notes (Signed)
Pt presents with generalized body aches, chills, and headache X 2 days.  Pt has Hx of migraines.

## 2019-08-23 LAB — SARS CORONAVIRUS 2 (TAT 6-24 HRS): SARS Coronavirus 2: NEGATIVE

## 2019-09-12 ENCOUNTER — Other Ambulatory Visit: Payer: Self-pay | Admitting: Cardiovascular Disease

## 2019-09-15 ENCOUNTER — Other Ambulatory Visit: Payer: Self-pay

## 2019-09-15 MED ORDER — LISINOPRIL 30 MG PO TABS: 30.0000 mg | ORAL_TABLET | Freq: Every day | ORAL | 1 refills | Status: DC

## 2019-10-17 ENCOUNTER — Ambulatory Visit: Payer: Managed Care, Other (non HMO) | Admitting: Cardiovascular Disease

## 2019-10-25 ENCOUNTER — Encounter: Payer: Self-pay | Admitting: Cardiovascular Disease

## 2019-10-25 ENCOUNTER — Ambulatory Visit: Payer: Managed Care, Other (non HMO) | Admitting: Cardiovascular Disease

## 2019-10-25 ENCOUNTER — Other Ambulatory Visit: Payer: Self-pay

## 2019-10-25 DIAGNOSIS — I10 Essential (primary) hypertension: Secondary | ICD-10-CM | POA: Diagnosis not present

## 2019-10-25 DIAGNOSIS — Z720 Tobacco use: Secondary | ICD-10-CM

## 2019-10-25 DIAGNOSIS — E781 Pure hyperglyceridemia: Secondary | ICD-10-CM

## 2019-10-25 DIAGNOSIS — Z87898 Personal history of other specified conditions: Secondary | ICD-10-CM | POA: Diagnosis not present

## 2019-10-25 NOTE — Assessment & Plan Note (Signed)
Ongoing tobacco abuse of 1/2 pack/day recalcitrant to respect modification.

## 2019-10-25 NOTE — Progress Notes (Signed)
10/25/2019 [name]   11-Mar-1966  [id number]  Primary Physician Danae Orleans, MD Primary Cardiologist: Lorretta Harp MD Lupe Carney, [place]  HPI:  Robyn Anderson is a 54 y.o.  mild to moderately overweight divorced Caucasian female mother of 2, grandmother of 4 grandchildren referred by the emergency room for cardiovascular evaluation because of recent episode of atypical chest pain. She works as a Gaffer at [employer].  I last saw her for a virtual telemedicine video visit 07/15/2018.  Her cardiac risk profile is notable for ongoing tobacco abuse of 1/2 pack/day for the last 35 years, treated hypertension and hypertriglyceridemia. She is never had a heart attack or stroke. There is no family history of heart disease. She was seen in the emergency room on 03/31/2018 with chest pain. She ruled out for myocardial infarction. She has had occasional chest pain since that time which occurs randomly last for seconds to minutes at a time.  She had a coronary CTA performed 05/24/2018 which revealed a coronary calcium score of 0 and no evidence of CAD.  Since I saw her virtually a year ago she has done well.  She continues to smoke 1/2 pack a day.  She is on lisinopril with well-controlled blood pressures.  She said no further chest pain.   Current Meds  Medication Sig  . albuterol (VENTOLIN HFA) 108 (90 Base) MCG/ACT inhaler Inhale into the lungs.  . Cholecalciferol (VITAMIN D3) 1.25 MG (50000 UT) CAPS Take 1 capsule by mouth once a week.  . esomeprazole (NEXIUM) 40 MG capsule Take 40 mg by mouth daily at 12 noon.  . hyoscyamine (LEVSIN) 0.125 MG tablet Take by mouth 3 (three) times daily as needed.  Marland Kitchen lisinopril (ZESTRIL) 30 MG tablet Take 1 tablet (30 mg total) by mouth daily.  . nortriptyline (PAMELOR) 10 MG capsule Take 10 mg by mouth at bedtime. 1 capsule by mouth nightly for one week then increase to 2 capsules nightly  . pregabalin (LYRICA) 50 MG capsule Take 50 mg  by mouth 3 (three) times daily.  . SUMAtriptan (IMITREX) 25 MG tablet Take 25 mg by mouth every 2 (two) hours as needed for migraine. May repeat in 2 hours if headache persists or recurs.     Allergies  Allergen Reactions  . Atorvastatin Other (See Comments)    Muscle pain (generalized)  . Topamax [Topiramate]     tremors  . Vicodin [Hydrocodone-Acetaminophen] Nausea And Vomiting    Social History   Socioeconomic History  . Marital status: Single    Spouse name: Not on file  . Number of children: 2  . Years of education: Not on file  . Highest education level: Not on file  Occupational History    Comment: shift work  Tobacco Use  . Smoking status: Current Every Day Smoker    Packs/day: 0.50    Years: 30.00    Pack years: 15.00    Types: Cigarettes  . Smokeless tobacco: Never Used  Substance and Sexual Activity  . Alcohol use: No  . Drug use: No  . Sexual activity: Yes    Birth control/protection: Surgical  Other Topics Concern  . Not on file  Social History Narrative   Lives with boyfriend   Social Determinants of Health   Financial Resource Strain:   . Difficulty of Paying Living Expenses:   Food Insecurity:   . Worried About Charity fundraiser in the Last Year:   . [employer]  of Food in the Last Year:   Transportation Needs:   . Film/video editor (Medical):   Marland Kitchen Lack of Transportation (Non-Medical):   Physical Activity:   . Days of Exercise per Week:   . Minutes of Exercise per Session:   Stress:   . Feeling of Stress :   Social Connections:   . Frequency of Communication with Friends and Family:   . Frequency of Social Gatherings with Friends and Family:   . Attends Religious Services:   . Active Member of Clubs or Organizations:   . Attends Archivist Meetings:   Marland Kitchen Marital Status:   Intimate Partner Violence:   . Fear of Current or Ex-Partner:   . Emotionally Abused:   Marland Kitchen Physically Abused:   . Sexually Abused:      Review of  Systems: General: negative for chills, fever, night sweats or weight changes.  Cardiovascular: negative for chest pain, dyspnea on exertion, edema, orthopnea, palpitations, paroxysmal nocturnal dyspnea or shortness of breath Dermatological: negative for rash Respiratory: negative for cough or wheezing Urologic: negative for hematuria Abdominal: negative for nausea, vomiting, diarrhea, bright red blood per rectum, melena, or hematemesis Neurologic: negative for visual changes, syncope, or dizziness All other systems reviewed and are otherwise negative except as noted above.    Blood pressure 104/70, pulse 98, height 4\' 10"  (1.473 m), weight 154 lb 3.2 oz (69.9 kg), last menstrual period 07/30/2017, SpO2 98 %.  General appearance: alert and no distress Neck: no adenopathy, no carotid bruit, no JVD, supple, symmetrical, trachea midline and thyroid not enlarged, symmetric, no tenderness/mass/nodules Lungs: clear to auscultation bilaterally Heart: regular rate and rhythm, S1, S2 normal, no murmur, click, rub or gallop Extremities: extremities normal, atraumatic, no cyanosis or edema Pulses: 2+ and symmetric Skin: Skin color, texture, turgor normal. No rashes or lesions Neurologic: Alert and oriented X 3, normal strength and tone. Normal symmetric reflexes. Normal coordination and gait  EKG sinus rhythm at 98 without ST or T wave changes.  Personally reviewed this EKG.  ASSESSMENT AND PLAN:   Essential hypertension History of essential hypertension on lisinopril blood pressure measured today at 104/70.  Hypertriglyceridemia History of hypertriglyceridemia not on any lipid-lowering medications followed by her PCP  Tobacco abuse Ongoing tobacco abuse of 1/2 pack/day recalcitrant to respect modification.  History of chest pain History of atypical chest pain in the past with a coronary calcium score of 0.  She has had no further chest pain.      Lorretta Harp MD FACP,FACC,FAHA,  [facility] 10/25/2019 2:29 PM

## 2019-10-25 NOTE — Patient Instructions (Signed)
Medication Instructions:  Your physician recommends that you continue on your current medications as directed. Please refer to the Current Medication list given to you today.  Lab Work: REQUESTING FROM YOUR PRIMARY CARE   Testing/Procedures: NONE   Follow-Up: AS NEEDED

## 2019-10-25 NOTE — Assessment & Plan Note (Signed)
History of essential hypertension on lisinopril blood pressure measured today at 104/70.

## 2019-10-25 NOTE — Assessment & Plan Note (Signed)
History of hypertriglyceridemia not on any lipid-lowering medications followed by her PCP

## 2019-10-25 NOTE — Assessment & Plan Note (Addendum)
History of atypical chest pain in the past with a coronary calcium score of 0.  She has had no further chest pain.

## 2019-12-12 ENCOUNTER — Ambulatory Visit (HOSPITAL_COMMUNITY)
Admission: EM | Admit: 2019-12-12 | Discharge: 2019-12-12 | Disposition: A | Discharge status: Home / Self Care | Payer: Managed Care, Other (non HMO) | Attending: Physician Assistant | Admitting: Physician Assistant

## 2019-12-12 ENCOUNTER — Encounter (HOSPITAL_COMMUNITY): Payer: Self-pay

## 2019-12-12 ENCOUNTER — Other Ambulatory Visit: Payer: Self-pay

## 2019-12-12 DIAGNOSIS — G43809 Other migraine, not intractable, without status migrainosus: Secondary | ICD-10-CM | POA: Diagnosis not present

## 2019-12-12 MED ORDER — SUMATRIPTAN SUCCINATE 6 MG/0.5ML ~~LOC~~ SOLN
SUBCUTANEOUS | Status: AC
  Filled 2019-12-12: qty 0.5

## 2019-12-12 MED ORDER — METOCLOPRAMIDE HCL 5 MG/ML IJ SOLN
INTRAMUSCULAR | Status: AC
  Filled 2019-12-12: qty 2

## 2019-12-12 MED ORDER — METOCLOPRAMIDE HCL 5 MG/ML IJ SOLN
5.0000 mg | Freq: Once | INTRAMUSCULAR | Status: AC
  Administered 2019-12-12: 5 mg via INTRAMUSCULAR

## 2019-12-12 MED ORDER — ONDANSETRON HCL 4 MG PO TABS: 4.0000 mg | ORAL_TABLET | Freq: Three times a day (TID) | ORAL | 0 refills | Status: AC | PRN

## 2019-12-12 MED ORDER — DEXAMETHASONE SODIUM PHOSPHATE 10 MG/ML IJ SOLN
INTRAMUSCULAR | Status: AC
  Filled 2019-12-12: qty 1

## 2019-12-12 MED ORDER — SUMATRIPTAN SUCCINATE 6 MG/0.5ML ~~LOC~~ SOLN
6.0000 mg | Freq: Once | SUBCUTANEOUS | Status: AC
  Administered 2019-12-12: 6 mg via SUBCUTANEOUS

## 2019-12-12 MED ORDER — KETOROLAC TROMETHAMINE 30 MG/ML IJ SOLN
30.0000 mg | Freq: Once | INTRAMUSCULAR | Status: AC
  Administered 2019-12-12: 30 mg via INTRAMUSCULAR

## 2019-12-12 MED ORDER — KETOROLAC TROMETHAMINE 30 MG/ML IJ SOLN
INTRAMUSCULAR | Status: AC
  Filled 2019-12-12: qty 1

## 2019-12-12 MED ORDER — DEXAMETHASONE SODIUM PHOSPHATE 10 MG/ML IJ SOLN
10.0000 mg | Freq: Once | INTRAMUSCULAR | Status: AC
  Administered 2019-12-12: 10 mg via INTRAMUSCULAR

## 2019-12-12 NOTE — ED Triage Notes (Signed)
Pt c/o migraine. Pt c/o photosensitity, nausea, and blurred visionx4 days. Pt states vomited 4-5 times yesterday

## 2019-12-12 NOTE — Discharge Instructions (Signed)
Use zofran only if unable to keep liquids down  Call your neurologist for further management of migraines  If sudden changes, severely worsening headache, other major changes in headache go to the Emergency department

## 2019-12-12 NOTE — ED Provider Notes (Signed)
[place]    CSN: [id number] Arrival date & time: 12/12/19  1107      History   Chief Complaint Chief Complaint  Patient presents with  . Migraine    HPI Robyn Anderson is a 54 y.o. female.   Patient with history of migraines presents for migraine headache.  She reports she has had migraine headaches for the last 4 to 5 days.  She is had vomiting and photophobia.  Occasional blurry vision.  She reports this is very typical of her migraines.  She saw her neurologist yesterday for same however is not been able obtain refills of the prescriptions they gave her.  She reports to urgent care today for injection treatment of her headache.  She reports does not keep down water but food is been hard to keep down.  Reports she was able to sleep last night but headache returned this morning.  Denies numbness, tingling, weakness.  Occasional blurry vision with her headaches.  Denies dizziness.  She endorses this is very typical for headaches and there were no major differences.  She took an Imitrex last night but is out.  She also states she is supposed to be having a refill of her nortriptyline.  Denies fever, chills, cough, runny nose and other flulike symptoms.  States she has received injections for headaches before.  Unsure of what she has received before.     Past Medical History:  Diagnosis Date  . Barrett esophagus   . Coronary artery disease   . GERD (gastroesophageal reflux disease)   . Headache    migraines 1-2/mo  . Hypertension   . Obesity (BMI 30-39.9)   . Postmenopausal bleeding 01/20/2019    Patient Active Problem List   Diagnosis Date Noted  . Migraine with aura and without status migrainosus, not intractable 04/06/2019  . Neuropathy 04/06/2019  . Cervical stenosis (uterine cervix) 03/31/2019  . Essential hypertension 04/22/2018  . Hypertriglyceridemia 04/22/2018  . History of chest pain 04/22/2018  . Tobacco abuse 04/22/2018    Past Surgical  History:  Procedure Laterality Date  . ATHERECTOMY Right    renal  . CARPAL TUNNEL RELEASE Left   . CARPAL TUNNEL RELEASE Right 09/27/2015   Procedure: OPEN CARPAL TUNNEL RELEASE RIGHT HAND;  Surgeon: Leanor Kail, MD;  Location: [facility];  Service: Orthopedics;  Laterality: Right;  . CESAREAN SECTION     x2  . COLONOSCOPY    . ESOPHAGOGASTRODUODENOSCOPY    . HYSTEROSCOPY WITH D & C N/A 04/18/2019   Procedure: DILATATION AND CURETTAGE /HYSTEROSCOPY;  Surgeon: Aletha Halim, MD;  Location: [facility];  Service: Gynecology;  Laterality: N/A;  . LAPAROSCOPIC SALPINGO OOPHERECTOMY Bilateral 04/18/2019   Procedure: LAPAROSCOPIC SALPINGO OOPHORECTOMY;  Surgeon: Aletha Halim, MD;  Location: [facility];  Service: Gynecology;  Laterality: Bilateral;  . TUBAL LIGATION      OB History    Gravida  2   Para  2   Term      Preterm      AB      Living  2     SAB      TAB      Ectopic      Multiple      Live Births  2            Home Medications    Prior to Admission medications   Medication Sig Start Date End Date Taking? Authorizing Provider  albuterol (VENTOLIN HFA) 108 (  90 Base) MCG/ACT inhaler Inhale into the lungs. 08/16/17   [provider]  Cholecalciferol (VITAMIN D3) 1.25 MG (50000 UT) CAPS Take 1 capsule by mouth once a week. 08/29/19   [provider]  esomeprazole (NEXIUM) 40 MG capsule Take 40 mg by mouth daily at 12 noon.    [provider]  hyoscyamine (LEVSIN) 0.125 MG tablet Take by mouth 3 (three) times daily as needed. 05/16/19   [provider]  lisinopril (ZESTRIL) 30 MG tablet Take 1 tablet (30 mg total) by mouth daily. 09/15/19   Troy Sine, MD  nortriptyline (PAMELOR) 10 MG capsule Take 10 mg by mouth at bedtime. 1 capsule by mouth nightly for one week then increase to 2 capsules nightly    [provider]  ondansetron (ZOFRAN) 4 MG tablet Take 1 tablet (4  mg total) by mouth every 8 (eight) hours as needed for nausea or vomiting. 12/12/19   Saber Dickerman, Marguerita Beards, PA-C  pregabalin (LYRICA) 50 MG capsule Take 50 mg by mouth 3 (three) times daily.    [provider]  SUMAtriptan (IMITREX) 25 MG tablet Take 25 mg by mouth every 2 (two) hours as needed for migraine. May repeat in 2 hours if headache persists or recurs.    [provider]    Family History Family History  Problem Relation Age of Onset  . Cancer Mother   . Hypertension Mother   . Cancer Father   . Hypertension Father   . Hypertension Sister   . Hypertension Brother   . Diabetes Paternal Grandmother   . Stroke Maternal Grandmother   . Epilepsy Son     Social History Social History   Tobacco Use  . Smoking status: Current Every Day Smoker    Packs/day: 0.50    Years: 30.00    Pack years: 15.00    Types: Cigarettes  . Smokeless tobacco: Never Used  Substance Use Topics  . Alcohol use: No  . Drug use: No     Allergies   Atorvastatin, Topamax [topiramate], and Vicodin [hydrocodone-acetaminophen]   Review of Systems Review of Systems   Physical Exam Triage Vital Signs ED Triage Vitals  Enc Vitals Group     BP 12/12/19 1314 138/83     Pulse Rate 12/12/19 1314 96     Resp 12/12/19 1314 16     Temp 12/12/19 1314 98.8 F (37.1 C)     Temp Source 12/12/19 1314 Oral     SpO2 12/12/19 1314 97 %     Weight 12/12/19 1315 150 lb (68 kg)     Height 12/12/19 1315 4\' 9"  (1.448 m)     Head Circumference --      Peak Flow --      Pain Score 12/12/19 1315 9     Pain Loc --      Pain Edu? --      Excl. in [place]? --    No data found.  Updated Vital Signs BP 138/83   Pulse 96   Temp 98.8 F (37.1 C) (Oral)   Resp 16   Ht 4\' 9"  (1.448 m)   Wt 150 lb (68 kg)   LMP 07/30/2017   SpO2 97%   BMI 32.46 kg/m   Visual Acuity Right Eye Distance:   Left Eye Distance:   Bilateral Distance:    Right Eye Near:   Left Eye Near:    Bilateral Near:      Physical Exam Vitals and nursing note  reviewed.  Constitutional:      General: She is not in acute distress.    Appearance: She is well-developed. She is not ill-appearing.  HENT:     Head: Normocephalic and atraumatic.  Eyes:     Extraocular Movements: Extraocular movements intact.     Conjunctiva/sclera: Conjunctivae normal.     Pupils: Pupils are equal, round, and reactive to light.  Cardiovascular:     Rate and Rhythm: Normal rate and regular rhythm.     Heart sounds: No murmur heard.   Pulmonary:     Effort: Pulmonary effort is normal. No respiratory distress.     Breath sounds: Normal breath sounds. No wheezing, rhonchi or rales.  Musculoskeletal:     Cervical back: Neck supple. No rigidity.  Skin:    General: Skin is warm and dry.  Neurological:     General: No focal deficit present.     Mental Status: She is alert and oriented to person, place, and time.     Cranial Nerves: No cranial nerve deficit.     Sensory: No sensory deficit.     Motor: No weakness.     Coordination: Coordination normal.     Gait: Gait normal.     Comments: Speech fluid.      UC Treatments / Results  Labs (all labs ordered are listed, but only abnormal results are displayed) Labs Reviewed - No data to display  EKG   Radiology No results found.  Procedures Procedures (including critical care time)  Medications Ordered in UC Medications  ketorolac (TORADOL) 30 MG/ML injection 30 mg (30 mg Intramuscular Given 12/12/19 1402)  metoCLOPramide (REGLAN) injection 5 mg (5 mg Intramuscular Given 12/12/19 1402)  dexamethasone (DECADRON) injection 10 mg (10 mg Intramuscular Given 12/12/19 1402)  SUMAtriptan (IMITREX) injection 6 mg (6 mg Subcutaneous Given 12/12/19 1401)    Initial Impression / Assessment and Plan / UC Course  I have reviewed the triage vital signs and the nursing notes.  Pertinent labs & imaging results that were available during my care of the patient were reviewed by  me and considered in my medical decision making (see chart for details).     #Migraine Patient is a 54 year old with history of migraines presenting with migraine headache.  Patient very adamant that this is consistent with her migraines.  No focal neurologic deficits here.  Will give headache cocktail here, patient reports improvement for about 20 minutes of receiving headache cocktail.  Patient was brought here by sister, so no concerns for driving after medications.  Will send out with short course of Zofran for continued vomiting.  Instructed her to follow-up with her neurologist for further management.  Discussed emergency department precautions.  Patient verbalized understanding agreement plan of care. Final Clinical Impressions(s) / UC Diagnoses   Final diagnoses:  Other migraine without status migrainosus, not intractable     Discharge Instructions     Use zofran only if unable to keep liquids down  Call your neurologist for further management of migraines  If sudden changes, severely worsening headache, other major changes in headache go to the Emergency department    ED Prescriptions    Medication Sig Dispense Auth. Provider   ondansetron (ZOFRAN) 4 MG tablet Take 1 tablet (4 mg total) by mouth every 8 (eight) hours as needed for nausea or vomiting. 4 tablet Lamaj Metoyer, Marguerita Beards, PA-C     PDMP not reviewed this encounter.   Purnell Shoemaker, PA-C 12/13/19 [phone]

## 2019-12-15 ENCOUNTER — Other Ambulatory Visit: Payer: Self-pay | Admitting: Cardiovascular Disease

## 2020-03-04 ENCOUNTER — Ambulatory Visit: Payer: Managed Care, Other (non HMO) | Attending: Neurology

## 2020-03-04 ENCOUNTER — Other Ambulatory Visit: Payer: Self-pay

## 2020-03-04 DIAGNOSIS — M6281 Muscle weakness (generalized): Secondary | ICD-10-CM | POA: Diagnosis present

## 2020-03-04 DIAGNOSIS — M5416 Radiculopathy, lumbar region: Secondary | ICD-10-CM | POA: Diagnosis present

## 2020-03-04 DIAGNOSIS — M545 Low back pain, unspecified: Secondary | ICD-10-CM | POA: Insufficient documentation

## 2020-03-04 DIAGNOSIS — G8929 Other chronic pain: Secondary | ICD-10-CM | POA: Diagnosis present

## 2020-03-04 NOTE — Therapy (Signed)
[facility] [address] [place], [address] Phone: [phone]   Fax:  [phone]  Physical Therapy Evaluation  Patient Details  Name: Robyn Anderson MRN: [id number] Date of Birth: [birth year] Referring Provider (PT): Gurney Maxin, MD   Encounter Date: 03/04/2020   PT End of Session - 03/04/20 1030    Visit Number 1    Number of Visits 17    Date for PT Re-Evaluation 05/02/20    PT Start Time 1030    PT Stop Time 1117    PT Time Calculation (min) 47 min    Activity Tolerance Patient tolerated treatment well    Behavior During Therapy [facility] for tasks assessed/performed           Past Medical History:  Diagnosis Date  . Barrett esophagus   . Coronary artery disease   . GERD (gastroesophageal reflux disease)   . Headache    migraines 1-2/mo  . Hypertension   . Obesity (BMI 30-39.9)   . Postmenopausal bleeding 01/20/2019    Past Surgical History:  Procedure Laterality Date  . ATHERECTOMY Right    renal  . CARPAL TUNNEL RELEASE Left   . CARPAL TUNNEL RELEASE Right 09/27/2015   Procedure: OPEN CARPAL TUNNEL RELEASE RIGHT HAND;  Surgeon: Leanor Kail, MD;  Location: [facility];  Service: Orthopedics;  Laterality: Right;  . CESAREAN SECTION     x2  . COLONOSCOPY    . ESOPHAGOGASTRODUODENOSCOPY    . HYSTEROSCOPY WITH D & C N/A 04/18/2019   Procedure: DILATATION AND CURETTAGE /HYSTEROSCOPY;  Surgeon: Aletha Halim, MD;  Location: [facility];  Service: Gynecology;  Laterality: N/A;  . LAPAROSCOPIC SALPINGO OOPHERECTOMY Bilateral 04/18/2019   Procedure: LAPAROSCOPIC SALPINGO OOPHORECTOMY;  Surgeon: Aletha Halim, MD;  Location: [facility];  Service: Gynecology;  Laterality: Bilateral;  . TUBAL LIGATION      There were no vitals filed for this visit.    Subjective Assessment - 03/04/20 1034    Subjective Low back pain, mainly on R side. 0/10 currently  (pt sitting on chair), 10/10 at most for the past 3 months (sometimes can't hardly walk);  RLE: 0/10 currently (pt took advil this morning), 10/10 at most for the past 3 months.    Pertinent History Back pain with B LE symptoms R > L around R L5 dermatome. Back has bothered her for about a year. Always had pain in her legs but worsened over a year ago. Felt B anterior thigh burning. sometimes wakes up in the middle of the night with R L5 dermatome pain.  Lyrica and gabapentin do not help. Advil gives her a little of relief.    Diagnostic tests MRI spine November 2021, pt was told to have stenosis in low back as well as arthritis.    Patient Stated Goals Decrease pain, figure out what she can do to help her back and LE feel better.    Currently in Pain? No/denies    Pain Score 0-No pain    Pain Location Back    Pain Orientation Right;Left;Lower;Lateral    Pain Descriptors / Indicators Sharp    Pain Type Chronic pain    Pain Radiating Towards R L5 to her R foot.    Pain Onset More than a month ago    Pain Frequency Occasional    Aggravating Factors  Sitting for a long time (sits in front of a computer at night), about 2-3 hours, standing  for too long, about 1 hour,    Pain Relieving Factors Advil, Laying on her side L > R ocassionally, walking out her pain for about 45 min to an hour.              [facility] PT Assessment - 03/04/20 1033      Assessment   Medical Diagnosis Back pain    Referring Provider (PT) Gurney Maxin, MD    Onset Date/Surgical Date 01/29/20      Precautions   Precaution Comments No known precautions      Restrictions   Other Position/Activity Restrictions No known restrictions      Balance Screen   Has the patient fallen in the past 6 months No    Has the patient had a decrease in activity level because of a fear of falling?  No    Is the patient reluctant to leave their home because of a fear of falling?  No      Home Environment   Additional Comments Pt  lives in a trailer with boyfriend and 3 kids, 3 steps to enter bilateral rail       Prior Function   Level of Independence Independent      Observation/Other Assessments   Focus on Therapeutic Outcomes (FOTO)  Lumbar FOTO: 46      Posture/Postural Control   Posture Comments Forward neck, B progracted shoulders, L > R, L shoulder lower, L lateral shift, slight kyphosis, movement crease around L1/L2 area.       AROM   Lumbar Flexion Limited wiht B sciatic pulling    Lumbar Extension WLF with slight symptoms    Lumbar - Right Side Bend WFL    Lumbar - Left Side Bend WFL with slight R thoracolumbar discomfort    Lumbar - Right Rotation WFL with reproudction of symptoms with overpressure    Lumbar - Left Rotation Limited with L thoracolumbar symptoms.       PROM   Right Hip Internal Rotation  27    Left Hip Internal Rotation  18      Strength   Right Hip Flexion 4/5    Right Hip Extension 4+/5   seated manually resisted   Right Hip ABduction 4-/5    Left Hip Flexion 4/5    Left Hip Extension 4/5   seated manually resisted   Left Hip ABduction 4/5    Right Knee Flexion 4+/5    Right Knee Extension 4+/5    Left Knee Flexion 4+/5    Left Knee Extension 5/5      Special Tests   Other special tests (+) Long sit test suggesting anterior nutation R innominate.       Ambulation/Gait   Gait Comments Decreased trunk rotation, L pelvic drop during R LE stance phase                      Objective measurements completed on examination: See above findings.   No latex band allergies  Blood pressure is controlled per pt.        Medbridge Access Code [email or web address]    Therapeutic exercise  seated hip IR stretch  L 30 seconds x 3  R 30 seconds x 3  Seated hip IR   L 10x3   Improved exercise technique, movement at target joints, use of target muscles after mod verbal, visual, tactile cues.    Response to treatment Pt tolerated session well without aggravation of  symptoms. No low back  or R LE pain reported during and after session.    Clinical impression Pt is a 54 year old female who comes to physical therapy secondary to low back pain with LE radiating symptoms R > L. She also presents with altered gait pattern and posture, limited B hip IR rotation, bilateral hip weakness, reproduction of symptoms with lumbar AROM, positive special test suggesting lumbopelvic involvement, and difficulty tolerating positions such as sitting for long periods as well as performing standing tasks secondary to back and LE pain. Pt will benefit from skilled physical therapy services to address the aforementioned deficits.             PT Education - 03/04/20 1127    Education Details ther-ex, HEP, plan of care    Person(s) Educated Patient    Methods Explanation;Demonstration;Tactile cues;Verbal cues;Handout    Comprehension Verbalized understanding;Returned demonstration            PT Short Term Goals - 03/04/20 1132      PT SHORT TERM GOAL #1   Title Pt will be indeoendent with her initial HEP to decrease pain, improve strength and ability to perform standing tasks more comfortably.    Baseline Pt has started her HEP (03/04/2020)    Time 3    Period Weeks    Status New    Target Date 03/28/20             PT Long Term Goals - 03/04/20 1133      PT LONG TERM GOAL #1   Title Patient will have a decrease in low back pain to 5/10 or less at worst to improve sitting and standing tolerance as well as improve ability to perform standing tasks more comfortably.    Baseline 10/10 low back pain at most for the past 3 months. (03/04/2020)    Time 8    Period Weeks    Status New    Target Date 05/02/20      PT LONG TERM GOAL #2   Title Pt will have a decrease in R LE pain to 5/10 or less at worst to promote ability to perform standing tasks more comfortably, as well as improve sitting and standing tolerance.    Baseline 10/10 R LE pain at most for the  past 3 months (03/04/2020)    Time 8    Period Weeks    Status New    Target Date 05/02/20      PT LONG TERM GOAL #3   Title Pt will improve bilateral hip strength by at least 1/2 MMT grade to promote ability to perform standing tasks more comfortably.    Time 8    Period Weeks    Status New    Target Date 05/02/20      PT LONG TERM GOAL #4   Title Pt will improve her lumbar FOTO score by at least 10 points as a demonstration of improved function.    Time 8    Period Weeks    Status New    Target Date 05/02/20                  Plan - 03/04/20 1127    Clinical Impression Statement Pt is a 54 year old female who comes to physical therapy secondary to low back pain with LE radiating symptoms R > L. She also presents with altered gait pattern and posture, limited B hip IR rotation, bilateral hip weakness, reproduction of symptoms with lumbar AROM, positive special test  suggesting lumbopelvic involvement, and difficulty tolerating positions such as sitting for long periods as well as performing standing tasks secondary to back and LE pain. Pt will benefit from skilled physical therapy services to address the aforementioned deficits.    Personal Factors and Comorbidities Age;Comorbidity 3+;Fitness;Past/Current Experience;Time since onset of injury/illness/exacerbation    Comorbidities HTN, obesity, hx of C-section    Examination-Activity Limitations Stand;Sit;Sleep    Stability/Clinical Decision Making Evolving/Moderate complexity   pain seems to be overall worsening since a year ago based on subjective   Clinical Decision Making Moderate    Rehab Potential Fair    PT Frequency 2x / week    PT Duration 8 weeks    PT Treatment/Interventions Therapeutic activities;Therapeutic exercise;Neuromuscular re-education;Patient/family education;Manual techniques;Dry needling;Spinal Manipulations;Joint Manipulations;Aquatic Therapy;Electrical Stimulation;Iontophoresis 4mg /ml  Dexamethasone;Traction    PT Next Visit Plan posture, thoracic extension, trunk, hip strengthening, lumbpopelvic ccontrol, manual techniques, modalities PRN    PT Home Exercise Plan Medbridge Access Code [email or web address]    Consulted and Agree with Plan of Care Patient           Patient will benefit from skilled therapeutic intervention in order to improve the following deficits and impairments:  Pain, Improper body mechanics, Postural dysfunction, Decreased strength, Decreased range of motion  Visit Diagnosis: Chronic bilateral low back pain, unspecified whether sciatica present - Plan: PT plan of care cert/re-cert  Radiculopathy, lumbar region - Plan: PT plan of care cert/re-cert  Muscle weakness (generalized) - Plan: PT plan of care cert/re-cert     Problem List Patient Active Problem List   Diagnosis Date Noted  . Migraine with aura and without status migrainosus, not intractable 04/06/2019  . Neuropathy 04/06/2019  . Cervical stenosis (uterine cervix) 03/31/2019  . Essential hypertension 04/22/2018  . Hypertriglyceridemia 04/22/2018  . History of chest pain 04/22/2018  . Tobacco abuse 04/22/2018    Joneen Boers PT, DPT   03/04/2020, 1:57 PM  [facility] [address] [place], [address] Phone: [phone]   Fax:  [phone]  Name: LASHAI GROSCH MRN: [id number] Date of Birth: Aug 20, 1965

## 2020-03-04 NOTE — Patient Instructions (Signed)
Access Code: [email or web address] URL: [email or web address] Date: 03/04/2020 Prepared by: Joneen Boers  Exercises Seated Piriformis Stretch - 3 x daily - 7 x weekly - 3 sets - 3 reps - 30 seconds hold Seated Hip Internal Rotation AROM - 3 x daily - 7 x weekly - 3 sets - 10 reps

## 2020-03-06 ENCOUNTER — Ambulatory Visit: Payer: Managed Care, Other (non HMO)

## 2020-03-06 ENCOUNTER — Other Ambulatory Visit: Payer: Self-pay

## 2020-03-06 DIAGNOSIS — M6281 Muscle weakness (generalized): Secondary | ICD-10-CM

## 2020-03-06 DIAGNOSIS — M545 Low back pain, unspecified: Secondary | ICD-10-CM | POA: Diagnosis not present

## 2020-03-06 DIAGNOSIS — M5416 Radiculopathy, lumbar region: Secondary | ICD-10-CM

## 2020-03-06 NOTE — Patient Instructions (Addendum)
Seated hip extension isometrics   Sitting on a chair,    Squeeze your rear end muscles together and press your right foot onto the floor.     Hold for 5 seconds    Repeat 10 times   Perform 3 sets daily.      This is a corrective exercise. Once you no longer have symptoms, you can stop.      Seated Left shoulder extension isometrics  Sitting on a chair   Gently press your left hand on your left thigh to feel your trunk muscles activate.    Hold for 5 seconds.    Repeat 10 times.   Perform 3 sets daily.     This is a corrective exercise. Once you no longer have symptoms, you can stop.    Standing L lateral shift correction at wall 10x2 with 5 second holds   Decreased low back pain to 2-3/10   Reviewed and given as part of her HEP (10x3 with 5 second holds daily). Pt demonstrated and verbalized understanding. Handout provided.      Access Code: [email or web address] URL: [email or web address] Date: 03/06/2020 Prepared by: Joneen Boers  Exercises Seated Piriformis Stretch - 3 x daily - 7 x weekly - 3 sets - 3 reps - 30 seconds hold Seated Hip Internal Rotation AROM - 3 x daily - 7 x weekly - 3 sets - 10 reps Seated Transversus Abdominis Bracing - 5 x daily - 7 x weekly - 3 sets - 10 reps - 5 seconds hold Seated Gluteal Sets - 5 x daily - 7 x weekly - 3 sets - 10 reps - 5 seconds hold Seated Scapular Retraction - 5 x daily - 7 x weekly - 3 sets - 10 reps - 5 seconds hold

## 2020-03-06 NOTE — Therapy (Signed)
[facility] [address], [place], [address] Phone: [phone]   Fax:  [phone]  Physical Therapy Treatment  Patient Details  Name: Robyn Anderson MRN: [id number] Date of Birth: 1965-08-10 Referring Provider (PT): Gurney Maxin, MD   Encounter Date: 03/06/2020   PT End of Session - 03/06/20 0802    Visit Number 2    Number of Visits 17    Date for PT Re-Evaluation 05/02/20    PT Start Time 0803    PT Stop Time 0848    PT Time Calculation (min) 45 min    Activity Tolerance Patient tolerated treatment well    Behavior During Therapy [facility] for tasks assessed/performed           Past Medical History:  Diagnosis Date  . Barrett esophagus   . Coronary artery disease   . GERD (gastroesophageal reflux disease)   . Headache    migraines 1-2/mo  . Hypertension   . Obesity (BMI 30-39.9)   . Postmenopausal bleeding 01/20/2019    Past Surgical History:  Procedure Laterality Date  . ATHERECTOMY Right    renal  . CARPAL TUNNEL RELEASE Left   . CARPAL TUNNEL RELEASE Right 09/27/2015   Procedure: OPEN CARPAL TUNNEL RELEASE RIGHT HAND;  Surgeon: Leanor Kail, MD;  Location: [place];  Service: Orthopedics;  Laterality: Right;  . CESAREAN SECTION     x2  . COLONOSCOPY    . ESOPHAGOGASTRODUODENOSCOPY    . HYSTEROSCOPY WITH D & C N/A 04/18/2019   Procedure: DILATATION AND CURETTAGE /HYSTEROSCOPY;  Surgeon: Aletha Halim, MD;  Location: [place];  Service: Gynecology;  Laterality: N/A;  . LAPAROSCOPIC SALPINGO OOPHERECTOMY Bilateral 04/18/2019   Procedure: LAPAROSCOPIC SALPINGO OOPHORECTOMY;  Surgeon: Aletha Halim, MD;  Location: [employer];  Service: Gynecology;  Laterality: Bilateral;  . TUBAL LIGATION      There were no vitals filed for this visit.   Subjective Assessment - 03/06/20 0804    Subjective Back and LE did not do too good last night at  work, works third shift, worked 11 pm to 7:30 am. Pt works as a Gaffer at [employer]. 5-6/10 low back currently. No LE symptoms currently.    Pertinent History Back pain with B LE symptoms R > L around R L5 dermatome. Back has bothered her for about a year. Always had pain in her legs but worsened over a year ago. Felt B anterior thigh burning. sometimes wakes up in the middle of the night with R L5 dermatome pain.  Lyrica and gabapentin do not help. Advil gives her a little of relief.    Diagnostic tests MRI spine November 2021, pt was told to have stenosis in low back as well as arthritis.    Patient Stated Goals Decrease pain, figure out what she can do to help her back and LE feel better.    Currently in Pain? Yes    Pain Score 6     Pain Location Back    Pain Onset More than a month ago                                     PT Education - 03/06/20 0824    Education Details ther-ex, HEP    Person(s) Educated Patient    Methods Explanation;Demonstration;Tactile cues;Verbal cues;Handout    Comprehension Verbalized understanding;Returned  demonstration            Objective  No latex band allergies  Blood pressure is controlled per pt.        Medbridge Access Code [email or web address]    Therapeutic exercise Seated R hip extension isometrics 10x3 with 5 second holds   Improved low back posture observed.   Decreased back pain reported   Seated manually resisted upper trunk rotation isometrics in neutral   L 10x2 with 5 second holds (to promote R lumbar rotation and more neutral posture).    Palpation: L lumbar paraspinal muscle tension > R.   Seated B shoulder extension isometrics, hands on thighs 10x wiith 5 second holds   Then L shoulder only 10x3 with 5 second holds. Decreased L lumbar paraspinal muscle tension and decreased L lumbar rotation and more neutral low back posture observed. Decreased low back pain reported.    Seated  transversus abdominis contraction with glute max squeeze 10x5 seconds for 3 sets  Seated B scapular retraction to promote thoracic extension and decrease low back extension stress  10x5 seconds for 3 sets  Standing L lateral shift correction at wall 10x2 with 5 second holds   Decreased low back pain to 2-3/10   Reviewed and given as part of her HEP (10x3 with 5 second holds daily). Pt demonstrated and verbalized understanding. Handout provided.    Improved exercise technique, movement at target joints, use of target muscles after mod verbal, visual, tactile cues.    Response to treatment Pt tolerated session well without aggravation of symptoms.     Clinical impression Worked on decreasing L lateral shift, improving trunk and R glute max strength to improve posture and decrease stress to low back. Decreased low back pain to 2-3/10 at end of session. Pt will benefit from continued skilled physical therapy services to decrease pain, improve posture, strength, and function.         PT Short Term Goals - 03/04/20 1132      PT SHORT TERM GOAL #1   Title Pt will be indeoendent with her initial HEP to decrease pain, improve strength and ability to perform standing tasks more comfortably.    Baseline Pt has started her HEP (03/04/2020)    Time 3    Period Weeks    Status New    Target Date 03/28/20             PT Long Term Goals - 03/04/20 1133      PT LONG TERM GOAL #1   Title Patient will have a decrease in low back pain to 5/10 or less at worst to improve sitting and standing tolerance as well as improve ability to perform standing tasks more comfortably.    Baseline 10/10 low back pain at most for the past 3 months. (03/04/2020)    Time 8    Period Weeks    Status New    Target Date 05/02/20      PT LONG TERM GOAL #2   Title Pt will have a decrease in R LE pain to 5/10 or less at worst to promote ability to perform standing tasks more comfortably, as well as improve  sitting and standing tolerance.    Baseline 10/10 R LE pain at most for the past 3 months (03/04/2020)    Time 8    Period Weeks    Status New    Target Date 05/02/20      PT LONG TERM GOAL #3   Title  Pt will improve bilateral hip strength by at least 1/2 MMT grade to promote ability to perform standing tasks more comfortably.    Time 8    Period Weeks    Status New    Target Date 05/02/20      PT LONG TERM GOAL #4   Title Pt will improve her lumbar FOTO score by at least 10 points as a demonstration of improved function.    Time 8    Period Weeks    Status New    Target Date 05/02/20                 Plan - 03/06/20 0825    Clinical Impression Statement Worked on decreasing L lateral shift, improving trunk and R glute max strength to improve posture and decrease stress to low back. Decreased low back pain to 2-3/10 at end of session. Pt will benefit from continued skilled physical therapy services to decrease pain, improve posture, strength, and function.    Personal Factors and Comorbidities Age;Comorbidity 3+;Fitness;Past/Current Experience;Time since onset of injury/illness/exacerbation    Comorbidities HTN, obesity, hx of C-section    Examination-Activity Limitations Stand;Sit;Sleep    Stability/Clinical Decision Making Evolving/Moderate complexity   pain seems to be overall worsening since a year ago based on subjective   Rehab Potential Fair    PT Frequency 2x / week    PT Duration 8 weeks    PT Treatment/Interventions Therapeutic activities;Therapeutic exercise;Neuromuscular re-education;Patient/family education;Manual techniques;Dry needling;Spinal Manipulations;Joint Manipulations;Aquatic Therapy;Electrical Stimulation;Iontophoresis 4mg /ml Dexamethasone;Traction    PT Next Visit Plan posture, thoracic extension, trunk, hip strengthening, lumbpopelvic ccontrol, manual techniques, modalities PRN    PT Home Exercise Plan Medbridge Access Code [email or web address]    Consulted and  Agree with Plan of Care Patient           Patient will benefit from skilled therapeutic intervention in order to improve the following deficits and impairments:  Pain, Improper body mechanics, Postural dysfunction, Decreased strength, Decreased range of motion  Visit Diagnosis: Chronic bilateral low back pain, unspecified whether sciatica present  Radiculopathy, lumbar region  Muscle weakness (generalized)     Problem List Patient Active Problem List   Diagnosis Date Noted  . Migraine with aura and without status migrainosus, not intractable 04/06/2019  . Neuropathy 04/06/2019  . Cervical stenosis (uterine cervix) 03/31/2019  . Essential hypertension 04/22/2018  . Hypertriglyceridemia 04/22/2018  . History of chest pain 04/22/2018  . Tobacco abuse 04/22/2018    Joneen Boers PT, DPT   03/06/2020, 11:49 AM  [place] [facility] [address], [place], [address] Phone: [phone]   Fax:  [phone]  Name: Robyn Anderson MRN: [id number] Date of Birth: 27-Apr-1965

## 2020-03-12 ENCOUNTER — Other Ambulatory Visit: Payer: Self-pay

## 2020-03-12 ENCOUNTER — Ambulatory Visit: Payer: Managed Care, Other (non HMO)

## 2020-03-12 DIAGNOSIS — M545 Low back pain, unspecified: Secondary | ICD-10-CM | POA: Diagnosis not present

## 2020-03-12 DIAGNOSIS — M5416 Radiculopathy, lumbar region: Secondary | ICD-10-CM

## 2020-03-12 DIAGNOSIS — G8929 Other chronic pain: Secondary | ICD-10-CM

## 2020-03-12 DIAGNOSIS — M6281 Muscle weakness (generalized): Secondary | ICD-10-CM

## 2020-03-12 NOTE — Therapy (Signed)
[facility] [address], [place], [address] Phone: [phone]   Fax:  [phone]  Physical Therapy Treatment  Patient Details  Name: Robyn Anderson  MRN: [id number] Date of Birth: [birth year] Referring Provider (PT): Gurney Maxin, MD   Encounter Date: 03/12/2020   PT End of Session - 03/12/20 0852    Visit Number 3    Number of Visits 17    Date for PT Re-Evaluation 05/02/20    PT Start Time 0851    PT Stop Time 0930    PT Time Calculation (min) 39 min    Activity Tolerance Patient tolerated treatment well    Behavior During Therapy [facility] for tasks assessed/performed           Past Medical History:  Diagnosis Date  . Barrett esophagus   . Coronary artery disease   . GERD (gastroesophageal reflux disease)   . Headache    migraines 1-2/mo  . Hypertension   . Obesity (BMI 30-39.9)   . Postmenopausal bleeding 01/20/2019    Past Surgical History:  Procedure Laterality Date  . ATHERECTOMY Right    renal  . CARPAL TUNNEL RELEASE Left   . CARPAL TUNNEL RELEASE Right 09/27/2015   Procedure: OPEN CARPAL TUNNEL RELEASE RIGHT HAND;  Surgeon: Leanor Kail, MD;  Location: [place];  Service: Orthopedics;  Laterality: Right;  . CESAREAN SECTION     x2  . COLONOSCOPY    . ESOPHAGOGASTRODUODENOSCOPY    . HYSTEROSCOPY WITH D & C N/A 04/18/2019   Procedure: DILATATION AND CURETTAGE /HYSTEROSCOPY;  Surgeon: Aletha Halim, MD;  Location: [place];  Service: Gynecology;  Laterality: N/A;  . LAPAROSCOPIC SALPINGO OOPHERECTOMY Bilateral 04/18/2019   Procedure: LAPAROSCOPIC SALPINGO OOPHORECTOMY;  Surgeon: Aletha Halim, MD;  Location: [place];  Service: Gynecology;  Laterality: Bilateral;  . TUBAL LIGATION      There were no vitals filed for this visit.   Subjective Assessment - 03/12/20 0852    Subjective Back is ok. No pain currently. Has a little ach R  LE (L5 dermatome). Did not work last night, off all week.  Thinks that the exercises are working.    Pertinent History Back pain with B LE symptoms R > L around R L5 dermatome. Back has bothered her for about a year. Always had pain in her legs but worsened over a year ago. Felt B anterior thigh burning. sometimes wakes up in the middle of the night with R L5 dermatome pain.  Lyrica and gabapentin do not help. Advil gives her a little of relief.    Diagnostic tests MRI spine November 2021, pt was told to have stenosis in low back as well as arthritis.    Patient Stated Goals Decrease pain, figure out what she can do to help her back and LE feel better.    Currently in Pain? Yes    Pain Score 1     Pain Orientation Right    Pain Descriptors / Indicators Aching    Pain Onset More than a month ago                                     PT Education - 03/12/20 0929    Education Details ther-ex, HEP    Person(s) Educated Patient    Methods Explanation;Demonstration;Tactile cues;Verbal cues    Comprehension Returned demonstration;Verbalized  understanding            Objective  No latex band allergies  Blood pressure is controlled per pt.       MedbridgeAccess Code [email or web address]   Therapeutic exercise Seated R hip extension isometrics 10x3 with 5 second holds              Improved low back posture observed.               Seated B shoulder extension isometrics, hands on thighs 10x wiith 5 second holds              Then L shoulder only 10x with 5 second holds.   Seated manually resisted L lumbar side bend isometrics to decrease R lumbar side bend posture  10x5 seconds    Then with R latearl shift isometrics upper trunk at the same time10x5 seconds    R anterior thigh discomfort   Seated R lateral shift isometrics upper trunk 10x5 seconds for 3 sets  R lateral thigh ache feels better.    Seated B scapular retraction 10x2 for 5 seconds to promote  thoracic extension and decrease lumbar extension stress  Seated chin tucks 10x 5 seconds to promote thoracic extension.     Improved exercise technique, movement at target joints, use of target muscles after mod verbal, visual, tactile cues.    Response to treatment Pt tolerated session well without aggravation of symptoms.     Clinical impression Decreased R lateral thigh ache and and improved posture with R lateral shift isometrics in neutral. Continued working on trunk, scapular, glute strengthening as well as thoracic extension to help decrease stress to low back. Pt will benefit from continued skilled physical therapy services to decrease pain, improve strength and function.    PT Short Term Goals - 03/04/20 1132      PT SHORT TERM GOAL #1   Title Pt will be indeoendent with her initial HEP to decrease pain, improve strength and ability to perform standing tasks more comfortably.    Baseline Pt has started her HEP (03/04/2020)    Time 3    Period Weeks    Status New    Target Date 03/28/20             PT Long Term Goals - 03/04/20 1133      PT LONG TERM GOAL #1   Title Patient will have a decrease in low back pain to 5/10 or less at worst to improve sitting and standing tolerance as well as improve ability to perform standing tasks more comfortably.    Baseline 10/10 low back pain at most for the past 3 months. (03/04/2020)    Time 8    Period Weeks    Status New    Target Date 05/02/20      PT LONG TERM GOAL #2   Title Pt will have a decrease in R LE pain to 5/10 or less at worst to promote ability to perform standing tasks more comfortably, as well as improve sitting and standing tolerance.    Baseline 10/10 R LE pain at most for the past 3 months (03/04/2020)    Time 8    Period Weeks    Status New    Target Date 05/02/20      PT LONG TERM GOAL #3   Title Pt will improve bilateral hip strength by at least 1/2 MMT grade to promote ability to perform  standing tasks more comfortably.    Time  8    Period Weeks    Status New    Target Date 05/02/20      PT LONG TERM GOAL #4   Title Pt will improve her lumbar FOTO score by at least 10 points as a demonstration of improved function.    Time 8    Period Weeks    Status New    Target Date 05/02/20                 Plan - 03/12/20 0930    Clinical Impression Statement Decreased R lateral thigh ache and and improved posture with R lateral shift isometrics in neutral. Continued working on trunk, scapular, glute strengthening as well as thoracic extension to help decrease stress to low back. Pt will benefit from continued skilled physical therapy services to decrease pain, improve strength and function.    Personal Factors and Comorbidities Age;Comorbidity 3+;Fitness;Past/Current Experience;Time since onset of injury/illness/exacerbation    Comorbidities HTN, obesity, hx of C-section    Examination-Activity Limitations Stand;Sit;Sleep    Stability/Clinical Decision Making Stable/Uncomplicated   pain seems to be overall worsening since a year ago based on subjective   Clinical Decision Making Low    Rehab Potential Fair    PT Frequency 2x / week    PT Duration 8 weeks    PT Treatment/Interventions Therapeutic activities;Therapeutic exercise;Neuromuscular re-education;Patient/family education;Manual techniques;Dry needling;Spinal Manipulations;Joint Manipulations;Aquatic Therapy;Electrical Stimulation;Iontophoresis 4mg /ml Dexamethasone;Traction    PT Next Visit Plan posture, thoracic extension, trunk, hip strengthening, lumbpopelvic ccontrol, manual techniques, modalities PRN    PT Home Exercise Plan Medbridge Access Code [email or web address]    Consulted and Agree with Plan of Care Patient           Patient will benefit from skilled therapeutic intervention in order to improve the following deficits and impairments:  Pain, Improper body mechanics, Postural dysfunction, Decreased strength,  Decreased range of motion  Visit Diagnosis: Chronic bilateral low back pain, unspecified whether sciatica present  Radiculopathy, lumbar region  Muscle weakness (generalized)     Problem List Patient Active Problem List   Diagnosis Date Noted  . Migraine with aura and without status migrainosus, not intractable 04/06/2019  . Neuropathy 04/06/2019  . Cervical stenosis (uterine cervix) 03/31/2019  . Essential hypertension 04/22/2018  . Hypertriglyceridemia 04/22/2018  . History of chest pain 04/22/2018  . Tobacco abuse 04/22/2018    Joneen Boers PT, DPT   03/12/2020, 1:24 PM  [place] [facility] [address] [place], [address] Phone: [phone]   Fax:  [phone]  Name: Robyn Anderson MRN: [id number] Date of Birth: [birth year]

## 2020-03-18 ENCOUNTER — Ambulatory Visit: Payer: Managed Care, Other (non HMO)

## 2020-03-20 ENCOUNTER — Other Ambulatory Visit: Payer: Self-pay

## 2020-03-20 ENCOUNTER — Ambulatory Visit: Payer: Managed Care, Other (non HMO) | Attending: Neurology

## 2020-03-20 DIAGNOSIS — G8929 Other chronic pain: Secondary | ICD-10-CM | POA: Diagnosis present

## 2020-03-20 DIAGNOSIS — M545 Low back pain, unspecified: Secondary | ICD-10-CM | POA: Diagnosis present

## 2020-03-20 DIAGNOSIS — M5416 Radiculopathy, lumbar region: Secondary | ICD-10-CM | POA: Insufficient documentation

## 2020-03-20 DIAGNOSIS — M6281 Muscle weakness (generalized): Secondary | ICD-10-CM | POA: Diagnosis present

## 2020-03-20 NOTE — Therapy (Signed)
[facility] [address] [place], [address] Phone: [phone]   Fax:  [phone]  Physical Therapy Treatment  Patient Details  Name: Robyn Anderson MRN: [id number] Date of Birth: 1965/05/27 Referring Provider (PT): Gurney Maxin, MD   Encounter Date: 03/20/2020   PT End of Session - 03/20/20 1116    Visit Number 4    Number of Visits 17    Date for PT Re-Evaluation 05/02/20    PT Start Time 1324    PT Stop Time 1203    PT Time Calculation (min) 47 min    Activity Tolerance Patient tolerated treatment well    Behavior During Therapy [facility] for tasks assessed/performed           Past Medical History:  Diagnosis Date  . Barrett esophagus   . Coronary artery disease   . GERD (gastroesophageal reflux disease)   . Headache    migraines 1-2/mo  . Hypertension   . Obesity (BMI 30-39.9)   . Postmenopausal bleeding 01/20/2019    Past Surgical History:  Procedure Laterality Date  . ATHERECTOMY Right    renal  . CARPAL TUNNEL RELEASE Left   . CARPAL TUNNEL RELEASE Right 09/27/2015   Procedure: OPEN CARPAL TUNNEL RELEASE RIGHT HAND;  Surgeon: Leanor Kail, MD;  Location: [place];  Service: Orthopedics;  Laterality: Right;  . CESAREAN SECTION     x2  . COLONOSCOPY    . ESOPHAGOGASTRODUODENOSCOPY    . HYSTEROSCOPY WITH D & C N/A 04/18/2019   Procedure: DILATATION AND CURETTAGE /HYSTEROSCOPY;  Surgeon: Aletha Halim, MD;  Location: [place];  Service: Gynecology;  Laterality: N/A;  . LAPAROSCOPIC SALPINGO OOPHERECTOMY Bilateral 04/18/2019   Procedure: LAPAROSCOPIC SALPINGO OOPHORECTOMY;  Surgeon: Aletha Halim, MD;  Location: [place];  Service: Gynecology;  Laterality: Bilateral;  . TUBAL LIGATION      There were no vitals filed for this visit.   Subjective Assessment - 03/20/20 1117    Subjective Back is ok. No pain right now. Everytime she goes  to sleep last week and this week her R LE has been bothering her. R LE bothered her last night.  R LE is not bothering her currently. 6-7/10 low back pain, 9/10 R LE pain at most for the past 7days. R LE bothers her the most when sleeping. Pt lays on L side.    Pertinent History Back pain with B LE symptoms R > L around R L5 dermatome. Back has bothered her for about a year. Always had pain in her legs but worsened over a year ago. Felt B anterior thigh burning. sometimes wakes up in the middle of the night with R L5 dermatome pain.  Lyrica and gabapentin do not help. Advil gives her a little of relief.    Diagnostic tests MRI spine November 2021, pt was told to have stenosis in low back as well as arthritis.    Patient Stated Goals Decrease pain, figure out what she can do to help her back and LE feel better.    Currently in Pain? No/denies    Pain Onset More than a month ago                                     PT Education - 03/20/20 1222    Education Details ther-ex, HEP    Person(s) Educated  Patient    Methods Explanation;Demonstration;Tactile cues;Verbal cues;Handout    Comprehension Verbalized understanding;Returned demonstration          Objective  No latex band allergies  Blood pressure is controlled per pt.       MedbridgeAccess Code [email or web address]   Therapeutic exercise Standing trunk flexion 10x. B sciatic discomfort   Standing side bend   L 10x: R paraspinal muscle discomfort   R 10x: R paraspinal muscle discomfort  Standing trunk flexion  To the L L posterior knee discomfort  To the R: R lumbar paraspinal muscle tension  Standing trunk extension   To the R: R lumbar paraspinal discomfort  To the L: R lumbar paraspinal discomfort  Standing back extension: feels ok  10x5 seconds for 3 sets   Standing R lateral shift correction 10x5 seconds for 3 sets  No low back pain during exercise reported  Reviewed and given as part of  her HEP. Pt demonstrated and verbalized understanding. Handout provided. Stop exercise if symptoms increase. Pt verbalized understanding.    Prone on elbows with B hip IR position with deep breaths 2 minutes   Prone glute and quad set 10x2 with 5 seconds each LE  Bridge 10x3  Standing B scapular retraction green band 10x5 seconds for 2 sets  Static mini lunge L LE 10x2  Slight R proximal lateral hip discomfort  Standing back extension again 10x5 seconds   Decreased R proximal lateral hip discomfort  Improved exercise technique, movement at target joints, use of target muscles after mod verbal, visual, tactile cues.    Response to treatment Good tolerance to today's session.     Clinical impression Pt demonstrates extension preference based on lumbar AROM today. Worked on extension based exercises. Also worked on R lateral shift correction secondary to reproduction of R LE symptoms with L lateral shift correction exercise today and no symptoms with R lateral shift correction. Good tolerance to today's session. Pt will benefit from continued skilled physical therapy services to decrease pain, improve strength and function.           PT Short Term Goals - 03/04/20 1132      PT SHORT TERM GOAL #1   Title Pt will be indeoendent with her initial HEP to decrease pain, improve strength and ability to perform standing tasks more comfortably.    Baseline Pt has started her HEP (03/04/2020)    Time 3    Period Weeks    Status New    Target Date 03/28/20             PT Long Term Goals - 03/04/20 1133      PT LONG TERM GOAL #1   Title Patient will have a decrease in low back pain to 5/10 or less at worst to improve sitting and standing tolerance as well as improve ability to perform standing tasks more comfortably.    Baseline 10/10 low back pain at most for the past 3 months. (03/04/2020)    Time 8    Period Weeks    Status New    Target Date 05/02/20      PT LONG  TERM GOAL #2   Title Pt will have a decrease in R LE pain to 5/10 or less at worst to promote ability to perform standing tasks more comfortably, as well as improve sitting and standing tolerance.    Baseline 10/10 R LE pain at most for the past 3 months (03/04/2020)    Time 8  Period Weeks    Status New    Target Date 05/02/20      PT LONG TERM GOAL #3   Title Pt will improve bilateral hip strength by at least 1/2 MMT grade to promote ability to perform standing tasks more comfortably.    Time 8    Period Weeks    Status New    Target Date 05/02/20      PT LONG TERM GOAL #4   Title Pt will improve her lumbar FOTO score by at least 10 points as a demonstration of improved function.    Time 8    Period Weeks    Status New    Target Date 05/02/20                 Plan - 03/20/20 1112    Clinical Impression Statement Pt demonstrates extension preference based on lumbar AROM today. Worked on extension based exercises. Also worked on R lateral shift correction secondary to reproduction of R LE symptoms with L lateral shift correction exercise today and no symptoms with R lateral shift correction. Good tolerance to today's session. Pt will benefit from continued skilled physical therapy services to decrease pain, improve strength and function.    Personal Factors and Comorbidities Age;Comorbidity 3+;Fitness;Past/Current Experience;Time since onset of injury/illness/exacerbation    Comorbidities HTN, obesity, hx of C-section    Examination-Activity Limitations Stand;Sit;Sleep    Stability/Clinical Decision Making Stable/Uncomplicated   pain seems to be overall worsening since a year ago based on subjective   Rehab Potential Fair    PT Frequency 2x / week    PT Duration 8 weeks    PT Treatment/Interventions Therapeutic activities;Therapeutic exercise;Neuromuscular re-education;Patient/family education;Manual techniques;Dry needling;Spinal Manipulations;Joint Manipulations;Aquatic  Therapy;Electrical Stimulation;Iontophoresis 4mg /ml Dexamethasone;Traction    PT Next Visit Plan posture, thoracic extension, trunk, hip strengthening, lumbpopelvic ccontrol, manual techniques, modalities PRN    PT Home Exercise Plan Medbridge Access Code [email or web address]    Consulted and Agree with Plan of Care Patient           Patient will benefit from skilled therapeutic intervention in order to improve the following deficits and impairments:  Pain, Improper body mechanics, Postural dysfunction, Decreased strength, Decreased range of motion  Visit Diagnosis: Chronic bilateral low back pain, unspecified whether sciatica present  Radiculopathy, lumbar region  Muscle weakness (generalized)     Problem List Patient Active Problem List   Diagnosis Date Noted  . Migraine with aura and without status migrainosus, not intractable 04/06/2019  . Neuropathy 04/06/2019  . Cervical stenosis (uterine cervix) 03/31/2019  . Essential hypertension 04/22/2018  . Hypertriglyceridemia 04/22/2018  . History of chest pain 04/22/2018  . Tobacco abuse 04/22/2018    Joneen Boers PT, DPT   03/20/2020, 12:25 PM  [facility] [address] [place], [address] Phone: [phone]   Fax:  [phone]  Name: FINA HEIZER MRN: [id number] Date of Birth: April 18, 1966

## 2020-03-20 NOTE — Patient Instructions (Addendum)
  Standing R lateral shift correction 10x5 seconds for 3 sets  No low back pain during exercise reported  Reviewed and given as part of her HEP. Pt demonstrated and verbalized understanding. Handout provided. Stop exercise if symptoms increase. Pt verbalized understanding.      Access Code: [email or web address] URL: [email or web address] Date: 03/20/2020 Prepared by: Joneen Boers  Exercises Seated Piriformis Stretch - 3 x daily - 7 x weekly - 3 sets - 3 reps - 30 seconds hold Seated Hip Internal Rotation AROM - 3 x daily - 7 x weekly - 3 sets - 10 reps Seated Transversus Abdominis Bracing - 5 x daily - 7 x weekly - 3 sets - 10 reps - 5 seconds hold Seated Gluteal Sets - 5 x daily - 7 x weekly - 3 sets - 10 reps - 5 seconds hold Seated Scapular Retraction - 5 x daily - 7 x weekly - 3 sets - 10 reps - 5 seconds hold Seated Cervical Retraction - 1 x daily - 7 x weekly - 3 sets - 10 reps - 5 seconds hold Standing Lumbar Extension - 1 x daily - 7 x weekly - 3 sets - 10 reps - 5 second hold

## 2020-03-25 ENCOUNTER — Ambulatory Visit: Payer: Managed Care, Other (non HMO)

## 2020-03-25 ENCOUNTER — Other Ambulatory Visit: Payer: Self-pay

## 2020-03-25 DIAGNOSIS — M5416 Radiculopathy, lumbar region: Secondary | ICD-10-CM

## 2020-03-25 DIAGNOSIS — M6281 Muscle weakness (generalized): Secondary | ICD-10-CM

## 2020-03-25 DIAGNOSIS — M545 Low back pain, unspecified: Secondary | ICD-10-CM | POA: Diagnosis not present

## 2020-03-25 NOTE — Patient Instructions (Addendum)
  Standing L lateral shift correction 10x5 seconds for 2 sets  No R LE pain at rest afterwards in standing and sitting  Reviewed and given as part of HEP.   R lateral shift correction HEP discontinued.   Pt demonstrated and verbalized understanding. Handout provided.     Access Code: [email or web address] URL: [email or web address] Date: 03/25/2020 Prepared by: Joneen Boers  Exercises Seated Piriformis Stretch - 3 x daily - 7 x weekly - 3 sets - 3 reps - 30 seconds hold Seated Hip Internal Rotation AROM - 3 x daily - 7 x weekly - 3 sets - 10 reps Seated Transversus Abdominis Bracing - 5 x daily - 7 x weekly - 3 sets - 10 reps - 5 seconds hold Seated Gluteal Sets - 5 x daily - 7 x weekly - 3 sets - 10 reps - 5 seconds hold Seated Scapular Retraction - 5 x daily - 7 x weekly - 3 sets - 10 reps - 5 seconds hold Seated Cervical Retraction - 1 x daily - 7 x weekly - 3 sets - 10 reps - 5 seconds hold Standing Lumbar Extension - 1 x daily - 7 x weekly - 3 sets - 10 reps - 5 second hold Prone on Elbows Stretch - 2 x daily - 7 x weekly - 1 sets - 2 reps Prone Quadriceps Set - 1 x daily - 7 x weekly - 3 sets - 10 reps - 5 seconds hold

## 2020-03-25 NOTE — Therapy (Signed)
[facility] [address], [place], [address] Phone: [phone]   Fax:  [phone]  Physical Therapy Treatment  Patient Details  Name: Robyn Anderson MRN: [id number] Date of Birth: Oct 16, 1965 Referring Provider (PT): Gurney Maxin, MD   Encounter Date: 03/25/2020   PT End of Session - 03/25/20 0900    Visit Number 5    Number of Visits 17    Date for PT Re-Evaluation 05/02/20    PT Start Time 0901    PT Stop Time 0927    PT Time Calculation (min) 26 min    Activity Tolerance Patient tolerated treatment well    Behavior During Therapy [facility] for tasks assessed/performed           Past Medical History:  Diagnosis Date  . Barrett esophagus   . Coronary artery disease   . GERD (gastroesophageal reflux disease)   . Headache    migraines 1-2/mo  . Hypertension   . Obesity (BMI 30-39.9)   . Postmenopausal bleeding 01/20/2019    Past Surgical History:  Procedure Laterality Date  . ATHERECTOMY Right    renal  . CARPAL TUNNEL RELEASE Left   . CARPAL TUNNEL RELEASE Right 09/27/2015   Procedure: OPEN CARPAL TUNNEL RELEASE RIGHT HAND;  Surgeon: Leanor Kail, MD;  Location: [place];  Service: Orthopedics;  Laterality: Right;  . CESAREAN SECTION     x2  . COLONOSCOPY    . ESOPHAGOGASTRODUODENOSCOPY    . HYSTEROSCOPY WITH D & C N/A 04/18/2019   Procedure: DILATATION AND CURETTAGE /HYSTEROSCOPY;  Surgeon: Aletha Halim, MD;  Location: [place];  Service: Gynecology;  Laterality: N/A;  . LAPAROSCOPIC SALPINGO OOPHERECTOMY Bilateral 04/18/2019   Procedure: LAPAROSCOPIC SALPINGO OOPHORECTOMY;  Surgeon: Aletha Halim, MD;  Location: [place];  Service: Gynecology;  Laterality: Bilateral;  . TUBAL LIGATION      There were no vitals filed for this visit.   Subjective Assessment - 03/25/20 0902    Subjective Has to take her daughter to work at 9:30 am. R  thigh has been bothering her. Low back has been pretty good. R lateral thigh is bothering her. 1/10 low back currently, 5/10 R lateral thigh (L5 dermatome). 2-3/10 low back 7/10 R L at most for the past 7 days.  The R lateral shift corretion home exercise bothers her wiht 3 sets.    Pertinent History Back pain with B LE symptoms R > L around R L5 dermatome. Back has bothered her for about a year. Always had pain in her legs but worsened over a year ago. Felt B anterior thigh burning. sometimes wakes up in the middle of the night with R L5 dermatome pain.  Lyrica and gabapentin do not help. Advil gives her a little of relief.    Diagnostic tests MRI spine November 2021, pt was told to have stenosis in low back as well as arthritis.    Patient Stated Goals Decrease pain, figure out what she can do to help her back and LE feel better.    Currently in Pain? Yes    Pain Score 5     Pain Onset More than a month ago                                     PT Education - 03/25/20 0909    Education Details ther-ex,  HEP    Person(s) Educated Patient    Methods Explanation;Demonstration;Tactile cues;Verbal cues;Handout    Comprehension Returned demonstration;Verbalized understanding           Objective  No latex band allergies  Blood pressure is controlled per pt.       MedbridgeAccess Code [email or web address]   Therapeutic exercise  Pt was recommended to hold off the R lateral shift correction at home.   Standing back extension             10x5 seconds for 2 sets  Decreased R LE pain  Standing L lateral shift correction 10x5 seconds for 2 sets  No R LE pain at rest afterwards in standing and sitting  Reviewed and given as part of HEP.   R lateral shift correction HEP discontinued.   Pt demonstrated and verbalized understanding. Handout provided.   Prone on elbows with B hip IR position with deep breaths 2 minutes   Prone quad set 10x2 with 5 second holds    Seated thoracic extension 10x5 seconds   Improved exercise technique, movement at target joints, use of target muscles after mod verbal, visual, tactile cues.    Response to treatment No back or R LE pain after session.     Clinical impression Continued working on lumbar extension related activities secondary to directional preference. Further decreased R LE symptoms with L lateral shift correction which was given as part of her HEP and discontinued for the R side. Decreased low back and R LE pain with exercises. Pt will benefit from continued skilled physical therapy services to decrease pain, improve strength and function.        PT Short Term Goals - 03/04/20 1132      PT SHORT TERM GOAL #1   Title Pt will be indeoendent with her initial HEP to decrease pain, improve strength and ability to perform standing tasks more comfortably.    Baseline Pt has started her HEP (03/04/2020)    Time 3    Period Weeks    Status New    Target Date 03/28/20             PT Long Term Goals - 03/04/20 1133      PT LONG TERM GOAL #1   Title Patient will have a decrease in low back pain to 5/10 or less at worst to improve sitting and standing tolerance as well as improve ability to perform standing tasks more comfortably.    Baseline 10/10 low back pain at most for the past 3 months. (03/04/2020)    Time 8    Period Weeks    Status New    Target Date 05/02/20      PT LONG TERM GOAL #2   Title Pt will have a decrease in R LE pain to 5/10 or less at worst to promote ability to perform standing tasks more comfortably, as well as improve sitting and standing tolerance.    Baseline 10/10 R LE pain at most for the past 3 months (03/04/2020)    Time 8    Period Weeks    Status New    Target Date 05/02/20      PT LONG TERM GOAL #3   Title Pt will improve bilateral hip strength by at least 1/2 MMT grade to promote ability to perform standing tasks more comfortably.    Time 8    Period  Weeks    Status New    Target Date 05/02/20  PT LONG TERM GOAL #4   Title Pt will improve her lumbar FOTO score by at least 10 points as a demonstration of improved function.    Time 8    Period Weeks    Status New    Target Date 05/02/20                 Plan - 03/25/20 0900    Clinical Impression Statement Continued working on lumbar extension related activities secondary to directional preference. Further decreased R LE symptoms with L lateral shift correction which was given as part of her HEP and discontinued for the R side. Decreased low back and R LE pain with exercises. Pt will benefit from continued skilled physical therapy services to decrease pain, improve strength and function.    Personal Factors and Comorbidities Age;Comorbidity 3+;Fitness;Past/Current Experience;Time since onset of injury/illness/exacerbation    Comorbidities HTN, obesity, hx of C-section    Examination-Activity Limitations Stand;Sit;Sleep    Stability/Clinical Decision Making Stable/Uncomplicated   pain seems to be overall worsening since a year ago based on subjective   Rehab Potential Fair    PT Frequency 2x / week    PT Duration 8 weeks    PT Treatment/Interventions Therapeutic activities;Therapeutic exercise;Neuromuscular re-education;Patient/family education;Manual techniques;Dry needling;Spinal Manipulations;Joint Manipulations;Aquatic Therapy;Electrical Stimulation;Iontophoresis 4mg /ml Dexamethasone;Traction    PT Next Visit Plan posture, thoracic extension, trunk, hip strengthening, lumbpopelvic ccontrol, manual techniques, modalities PRN    PT Home Exercise Plan Medbridge Access Code [email or web address]    Consulted and Agree with Plan of Care Patient           Patient will benefit from skilled therapeutic intervention in order to improve the following deficits and impairments:  Pain, Improper body mechanics, Postural dysfunction, Decreased strength, Decreased range of motion  Visit Diagnosis:  Chronic bilateral low back pain, unspecified whether sciatica present  Radiculopathy, lumbar region  Muscle weakness (generalized)     Problem List Patient Active Problem List   Diagnosis Date Noted  . Migraine with aura and without status migrainosus, not intractable 04/06/2019  . Neuropathy 04/06/2019  . Cervical stenosis (uterine cervix) 03/31/2019  . Essential hypertension 04/22/2018  . Hypertriglyceridemia 04/22/2018  . History of chest pain 04/22/2018  . Tobacco abuse 04/22/2018    Joneen Boers PT, DPT   03/25/2020, 9:38 AM  [facility] [address] [place], [address] Phone: [phone]   Fax:  [phone]  Name: Robyn Anderson MRN: [id number] Date of Birth: [birth year]

## 2020-03-27 ENCOUNTER — Other Ambulatory Visit: Payer: Self-pay

## 2020-03-27 ENCOUNTER — Ambulatory Visit: Payer: Managed Care, Other (non HMO)

## 2020-03-27 DIAGNOSIS — M545 Low back pain, unspecified: Secondary | ICD-10-CM | POA: Diagnosis not present

## 2020-03-27 DIAGNOSIS — M5416 Radiculopathy, lumbar region: Secondary | ICD-10-CM

## 2020-03-27 DIAGNOSIS — G8929 Other chronic pain: Secondary | ICD-10-CM

## 2020-03-27 DIAGNOSIS — M6281 Muscle weakness (generalized): Secondary | ICD-10-CM

## 2020-03-27 NOTE — Therapy (Signed)
[facility] [address], [place], [address] Phone: [phone]   Fax:  [phone]  Physical Therapy Treatment  Patient Details  Name: Robyn Anderson MRN: [id number] Date of Birth: [birth year] Referring Provider (PT): Gurney Maxin, MD   Encounter Date: 03/27/2020   PT End of Session - 03/27/20 0850    Visit Number 6    Number of Visits 17    Date for PT Re-Evaluation 05/02/20    PT Start Time 0851    PT Stop Time 0917    PT Time Calculation (min) 26 min    Activity Tolerance Patient tolerated treatment well    Behavior During Therapy [facility] for tasks assessed/performed           Past Medical History:  Diagnosis Date  . Barrett esophagus   . Coronary artery disease   . GERD (gastroesophageal reflux disease)   . Headache    migraines 1-2/mo  . Hypertension   . Obesity (BMI 30-39.9)   . Postmenopausal bleeding 01/20/2019    Past Surgical History:  Procedure Laterality Date  . ATHERECTOMY Right    renal  . CARPAL TUNNEL RELEASE Left   . CARPAL TUNNEL RELEASE Right 09/27/2015   Procedure: OPEN CARPAL TUNNEL RELEASE RIGHT HAND;  Surgeon: Leanor Kail, MD;  Location: [place];  Service: Orthopedics;  Laterality: Right;  . CESAREAN SECTION     x2  . COLONOSCOPY    . ESOPHAGOGASTRODUODENOSCOPY    . HYSTEROSCOPY WITH D & C N/A 04/18/2019   Procedure: DILATATION AND CURETTAGE /HYSTEROSCOPY;  Surgeon: Aletha Halim, MD;  Location: [place];  Service: Gynecology;  Laterality: N/A;  . LAPAROSCOPIC SALPINGO OOPHERECTOMY Bilateral 04/18/2019   Procedure: LAPAROSCOPIC SALPINGO OOPHORECTOMY;  Surgeon: Aletha Halim, MD;  Location: [place];  Service: Gynecology;  Laterality: Bilateral;  . TUBAL LIGATION      There were no vitals filed for this visit.   Subjective Assessment - 03/27/20 0851    Subjective Back feels ok, no pain currently, no LE symptoms.  Still wakes up due to symptoms but its not as bad.    Pertinent History Back pain with B LE symptoms R > L around R L5 dermatome. Back has bothered her for about a year. Always had pain in her legs but worsened over a year ago. Felt B anterior thigh burning. sometimes wakes up in the middle of the night with R L5 dermatome pain.  Lyrica and gabapentin do not help. Advil gives her a little of relief.    Diagnostic tests MRI spine November 2021, pt was told to have stenosis in low back as well as arthritis.    Patient Stated Goals Decrease pain, figure out what she can do to help her back and LE feel better.    Currently in Pain? No/denies    Pain Onset More than a month ago                                     PT Education - 03/27/20 0854    Education Details ther-ex    Person(s) Educated Patient    Methods Explanation;Demonstration;Verbal cues;Tactile cues    Comprehension Returned demonstration;Verbalized understanding          Objective  No latex band allergies  Blood pressure is controlled per pt.    MedbridgeAccess Code [email or web address]   Therapeutic  exercise  Standing L lateral shift correction 10x5 seconds for 2 sets  Standing back extension 10x5 seconds for 2 sets            Seated manually resisted R lateral shift isometrics in neutral to correct L lateral shift posture 10x5 seconds for 3 sets  Seated thoracic extension 10x5 seconds for 3 sets  Seated chin tucks with B scapular retraction 10x3 with 5 second holds to promote thoracic extension and decrease stress to low back.   Bridge 10x, then 20x  Prone glute max extension   R 10x3  L 10x3  Prone on elbows with B hip IR position with deep breaths 2 minutes   Prone quad set 10x2 with 5 second holds     Improved exercise technique, movement at target joints, use of target muscles after mod verbal, visual, tactile cues.    Response to treatment Pt tolerated  session well without aggravation of symptoms.     Clinical impression Good progress of decreased back pain and LE symptoms from previous sessions. Continued working on extension based exercises secondary to extension preferences. Pt tolerated session well without aggravation of symptoms. Pt will benefit from continued skilled physical therapy services to decrease pain, improve posture, strength, function, and sleep.       PT Short Term Goals - 03/04/20 1132      PT SHORT TERM GOAL #1   Title Pt will be indeoendent with her initial HEP to decrease pain, improve strength and ability to perform standing tasks more comfortably.    Baseline Pt has started her HEP (03/04/2020)    Time 3    Period Weeks    Status New    Target Date 03/28/20             PT Long Term Goals - 03/04/20 1133      PT LONG TERM GOAL #1   Title Patient will have a decrease in low back pain to 5/10 or less at worst to improve sitting and standing tolerance as well as improve ability to perform standing tasks more comfortably.    Baseline 10/10 low back pain at most for the past 3 months. (03/04/2020)    Time 8    Period Weeks    Status New    Target Date 05/02/20      PT LONG TERM GOAL #2   Title Pt will have a decrease in R LE pain to 5/10 or less at worst to promote ability to perform standing tasks more comfortably, as well as improve sitting and standing tolerance.    Baseline 10/10 R LE pain at most for the past 3 months (03/04/2020)    Time 8    Period Weeks    Status New    Target Date 05/02/20      PT LONG TERM GOAL #3   Title Pt will improve bilateral hip strength by at least 1/2 MMT grade to promote ability to perform standing tasks more comfortably.    Time 8    Period Weeks    Status New    Target Date 05/02/20      PT LONG TERM GOAL #4   Title Pt will improve her lumbar FOTO score by at least 10 points as a demonstration of improved function.    Time 8    Period Weeks    Status  New    Target Date 05/02/20  Plan - 03/27/20 0855    Clinical Impression Statement Good progress of decreased back pain and LE symptoms from previous sessions. Continued working on extension based exercises secondary to extension preferences. Pt tolerated session well without aggravation of symptoms. Pt will benefit from continued skilled physical therapy services to decrease pain, improve posture, strength, function, and sleep.    Personal Factors and Comorbidities Age;Comorbidity 3+;Fitness;Past/Current Experience;Time since onset of injury/illness/exacerbation    Comorbidities HTN, obesity, hx of C-section    Examination-Activity Limitations Stand;Sit;Sleep    Stability/Clinical Decision Making Stable/Uncomplicated   pain seems to be overall worsening since a year ago based on subjective   Rehab Potential Fair    PT Frequency 2x / week    PT Duration 8 weeks    PT Treatment/Interventions Therapeutic activities;Therapeutic exercise;Neuromuscular re-education;Patient/family education;Manual techniques;Dry needling;Spinal Manipulations;Joint Manipulations;Aquatic Therapy;Electrical Stimulation;Iontophoresis 4mg /ml Dexamethasone;Traction    PT Next Visit Plan posture, thoracic extension, trunk, hip strengthening, lumbpopelvic ccontrol, manual techniques, modalities PRN    PT Home Exercise Plan Medbridge Access Code [email or web address]    Consulted and Agree with Plan of Care Patient           Patient will benefit from skilled therapeutic intervention in order to improve the following deficits and impairments:  Pain, Improper body mechanics, Postural dysfunction, Decreased strength, Decreased range of motion  Visit Diagnosis: Chronic bilateral low back pain, unspecified whether sciatica present  Radiculopathy, lumbar region  Muscle weakness (generalized)     Problem List Patient Active Problem List   Diagnosis Date Noted  . Migraine with aura and without status  migrainosus, not intractable 04/06/2019  . Neuropathy 04/06/2019  . Cervical stenosis (uterine cervix) 03/31/2019  . Essential hypertension 04/22/2018  . Hypertriglyceridemia 04/22/2018  . History of chest pain 04/22/2018  . Tobacco abuse 04/22/2018     Joneen Boers PT, DPT  03/27/2020, 9:19 AM  [facility] [address] [place], [address] Phone: [phone]   Fax:  [phone]  Name: DARLETTE DUBOW MRN: [id number] Date of Birth: 1965-05-29

## 2020-04-01 ENCOUNTER — Other Ambulatory Visit: Payer: Self-pay

## 2020-04-01 ENCOUNTER — Ambulatory Visit: Payer: Managed Care, Other (non HMO)

## 2020-04-01 DIAGNOSIS — G8929 Other chronic pain: Secondary | ICD-10-CM

## 2020-04-01 DIAGNOSIS — M5416 Radiculopathy, lumbar region: Secondary | ICD-10-CM

## 2020-04-01 DIAGNOSIS — M6281 Muscle weakness (generalized): Secondary | ICD-10-CM

## 2020-04-01 DIAGNOSIS — M545 Low back pain, unspecified: Secondary | ICD-10-CM | POA: Diagnosis not present

## 2020-04-01 NOTE — Therapy (Signed)
[facility] [address], [place], [address] Phone: [phone]   Fax:  [phone]  Physical Therapy Treatment  Patient Details  Name: Robyn Anderson MRN: [id number] Date of Birth: 04/23/1965 Referring Provider (PT): Gurney Maxin, MD   Encounter Date: 04/01/2020   PT End of Session - 04/01/20 0916    Visit Number 7    Number of Visits 17    Date for PT Re-Evaluation 05/02/20    PT Start Time 0846    PT Stop Time 0929    PT Time Calculation (min) 43 min    Activity Tolerance Patient tolerated treatment well    Behavior During Therapy [facility] for tasks assessed/performed           Past Medical History:  Diagnosis Date  . Barrett esophagus   . Coronary artery disease   . GERD (gastroesophageal reflux disease)   . Headache    migraines 1-2/mo  . Hypertension   . Obesity (BMI 30-39.9)   . Postmenopausal bleeding 01/20/2019    Past Surgical History:  Procedure Laterality Date  . ATHERECTOMY Right    renal  . CARPAL TUNNEL RELEASE Left   . CARPAL TUNNEL RELEASE Right 09/27/2015   Procedure: OPEN CARPAL TUNNEL RELEASE RIGHT HAND;  Surgeon: Leanor Kail, MD;  Location: [place];  Service: Orthopedics;  Laterality: Right;  . CESAREAN SECTION     x2  . COLONOSCOPY    . ESOPHAGOGASTRODUODENOSCOPY    . HYSTEROSCOPY WITH D & C N/A 04/18/2019   Procedure: DILATATION AND CURETTAGE /HYSTEROSCOPY;  Surgeon: Aletha Halim, MD;  Location: [place];  Service: Gynecology;  Laterality: N/A;  . LAPAROSCOPIC SALPINGO OOPHERECTOMY Bilateral 04/18/2019   Procedure: LAPAROSCOPIC SALPINGO OOPHORECTOMY;  Surgeon: Aletha Halim, MD;  Location: [place];  Service: Gynecology;  Laterality: Bilateral;  . TUBAL LIGATION      There were no vitals filed for this visit.   Subjective Assessment - 04/01/20 0846    Subjective Patient reported her symptoms still wake her up,  but not as bad.    Pertinent History Back pain with B LE symptoms R > L around R L5 dermatome. Back has bothered her for about a year. Always had pain in her legs but worsened over a year ago. Felt B anterior thigh burning. sometimes wakes up in the middle of the night with R L5 dermatome pain.  Lyrica and gabapentin do not help. Advil gives her a little of relief.    Diagnostic tests MRI spine November 2021, pt was told to have stenosis in low back as well as arthritis.    Patient Stated Goals Decrease pain, figure out what she can do to help her back and LE feel better.    Currently in Pain? No/denies           Objective   No latex band allergies   Blood pressure is controlled per pt.       Medbridge Access Code [email or web address]     Therapeutic exercise   Standing L lateral shift correction 10x5 seconds for 2 sets   Standing back extension             10x5 seconds for 2 sets            Seated manually resisted R lateral shift isometrics in neutral to correct L lateral shift posture 10x5 seconds for 3 sets   Seated thoracic extension 10x5 seconds for 3 sets  Seated chin tucks with B scapular retraction 10x3 with 5 second holds to promote thoracic extension and decrease stress to low back.    Bridge 10x3 with 5 second holds   Prone glute max extension              R 10x3             L 10x3   Prone on elbows with B hip IR position with deep breaths 2 minutes    Prone quad set 10x3 with 5 second holds   Prone quad stretch 2x30sec bilaterally with PT assist   Supine pelvic tilts with glute squeeze, multimodal cues needed x10x5sec holds    Improved exercise technique, movement at target joints, use of target muscles after mod verbal, visual, tactile cues.      Response to treatment Pt tolerated session well without aggravation of symptoms, pt did endorse mild muscle fatigue at end of session. Pt needed cueing for proper glute max activation, but able to complete all exercises  as planned. The patient would benefit from further skilled PT intervention to continue to progress towards goals and maximize pain free function.       PT Education - 04/01/20 0848    Education Details therex    Person(s) Educated Patient    Methods Explanation;Demonstration;Tactile cues;Verbal cues    Comprehension Verbalized understanding;Returned demonstration            PT Short Term Goals - 03/04/20 1132      PT SHORT TERM GOAL #1   Title Pt will be indeoendent with her initial HEP to decrease pain, improve strength and ability to perform standing tasks more comfortably.    Baseline Pt has started her HEP (03/04/2020)    Time 3    Period Weeks    Status New    Target Date 03/28/20             PT Long Term Goals - 03/04/20 1133      PT LONG TERM GOAL #1   Title Patient will have a decrease in low back pain to 5/10 or less at worst to improve sitting and standing tolerance as well as improve ability to perform standing tasks more comfortably.    Baseline 10/10 low back pain at most for the past 3 months. (03/04/2020)    Time 8    Period Weeks    Status New    Target Date 05/02/20      PT LONG TERM GOAL #2   Title Pt will have a decrease in R LE pain to 5/10 or less at worst to promote ability to perform standing tasks more comfortably, as well as improve sitting and standing tolerance.    Baseline 10/10 R LE pain at most for the past 3 months (03/04/2020)    Time 8    Period Weeks    Status New    Target Date 05/02/20      PT LONG TERM GOAL #3   Title Pt will improve bilateral hip strength by at least 1/2 MMT grade to promote ability to perform standing tasks more comfortably.    Time 8    Period Weeks    Status New    Target Date 05/02/20      PT LONG TERM GOAL #4   Title Pt will improve her lumbar FOTO score by at least 10 points as a demonstration of improved function.    Time 8    Period Weeks  Status New    Target Date 05/02/20                  Plan - 04/01/20 0914    Clinical Impression Statement Pt tolerated session well without aggravation of symptoms, pt did endorse mild muscle fatigue at end of session. Pt needed cueing for proper glute max activation, but able to complete all exercises as planned. The patient would benefit from further skilled PT intervention to continue to progress towards goals and maximize pain free function.    Personal Factors and Comorbidities Age;Comorbidity 3+;Fitness;Past/Current Experience;Time since onset of injury/illness/exacerbation    Comorbidities HTN, obesity, hx of C-section    Examination-Activity Limitations Stand;Sit;Sleep    Stability/Clinical Decision Making Stable/Uncomplicated   based on subjective pain seems to have been worsening for about a year   Rehab Potential Fair    PT Frequency 2x / week    PT Duration 8 weeks    PT Treatment/Interventions Therapeutic activities;Therapeutic exercise;Neuromuscular re-education;Patient/family education;Manual techniques;Dry needling;Spinal Manipulations;Joint Manipulations;Aquatic Therapy;Electrical Stimulation;Iontophoresis 4mg /ml Dexamethasone;Traction    PT Next Visit Plan posture, thoracic extension, trunk, hip strengthening, lumbpopelvic ccontrol, manual techniques, modalities PRN    PT Home Exercise Plan Medbridge Access Code [email or web address]    Consulted and Agree with Plan of Care Patient           Patient will benefit from skilled therapeutic intervention in order to improve the following deficits and impairments:  Pain,Improper body mechanics,Postural dysfunction,Decreased strength,Decreased range of motion  Visit Diagnosis: Chronic bilateral low back pain, unspecified whether sciatica present  Radiculopathy, lumbar region  Muscle weakness (generalized)     Problem List Patient Active Problem List   Diagnosis Date Noted  . Migraine with aura and without status migrainosus, not intractable 04/06/2019  . Neuropathy  04/06/2019  . Cervical stenosis (uterine cervix) 03/31/2019  . Essential hypertension 04/22/2018  . Hypertriglyceridemia 04/22/2018  . History of chest pain 04/22/2018  . Tobacco abuse 04/22/2018    Lieutenant Diego PT, DPT 9:30 AM,04/01/20   [facility] [address], [place], [address] Phone: [phone]   Fax:  [phone]  Name: Robyn Anderson MRN: [id number] Date of Birth: Sep 23, 1965

## 2020-04-03 ENCOUNTER — Ambulatory Visit: Payer: Managed Care, Other (non HMO)

## 2020-04-04 ENCOUNTER — Other Ambulatory Visit: Payer: Self-pay

## 2020-04-04 ENCOUNTER — Ambulatory Visit: Payer: Managed Care, Other (non HMO)

## 2020-04-04 DIAGNOSIS — G8929 Other chronic pain: Secondary | ICD-10-CM

## 2020-04-04 DIAGNOSIS — M6281 Muscle weakness (generalized): Secondary | ICD-10-CM

## 2020-04-04 DIAGNOSIS — M5416 Radiculopathy, lumbar region: Secondary | ICD-10-CM

## 2020-04-04 DIAGNOSIS — M545 Low back pain, unspecified: Secondary | ICD-10-CM | POA: Diagnosis not present

## 2020-04-04 NOTE — Therapy (Signed)
[facility] [address], [place], [address] Phone: [phone]   Fax:  [phone]  Physical Therapy Treatment  Patient Details  Name: Robyn Anderson MRN: [id number] Date of Birth: 03-14-1966 Referring Provider (PT): Gurney Maxin, MD   Encounter Date: 04/04/2020   PT End of Session - 04/04/20 1035    Visit Number 8    Number of Visits 17    Date for PT Re-Evaluation 05/02/20    PT Start Time 7253    PT Stop Time 1114    PT Time Calculation (min) 39 min    Activity Tolerance Patient tolerated treatment well    Behavior During Therapy [facility] for tasks assessed/performed           Past Medical History:  Diagnosis Date  . Barrett esophagus   . Coronary artery disease   . GERD (gastroesophageal reflux disease)   . Headache    migraines 1-2/mo  . Hypertension   . Obesity (BMI 30-39.9)   . Postmenopausal bleeding 01/20/2019    Past Surgical History:  Procedure Laterality Date  . ATHERECTOMY Right    renal  . CARPAL TUNNEL RELEASE Left   . CARPAL TUNNEL RELEASE Right 09/27/2015   Procedure: OPEN CARPAL TUNNEL RELEASE RIGHT HAND;  Surgeon: Leanor Kail, MD;  Location: [facility];  Service: Orthopedics;  Laterality: Right;  . CESAREAN SECTION     x2  . COLONOSCOPY    . ESOPHAGOGASTRODUODENOSCOPY    . HYSTEROSCOPY WITH D & C N/A 04/18/2019   Procedure: DILATATION AND CURETTAGE /HYSTEROSCOPY;  Surgeon: Aletha Halim, MD;  Location: [facility];  Service: Gynecology;  Laterality: N/A;  . LAPAROSCOPIC SALPINGO OOPHERECTOMY Bilateral 04/18/2019   Procedure: LAPAROSCOPIC SALPINGO OOPHORECTOMY;  Surgeon: Aletha Halim, MD;  Location: [place];  Service: Gynecology;  Laterality: Bilateral;  . TUBAL LIGATION      There were no vitals filed for this visit.   Subjective Assessment - 04/04/20 1036    Subjective Back is ok, no pain currently. No pain B LE. 2/10  low back 5/10 R LE pain at most for the past 7 days.  Had a flu shot 2 weeks ago and ever since, she feels cold. Feels fine, no fever. Sleeping is better. Did not get very much sleep Tuesday and Wednesday.    Pertinent History Back pain with B LE symptoms R > L around R L5 dermatome. Back has bothered her for about a year. Always had pain in her legs but worsened over a year ago. Felt B anterior thigh burning. sometimes wakes up in the middle of the night with R L5 dermatome pain.  Lyrica and gabapentin do not help. Advil gives her a little of relief.    Diagnostic tests MRI spine November 2021, pt was told to have stenosis in low back as well as arthritis.    Patient Stated Goals Decrease pain, figure out what she can do to help her back and LE feel better.    Currently in Pain? No/denies    Pain Score 0-No pain                                    Objective  No latex band allergies  Blood pressure is controlled per pt.    MedbridgeAccess Code [email or web address]  Manual therapy Prone R UPA to L5 TP to promote mobility and  joint nutrition Prone STM R lumbar thoracic paraspinal muscle to decrease tension   Therapeutic exercise  Prone glute max extension              R 10x3             L 10x3  Prone press-ups 10x3  Seated manually resisted R lateral shift isometrics in neutral to correct L lateral shift posture 10x5 seconds for 3 sets  Seated thoracic extension 10x5 seconds for 3 sets  Seated manually resisted hip extension isometrics, PT manual resistance  R 10x5 seconds for 2 sets  L 10x5 seconds for 2 sets   Improved exercise technique, movement at target joints, use of target muscles after mod verbal, visual, tactile cues.    Response to treatment Pt tolerated session well without aggravation of symptoms.   Clinical impression Making very good progress with decreased low back and LE pain based on subjective reports. Continued working on  gentle thoracic and lumbar extension as as glute strengthening and posture to promote progress. Pt tolerated session well without aggravation of symptoms. Pt will benefit from continued skilled physical therapy services to decrease pain, improve strength and function.        PT Education - 04/04/20 1054    Education Details ther-ex    Person(s) Educated Patient    Methods Explanation;Demonstration;Tactile cues;Verbal cues    Comprehension Returned demonstration;Verbalized understanding            PT Short Term Goals - 03/04/20 1132      PT SHORT TERM GOAL #1   Title Pt will be indeoendent with her initial HEP to decrease pain, improve strength and ability to perform standing tasks more comfortably.    Baseline Pt has started her HEP (03/04/2020)    Time 3    Period Weeks    Status New    Target Date 03/28/20             PT Long Term Goals - 03/04/20 1133      PT LONG TERM GOAL #1   Title Patient will have a decrease in low back pain to 5/10 or less at worst to improve sitting and standing tolerance as well as improve ability to perform standing tasks more comfortably.    Baseline 10/10 low back pain at most for the past 3 months. (03/04/2020)    Time 8    Period Weeks    Status New    Target Date 05/02/20      PT LONG TERM GOAL #2   Title Pt will have a decrease in R LE pain to 5/10 or less at worst to promote ability to perform standing tasks more comfortably, as well as improve sitting and standing tolerance.    Baseline 10/10 R LE pain at most for the past 3 months (03/04/2020)    Time 8    Period Weeks    Status New    Target Date 05/02/20      PT LONG TERM GOAL #3   Title Pt will improve bilateral hip strength by at least 1/2 MMT grade to promote ability to perform standing tasks more comfortably.    Time 8    Period Weeks    Status New    Target Date 05/02/20      PT LONG TERM GOAL #4   Title Pt will improve her lumbar FOTO score by at least 10 points as  a demonstration of improved function.    Time 8  Period Weeks    Status New    Target Date 05/02/20                 Plan - 04/04/20 1055    Clinical Impression Statement Making very good progress with decreased low back and LE pain based on subjective reports. Continued working on gentle thoracic and lumbar extension as as glute strengthening and posture to promote progress. Pt tolerated session well without aggravation of symptoms. Pt will benefit from continued skilled physical therapy services to decrease pain, improve strength and function.    Personal Factors and Comorbidities Age;Comorbidity 3+;Fitness;Past/Current Experience;Time since onset of injury/illness/exacerbation    Comorbidities HTN, obesity, hx of C-section    Examination-Activity Limitations Stand;Sit;Sleep    Stability/Clinical Decision Making Stable/Uncomplicated   based on subjective pain seems to have been worsening for about a year   Rehab Potential Fair    PT Frequency 2x / week    PT Duration 8 weeks    PT Treatment/Interventions Therapeutic activities;Therapeutic exercise;Neuromuscular re-education;Patient/family education;Manual techniques;Dry needling;Spinal Manipulations;Joint Manipulations;Aquatic Therapy;Electrical Stimulation;Iontophoresis 4mg /ml Dexamethasone;Traction    PT Next Visit Plan posture, thoracic extension, trunk, hip strengthening, lumbpopelvic ccontrol, manual techniques, modalities PRN    PT Home Exercise Plan Medbridge Access Code [email or web address]    Consulted and Agree with Plan of Care Patient           Patient will benefit from skilled therapeutic intervention in order to improve the following deficits and impairments:  Pain,Improper body mechanics,Postural dysfunction,Decreased strength,Decreased range of motion  Visit Diagnosis: Chronic bilateral low back pain, unspecified whether sciatica present  Radiculopathy, lumbar region  Muscle weakness (generalized)     Problem  List Patient Active Problem List   Diagnosis Date Noted  . Migraine with aura and without status migrainosus, not intractable 04/06/2019  . Neuropathy 04/06/2019  . Cervical stenosis (uterine cervix) 03/31/2019  . Essential hypertension 04/22/2018  . Hypertriglyceridemia 04/22/2018  . History of chest pain 04/22/2018  . Tobacco abuse 04/22/2018    Joneen Boers PT, DPT   04/04/2020, 3:46 PM  [facility] [address] [place], [address] Phone: [phone]   Fax:  [phone]  Name: JANYAH SINGLETERRY MRN: [id number] Date of Birth: 1965/12/19

## 2020-05-23 ENCOUNTER — Ambulatory Visit (HOSPITAL_COMMUNITY)
Admission: EM | Admit: 2020-05-23 | Discharge: 2020-05-23 | Disposition: A | Discharge status: Home / Self Care | Payer: Managed Care, Other (non HMO) | Attending: Student | Admitting: Student

## 2020-05-23 ENCOUNTER — Encounter (HOSPITAL_COMMUNITY): Payer: Self-pay | Admitting: Emergency Medicine

## 2020-05-23 ENCOUNTER — Other Ambulatory Visit: Payer: Self-pay

## 2020-05-23 DIAGNOSIS — J069 Acute upper respiratory infection, unspecified: Secondary | ICD-10-CM | POA: Insufficient documentation

## 2020-05-23 DIAGNOSIS — U071 COVID-19: Secondary | ICD-10-CM | POA: Diagnosis not present

## 2020-05-23 DIAGNOSIS — Z8709 Personal history of other diseases of the respiratory system: Secondary | ICD-10-CM | POA: Diagnosis present

## 2020-05-23 DIAGNOSIS — Z1152 Encounter for screening for COVID-19: Secondary | ICD-10-CM | POA: Insufficient documentation

## 2020-05-23 DIAGNOSIS — Z20822 Contact with and (suspected) exposure to covid-19: Secondary | ICD-10-CM | POA: Diagnosis present

## 2020-05-23 DIAGNOSIS — J0110 Acute frontal sinusitis, unspecified: Secondary | ICD-10-CM | POA: Diagnosis not present

## 2020-05-23 LAB — SARS CORONAVIRUS 2 (TAT 6-24 HRS): SARS Coronavirus 2: POSITIVE — AB

## 2020-05-23 MED ORDER — ALBUTEROL SULFATE HFA 108 (90 BASE) MCG/ACT IN AERS: 1.0000 | INHALATION_SPRAY | Freq: Four times a day (QID) | RESPIRATORY_TRACT | 0 refills | Status: AC | PRN

## 2020-05-23 MED ORDER — LIDOCAINE VISCOUS HCL 2 % MT SOLN: 15.0000 mL | OROMUCOSAL | 0 refills | Status: AC | PRN

## 2020-05-23 MED ORDER — AMOXICILLIN-POT CLAVULANATE 875-125 MG PO TABS: 1.0000 | ORAL_TABLET | Freq: Two times a day (BID) | ORAL | 0 refills | Status: DC

## 2020-05-23 NOTE — ED Provider Notes (Signed)
[facility]    CSN: [id number] Arrival date & time: 05/23/20  0840      History   Chief Complaint Chief Complaint  Patient presents with  . Chills  . Generalized Body Aches  . Sore Throat  . Nasal Congestion    HPI Robyn FOWLIE is a 55 y.o. female Presenting for URI symptoms for 7 days. History of asthma controlled on albuterol inhaler, barrett esophagus, CAD, GERD, migraine headaches, obesity. Presenting today with sore throat, drainage, productive cough, body aches, chills, two episodes of diarrhea 1 day ago. Denies current n/v/d/abd pain, shortness of breath, chest pain,  facial pain, teeth pain, headaches,  loss of taste/smell, swollen lymph nodes, ear pain. Denies chest pain, shortness of breath, confusion, high fevers. Not vaccinated for covid-19.   HPI  Past Medical History:  Diagnosis Date  . Barrett esophagus   . Coronary artery disease   . GERD (gastroesophageal reflux disease)   . Headache    migraines 1-2/mo  . Hypertension   . Obesity (BMI 30-39.9)   . Postmenopausal bleeding 01/20/2019    Patient Active Problem List   Diagnosis Date Noted  . Migraine with aura and without status migrainosus, not intractable 04/06/2019  . Neuropathy 04/06/2019  . Cervical stenosis (uterine cervix) 03/31/2019  . Essential hypertension 04/22/2018  . Hypertriglyceridemia 04/22/2018  . History of chest pain 04/22/2018  . Tobacco abuse 04/22/2018    Past Surgical History:  Procedure Laterality Date  . ATHERECTOMY Right    renal  . CARPAL TUNNEL RELEASE Left   . CARPAL TUNNEL RELEASE Right 09/27/2015   Procedure: OPEN CARPAL TUNNEL RELEASE RIGHT HAND;  Surgeon: Leanor Kail, MD;  Location: [facility];  Service: Orthopedics;  Laterality: Right;  . CESAREAN SECTION     x2  . COLONOSCOPY    . ESOPHAGOGASTRODUODENOSCOPY    . HYSTEROSCOPY WITH D & C N/A 04/18/2019   Procedure: DILATATION AND CURETTAGE /HYSTEROSCOPY;  Surgeon: Aletha Halim, MD;   Location: [facility];  Service: Gynecology;  Laterality: N/A;  . LAPAROSCOPIC SALPINGO OOPHERECTOMY Bilateral 04/18/2019   Procedure: LAPAROSCOPIC SALPINGO OOPHORECTOMY;  Surgeon: Aletha Halim, MD;  Location: [facility];  Service: Gynecology;  Laterality: Bilateral;  . TUBAL LIGATION      OB History    Gravida  2   Para  2   Term      Preterm      AB      Living  2     SAB      IAB      Ectopic      Multiple      Live Births  2            Home Medications    Prior to Admission medications   Medication Sig Start Date End Date Taking? Authorizing Provider  albuterol (VENTOLIN HFA) 108 (90 Base) MCG/ACT inhaler Inhale 1-2 puffs into the lungs every 6 (six) hours as needed for wheezing or shortness of breath. 05/23/20  Yes Hazel Sams, PA-C  amoxicillin-clavulanate (AUGMENTIN) 875-125 MG tablet Take 1 tablet by mouth every 12 (twelve) hours. 05/23/20  Yes Hazel Sams, PA-C  lidocaine (XYLOCAINE) 2 % solution Use as directed 15 mLs in the mouth or throat as needed for mouth pain. 05/23/20  Yes Hazel Sams, PA-C  albuterol (VENTOLIN HFA) 108 (90 Base) MCG/ACT inhaler Inhale into the lungs. 08/16/17   [provider]  Cholecalciferol (VITAMIN D3) 1.25 MG (50000  UT) CAPS Take 1 capsule by mouth once a week. 08/29/19   [provider]  esomeprazole (NEXIUM) 40 MG capsule Take 40 mg by mouth daily at 12 noon.    [provider]  hyoscyamine (LEVSIN) 0.125 MG tablet Take by mouth 3 (three) times daily as needed. 05/16/19   [provider]  lisinopril (ZESTRIL) 30 MG tablet TAKE 1 TABLET(30 MG) BY MOUTH DAILY 12/15/19   Troy Sine, MD  nortriptyline (PAMELOR) 10 MG capsule Take 10 mg by mouth at bedtime. 1 capsule by mouth nightly for one week then increase to 2 capsules nightly    [provider]  ondansetron (ZOFRAN) 4 MG tablet Take 1 tablet (4 mg total) by mouth every 8 (eight) hours as  needed for nausea or vomiting. 12/12/19   Darr, Edison Nasuti, PA-C  pregabalin (LYRICA) 50 MG capsule Take 50 mg by mouth 3 (three) times daily.    [provider]  SUMAtriptan (IMITREX) 25 MG tablet Take 25 mg by mouth every 2 (two) hours as needed for migraine. May repeat in 2 hours if headache persists or recurs.    [provider]    Family History Family History  Problem Relation Age of Onset  . Cancer Mother   . Hypertension Mother   . Cancer Father   . Hypertension Father   . Hypertension Sister   . Hypertension Brother   . Diabetes Paternal Grandmother   . Stroke Maternal Grandmother   . Epilepsy Son     Social History Social History   Tobacco Use  . Smoking status: Current Every Day Smoker    Packs/day: 0.50    Years: 30.00    Pack years: 15.00    Types: Cigarettes  . Smokeless tobacco: Never Used  Substance Use Topics  . Alcohol use: No  . Drug use: No     Allergies   Atorvastatin, Topamax [topiramate], and Vicodin [hydrocodone-acetaminophen]   Review of Systems Review of Systems  Constitutional: Positive for chills. Negative for appetite change and fever.  HENT: Positive for congestion and sore throat. Negative for ear pain, rhinorrhea, sinus pressure and sinus pain.   Eyes: Negative for redness and visual disturbance.  Respiratory: Positive for cough. Negative for chest tightness, shortness of breath and wheezing.   Cardiovascular: Negative for chest pain and palpitations.  Gastrointestinal: Negative for abdominal pain, constipation, diarrhea, nausea and vomiting.  Genitourinary: Negative for dysuria, frequency and urgency.  Musculoskeletal: Negative for myalgias.  Neurological: Negative for dizziness, weakness and headaches.  Psychiatric/Behavioral: Negative for confusion.  All other systems reviewed and are negative.    Physical Exam Triage Vital Signs ED Triage Vitals  Enc Vitals Group     BP 05/23/20 0919 127/73     Pulse Rate  05/23/20 0919 71     Resp 05/23/20 0919 18     Temp 05/23/20 0919 98.5 F (36.9 C)     Temp Source 05/23/20 0919 Oral     SpO2 05/23/20 0919 97 %     Weight --      Height --      Head Circumference --      Peak Flow --      Pain Score 05/23/20 0917 8     Pain Loc --      Pain Edu? --      Excl. in [place]? --    No data found.  Updated Vital Signs BP 127/73 (BP Location: Right Arm)   Pulse 71  Temp 98.5 F (36.9 C) (Oral)   Resp 18   LMP 07/30/2017   SpO2 97%   Visual Acuity Right Eye Distance:   Left Eye Distance:   Bilateral Distance:    Right Eye Near:   Left Eye Near:    Bilateral Near:     Physical Exam Vitals reviewed.  Constitutional:      General: She is not in acute distress.    Appearance: Normal appearance. She is not ill-appearing.  HENT:     Head: Normocephalic and atraumatic.     Right Ear: Hearing, tympanic membrane, ear canal and external ear normal. No swelling or tenderness. There is no impacted cerumen. No mastoid tenderness. Tympanic membrane is not perforated, erythematous, retracted or bulging.     Left Ear: Hearing, tympanic membrane, ear canal and external ear normal. No swelling or tenderness. There is no impacted cerumen. No mastoid tenderness. Tympanic membrane is not perforated, erythematous, retracted or bulging.     Nose:     Right Sinus: Frontal sinus tenderness present. No maxillary sinus tenderness.     Left Sinus: Frontal sinus tenderness present. No maxillary sinus tenderness.     Mouth/Throat:     Mouth: Mucous membranes are moist.     Pharynx: Uvula midline. Posterior oropharyngeal erythema present. No oropharyngeal exudate.     Tonsils: No tonsillar exudate. 1+ on the right. 1+ on the left.     Comments: Smooth erythema posterior pharynx No cervical lymphadenopathy Cardiovascular:     Rate and Rhythm: Normal rate and regular rhythm.     Heart sounds: Normal heart sounds.  Pulmonary:     Breath sounds: Normal breath sounds and  air entry. No wheezing, rhonchi or rales.  Chest:     Chest wall: No tenderness.  Abdominal:     General: Abdomen is flat. Bowel sounds are normal.     Tenderness: There is no abdominal tenderness. There is no guarding or rebound.  Lymphadenopathy:     Cervical: No cervical adenopathy.  Neurological:     General: No focal deficit present.     Mental Status: She is alert and oriented to person, place, and time.  Psychiatric:        Attention and Perception: Attention and perception normal.        Mood and Affect: Mood and affect normal.        Behavior: Behavior normal. Behavior is cooperative.        Thought Content: Thought content normal.        Judgment: Judgment normal.      UC Treatments / Results  Labs (all labs ordered are listed, but only abnormal results are displayed) Labs Reviewed  SARS CORONAVIRUS 2 (TAT 6-24 HRS)    EKG   Radiology No results found.  Procedures Procedures (including critical care time)  Medications Ordered in UC Medications - No data to display  Initial Impression / Assessment and Plan / UC Course  I have reviewed the triage vital signs and the nursing notes.  Pertinent labs & imaging results that were available during my care of the patient were reviewed by me and considered in my medical decision making (see chart for details).      afebrile nontachycardic nontachypneic, oxygenating well on room air. No wheezes rhonchi or rales. Patient denies shortness of breath. Patient with history of asthma; albuterol refilled today. Patient with positive exposure to covid19 at work; covid test sent. Isolation per CDC guidelines.  For sinusitis, augmentin sent.  Return precautions- chest pain, shortness of breath, new/worsening fevers/chills, confusion, worsening of symptoms despite the above treatment plan, etc.    Spent over 40 minutes obtaining H&P, performing physical, discussing results, treatment plan and plan for follow-up with patient.  Patient agrees with plan.    Final Clinical Impressions(s) / UC Diagnoses   Final diagnoses:  Viral URI with cough  History of asthma  Encounter for screening for COVID-19  Acute non-recurrent frontal sinusitis  Exposure to COVID-19 virus     Discharge Instructions     -Albuterol as needed for cough/shortness of breath -Augmentin twice daily for 7 days  -For fevers/chills, body aches, headaches- use Tylenol and Ibuprofen. You can alternate these for maximum effect. Use up to 3000mg  Tylenol daily and 3200mg  Ibuprofen daily. Make sure to take ibuprofen with food. Check the bottle of ibuprofen/tylenol for specific dosage instructions.  We are currently awaiting result of your PCR covid-19 test. This typically comes back in 1-2 days. We'll call you if the result is positive. Otherwise, the result will be sent electronically to your MyChart. You can also call this clinic and ask for your result via telephone.   Please isolate at home while awaiting these results. If your test is positive for Covid-19, continue to isolate at home for 5 days if you have mild symptoms, or a total of 10 days from symptom onset if you have more severe symptoms. If you quarantine for a shorter period of time (i.e. 5 days), make sure to wear a mask until day 10 of symptoms. Treat your symptoms at home with OTC remedies like tylenol/ibuprofen, mucinex, nyquil, etc. Seek medical attention if you develop high fevers, chest pain, shortness of breath, ear pain, facial pain, etc. Make sure to get up and move around every 2-3 hours while convalescing to help prevent blood clots. Drink plenty of fluids, and rest as much as possible.    ED Prescriptions    Medication Sig Dispense Auth. Provider   albuterol (VENTOLIN HFA) 108 (90 Base) MCG/ACT inhaler Inhale 1-2 puffs into the lungs every 6 (six) hours as needed for wheezing or shortness of breath. 1 each Hazel Sams, PA-C   amoxicillin-clavulanate (AUGMENTIN) 875-125 MG  tablet Take 1 tablet by mouth every 12 (twelve) hours. 14 tablet Marin Roberts E, PA-C   lidocaine (XYLOCAINE) 2 % solution Use as directed 15 mLs in the mouth or throat as needed for mouth pain. 100 mL Hazel Sams, PA-C     PDMP not reviewed this encounter.   Hazel Sams, PA-C 05/23/20 1017

## 2020-05-23 NOTE — ED Triage Notes (Signed)
Pt presents with sore throat, drainage, body aches, and chills xs 1 week.

## 2020-05-23 NOTE — Discharge Instructions (Addendum)
-  Albuterol as needed for cough/shortness of breath -Augmentin twice daily for 7 days  -For fevers/chills, body aches, headaches- use Tylenol and Ibuprofen. You can alternate these for maximum effect. Use up to 3000mg  Tylenol daily and 3200mg  Ibuprofen daily. Make sure to take ibuprofen with food. Check the bottle of ibuprofen/tylenol for specific dosage instructions.  We are currently awaiting result of your PCR covid-19 test. This typically comes back in 1-2 days. We'll call you if the result is positive. Otherwise, the result will be sent electronically to your MyChart. You can also call this clinic and ask for your result via telephone.   Please isolate at home while awaiting these results. If your test is positive for Covid-19, continue to isolate at home for 5 days if you have mild symptoms, or a total of 10 days from symptom onset if you have more severe symptoms. If you quarantine for a shorter period of time (i.e. 5 days), make sure to wear a mask until day 10 of symptoms. Treat your symptoms at home with OTC remedies like tylenol/ibuprofen, mucinex, nyquil, etc. Seek medical attention if you develop high fevers, chest pain, shortness of breath, ear pain, facial pain, etc. Make sure to get up and move around every 2-3 hours while convalescing to help prevent blood clots. Drink plenty of fluids, and rest as much as possible.

## 2020-05-24 ENCOUNTER — Telehealth: Payer: Self-pay

## 2020-05-24 NOTE — Telephone Encounter (Signed)
Called to discuss with patient about COVID-19 symptoms and the use of one of the available treatments for those with mild to moderate Covid symptoms and at a high risk of hospitalization.  Pt appears to qualify for outpatient treatment due to co-morbid conditions and/or a member of an at-risk group in accordance with the FDA Emergency Use Authorization.    Symptom onset: 05/15/20 Vaccinated: No Booster? No Immunocompromised? No Qualifiers: HTN, CAD  Pt. Is out of 7 day window for treatment. Robyn Anderson

## 2020-06-27 ENCOUNTER — Other Ambulatory Visit: Payer: Self-pay | Admitting: Orthopedic Surgery

## 2020-06-27 DIAGNOSIS — M5412 Radiculopathy, cervical region: Secondary | ICD-10-CM

## 2020-06-27 DIAGNOSIS — M5030 Other cervical disc degeneration, unspecified cervical region: Secondary | ICD-10-CM

## 2020-06-27 DIAGNOSIS — M542 Cervicalgia: Secondary | ICD-10-CM

## 2020-06-27 DIAGNOSIS — M4802 Spinal stenosis, cervical region: Secondary | ICD-10-CM

## 2020-07-09 ENCOUNTER — Ambulatory Visit
Admission: RE | Admit: 2020-07-09 | Discharge: 2020-07-09 | Disposition: A | Discharge status: Home / Self Care | Payer: Managed Care, Other (non HMO) | Source: Ambulatory Visit | Attending: Orthopedic Surgery | Admitting: Orthopedic Surgery

## 2020-07-09 ENCOUNTER — Other Ambulatory Visit: Payer: Self-pay

## 2020-07-09 DIAGNOSIS — M5030 Other cervical disc degeneration, unspecified cervical region: Secondary | ICD-10-CM | POA: Insufficient documentation

## 2020-07-09 DIAGNOSIS — M542 Cervicalgia: Secondary | ICD-10-CM | POA: Insufficient documentation

## 2020-07-09 DIAGNOSIS — M5412 Radiculopathy, cervical region: Secondary | ICD-10-CM | POA: Insufficient documentation

## 2020-07-09 DIAGNOSIS — M4802 Spinal stenosis, cervical region: Secondary | ICD-10-CM | POA: Diagnosis present

## 2020-07-24 ENCOUNTER — Other Ambulatory Visit (HOSPITAL_COMMUNITY): Payer: Self-pay | Admitting: Physical Medicine & Rehabilitation

## 2020-07-24 ENCOUNTER — Other Ambulatory Visit: Payer: Self-pay | Admitting: Physical Medicine & Rehabilitation

## 2020-07-24 DIAGNOSIS — G8929 Other chronic pain: Secondary | ICD-10-CM

## 2020-07-30 ENCOUNTER — Other Ambulatory Visit: Payer: Self-pay

## 2020-07-30 ENCOUNTER — Ambulatory Visit: Payer: Managed Care, Other (non HMO) | Attending: Physical Medicine & Rehabilitation

## 2020-07-30 ENCOUNTER — Ambulatory Visit: Payer: Managed Care, Other (non HMO)

## 2020-07-30 DIAGNOSIS — M542 Cervicalgia: Secondary | ICD-10-CM | POA: Insufficient documentation

## 2020-07-30 DIAGNOSIS — M5412 Radiculopathy, cervical region: Secondary | ICD-10-CM | POA: Insufficient documentation

## 2020-07-30 NOTE — Therapy (Signed)
[facility] [address] [place], [address] Phone: [phone]   Fax:  [phone]  Physical Therapy Evaluation  Patient Details  Name: Robyn Anderson MRN: [id number] Date of Birth: Jul 01, 1965 Referring Provider (PT): Girtha Hake MD   Encounter Date: 07/30/2020   PT End of Session - 07/30/20 1413    Visit Number 1    Number of Visits 17    Date for PT Re-Evaluation 09/24/20    Authorization Type [employer]    Authorization Time Period 07/30/20 - 10/22/20    PT Start Time 0945    PT Stop Time 1030    PT Time Calculation (min) 45 min    Activity Tolerance Patient tolerated treatment well    Behavior During Therapy [facility] for tasks assessed/performed           Past Medical History:  Diagnosis Date  . Barrett esophagus   . Coronary artery disease   . GERD (gastroesophageal reflux disease)   . Headache    migraines 1-2/mo  . Hypertension   . Obesity (BMI 30-39.9)   . Postmenopausal bleeding 01/20/2019    Past Surgical History:  Procedure Laterality Date  . ATHERECTOMY Right    renal  . CARPAL TUNNEL RELEASE Left   . CARPAL TUNNEL RELEASE Right 09/27/2015   Procedure: OPEN CARPAL TUNNEL RELEASE RIGHT HAND;  Surgeon: Leanor Kail, MD;  Location: [facility];  Service: Orthopedics;  Laterality: Right;  . CESAREAN SECTION     x2  . COLONOSCOPY    . ESOPHAGOGASTRODUODENOSCOPY    . HYSTEROSCOPY WITH D & C N/A 04/18/2019   Procedure: DILATATION AND CURETTAGE /HYSTEROSCOPY;  Surgeon: Aletha Halim, MD;  Location: [place];  Service: Gynecology;  Laterality: N/A;  . LAPAROSCOPIC SALPINGO OOPHERECTOMY Bilateral 04/18/2019   Procedure: LAPAROSCOPIC SALPINGO OOPHORECTOMY;  Surgeon: Aletha Halim, MD;  Location: [place];  Service: Gynecology;  Laterality: Bilateral;  . TUBAL LIGATION      There were no vitals filed for this visit.    Subjective Assessment -  07/30/20 1351    Subjective Patient presents to OPPT with cervical neck pain with radiating symptoms that cause numbness in thumb and index fingers. Patient reports increased pain while performing work duties.    Pertinent History Robyn Anderson is a pleasant 29yoF that was referred to OPPT by Dr. Alba Destine, MD for an evaluation of cervical neck pain with radicular symptoms. The onset of this condition was January 2022 with no specified mechanism of injury. Symptoms include sharp neck/ RUE pain with numbness/throbbing in the thumb/index fingers. Patient had a cervical MRI 3/22 and has a pending lumbar MRI scheduled April 16th.  Patient also reports chronic low back pain with R LE sciatica, previously seen at our clinic for this. Patient has had surgery in B hands for carpal tunnel. Patient is L hand dominant and reports previous history of pain in L UE but does not bother as much as the R UE. 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 Patient states that symptoms are exacebated when clicking a computer mouse at work with R hand. Patient was taken out of work on March 5TH due to condition. Patient would like to return to work with decreased pain.    Limitations Lifting;House hold activities    Patient Stated Goals To return to work    Currently in Pain? Yes    Pain Score 2  Pain Location Neck    Pain Descriptors / Indicators Sharp    Pain Type Chronic pain    Pain Radiating Towards R thumb and index fingers    Pain Onset More than a month ago    Pain Frequency Intermittent    Aggravating Factors  manipulating objects, washing dishes, lifting objects    Pain Relieving Factors Advil    Effect of Pain on Daily Activities work tasks    Multiple Pain Sites Yes    Pain Score 7    Pain Location Arm    Pain Orientation Right    Pain Descriptors / Indicators Sharp;Throbbing    Pain Type Chronic pain    Pain Onset More than a month ago     Pain Frequency Intermittent    Pain Score 7    Pain Location Finger (Comment which one)    Pain Orientation Right    Pain Descriptors / Indicators Numbness;Throbbing    Pain Type Chronic pain    Pain Onset More than a month ago    Pain Frequency Intermittent    Aggravating Factors  manipulating objects              [facility] PT Assessment - 07/30/20 0001      Assessment   Medical Diagnosis Neck pain with RUE pain    Referring Provider (PT) Girtha Hake MD    Onset Date/Surgical Date 04/20/20   January 2022   Hand Dominance Left    Prior Therapy Not for this problem     Balance Screen   Has the patient fallen in the past 6 months No      Prior Function   Level of Independence Independent    Vocation Full time employment   Patient is currently out of work due to condition   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      Cognition   Overall Cognitive Status Within Functional Limits for tasks assessed      Sensation   Light Touch Appears Intact, all UE dermatomes bilat     ROM / Strength   AROM / PROM / Strength PROM      AROM   Cervical Flexion 52    Cervical Extension 19 painful   Cervical - Right Side Bend 48 painful   Cervical - Left Side Bend 50 painful   Cervical - Right Rotation 46, painful    Cervical - Left Rotation 51, painful      Strength   Right Hand Gross Grasp 10kg   Left Hand Gross Grasp 10kg     Palpation   Spinal mobility CPA [place] Testing Cervical Spine    Palpation comment Cervical Musculature      Special Tests    Special Tests --   Hoffman's sign was conducted on 4/6 and resutls were -   Other special tests Finkelstein's test   + on R     Spurling's   Findings Positive    Side --   Both   Comment --   Patient was seen in [place] and has + B results for Spurling's         Assessment:  MMT  - B Upper traps: 5/5             - B Biceps: 5/5              - B Triceps: 5/5              - B  Wrist flexors: 5/5     Gait:  - R side shoulder elevation  - no arm swing, trunk movement - Head away from midline towards R side (very slight Rt rotation + Rt lateral flexion)  - normal heel strike, ROM/control  - >typical narrow stance in gait;  - no evidence gait ataxia  - no LOB, gait speed appropriate for distance ambulated   Passive Range of Motion:  - CPAs all grade 1, patient in prone position  - C6-T4: painful  - B UPAs grade 1, prone position  - C7: painful   Objective measurements completed on examination: See above findings.      PT Education - 07/30/20 1411    Education Details Patient was given information on things that will be addressed during next therapy session.    Person(s) Educated Patient    Methods Explanation    Comprehension Verbalized understanding            PT Short Term Goals - 07/30/20 1426      PT SHORT TERM GOAL #1   Title Patient will improve grip strength to 15 kg to be able to perform object manipulation ADL's more efficiently.    Baseline 07/30/20:10    Time 3    Period Weeks    Status New    Target Date 08/20/20      PT SHORT TERM GOAL #2   Title Patient will show indepedence and compliance with HEP to minimize pain and improve functional limiations.    Baseline 08/01/20: HEP given    Time 3    Period Weeks    Status New    Target Date 08/20/20             PT Long Term Goals - 07/30/20 1429      PT LONG TERM GOAL #1   Title Patient will improve FOTO or 63 score to increase functional capacity    Baseline 07/30/20: 56    Time 8    Period Weeks    Status New    Target Date 09/24/20      PT LONG TERM GOAL #2   Title Patient will have a decrease in R UE pain to 5/10 or less to be able to return to work and perform work tasks    Baseline 07/30/20:10/10    Time 8    Period Weeks    Status New    Target Date 09/24/20      PT LONG TERM GOAL #3   Title Patient will have at least 50 degrees of cervical extension AROM to increase mobility to  perform ADL's    Baseline 07/30/20:  19 degrees    Time 8    Period Weeks    Status New    Target Date 09/24/20      PT LONG TERM GOAL #4   Title --    Baseline --    Time --    Period --                  Plan - 07/30/20 1419    Clinical Impression Statement Patient demonstrates limited cervical AROM B rotation and cervical extension with increased pain with motion. Patient demonstrates limited B grip strength. Patient will benefit from skilled therapy to return to prior level of function.    Personal Factors and Comorbidities Time since onset of injury/illness/exacerbation;Age    Examination-Activity Limitations Lift;Carry;Other    Examination-Participation Restrictions Laundry;Cleaning;Occupation    Stability/Clinical Decision Making Stable/Uncomplicated  Clinical Decision Making Moderate    Rehab Potential Good    PT Frequency 2x / week    PT Duration 8 weeks    PT Treatment/Interventions Passive range of motion;Therapeutic activities;Therapeutic exercise;Patient/family education;Manual techniques    PT Next Visit Plan focus on joint mobilization and nerve tension testing    Consulted and Agree with Plan of Care Patient           Patient will benefit from skilled therapeutic intervention in order to improve the following deficits and impairments:  Impaired sensation,Pain,Decreased activity tolerance,Decreased range of motion,Impaired UE functional use  Visit Diagnosis: Radiculopathy, cervical region     Problem List Patient Active Problem List   Diagnosis Date Noted  . Migraine with aura and without status migrainosus, not intractable 04/06/2019  . Neuropathy 04/06/2019  . Cervical stenosis (uterine cervix) 03/31/2019  . Essential hypertension 04/22/2018  . Hypertriglyceridemia 04/22/2018  . History of chest pain 04/22/2018  . Tobacco abuse 04/22/2018    Jaynie Crumble, SPT 07/30/2020, 4:29 PM  [facility] [address] [place], [address] Phone: [phone]   Fax:  [phone]  Name: Robyn Anderson MRN: [id number] Date of Birth: 09/03/1965

## 2020-08-01 ENCOUNTER — Ambulatory Visit: Payer: Managed Care, Other (non HMO)

## 2020-08-01 ENCOUNTER — Other Ambulatory Visit: Payer: Self-pay

## 2020-08-01 DIAGNOSIS — M5412 Radiculopathy, cervical region: Secondary | ICD-10-CM | POA: Diagnosis not present

## 2020-08-01 DIAGNOSIS — M542 Cervicalgia: Secondary | ICD-10-CM

## 2020-08-01 NOTE — Therapy (Signed)
[facility] [address] [place], [address] Phone: [phone]   Fax:  [phone]  Physical Therapy Treatment  Patient Details  Name: Robyn Anderson MRN: [id number] Date of Birth: [birth year] Referring Provider (PT): Girtha Hake MD   Encounter Date: 08/01/2020   PT End of Session - 08/01/20 1632    Visit Number 2    Number of Visits 17    Date for PT Re-Evaluation 09/24/20    Authorization Type [employer]    Authorization Time Period 07/30/20 - 10/22/20    PT Start Time 1516    PT Stop Time 1558    PT Time Calculation (min) 42 min    Activity Tolerance Patient tolerated treatment well    Behavior During Therapy [facility] for tasks assessed/performed           Past Medical History:  Diagnosis Date  . Barrett esophagus   . Coronary artery disease   . GERD (gastroesophageal reflux disease)   . Headache    migraines 1-2/mo  . Hypertension   . Obesity (BMI 30-39.9)   . Postmenopausal bleeding 01/20/2019    Past Surgical History:  Procedure Laterality Date  . ATHERECTOMY Right    renal  . CARPAL TUNNEL RELEASE Left   . CARPAL TUNNEL RELEASE Right 09/27/2015   Procedure: OPEN CARPAL TUNNEL RELEASE RIGHT HAND;  Surgeon: Leanor Kail, MD;  Location: [place];  Service: Orthopedics;  Laterality: Right;  . CESAREAN SECTION     x2  . COLONOSCOPY    . ESOPHAGOGASTRODUODENOSCOPY    . HYSTEROSCOPY WITH D & C N/A 04/18/2019   Procedure: DILATATION AND CURETTAGE /HYSTEROSCOPY;  Surgeon: Aletha Halim, MD;  Location: [place];  Service: Gynecology;  Laterality: N/A;  . LAPAROSCOPIC SALPINGO OOPHERECTOMY Bilateral 04/18/2019   Procedure: LAPAROSCOPIC SALPINGO OOPHORECTOMY;  Surgeon: Aletha Halim, MD;  Location: [place];  Service: Gynecology;  Laterality: Bilateral;  . TUBAL LIGATION      There were no vitals filed for this visit.   Subjective Assessment -  08/01/20 1629    Subjective Patient reports current 6-7/10 throbbing pain in thumb/index fingers, after driving to therapy session. Patient states that she uses both hands on the wheel while driving and turning her head increases the pain.    Pertinent History Robyn Anderson is a pleasant 63yoF that was referred to OPPT by Dr. Alba Destine, MD for an evaluation of cervical neck pain with radicular symptoms. The onset of this condition was January 2022 with no specified mechanism of injury. Symptoms include sharp cervical neck/ RUE pain with numbness/throbbing in the thumb/index fingers. Patient had a MRI on neck 2/3 weeks ago and has an MRI scheduled for her back on April 16th.  Patient also reports chronic low back pain with R LE sciatica Patient has had surgery in B hands for carpal tunnel. Patient is L hand dominant and reports previous history of pain in L UE but does not bother as much as the R UE. 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 Patient states that symptoms are exuberated when clicking a computer mouse at work with R hand. Patient was taken out of work on March 5TH due to condition. Patient would like to return to work with decreased pain.    Limitations Lifting;House hold activities    Patient Stated Goals To return to work    Currently in Pain? Yes  Pain Score 6     Pain Location Hand    Pain Orientation Right    Pain Descriptors / Indicators Throbbing    Pain Type Chronic pain    Pain Onset More than a month ago    Pain Onset More than a month ago    Pain Onset More than a month ago           Assessment  Median Nerve Tension Screening *patient is seated  -Left and Right symptomatic pain, does improve with head approximation   Posture - Rounded shoulder  - Normal kyphosis in mid thoracic, with moderate hyperkyphosis in lower T-spine, mild hyperkyphosis - Protracted and depressed scapular position  - Slight  forward head posture: Mastoid process 2.5-3" anterior to the [employer] in Supine - Patient did not perform Supine Cervical Flexion when cued, but only capital flexion or slight chin tuck; limited by pain  o Compensated with chin tucks  - PROM was limited on the R (~25% less than Left side, painful at end range)  o Pt loses flexion moving towards end range    TREATMENT  Therapeutic Exercise  -B Median nerve glides in sitting 2 x 10  (RUE in 90 shoulder abduction, full elbow extension, alternating wrist extension/flexion; paired with head movement)  -Scapular retraction pinches in sitting 3 x 10  -Cervical extension A/ROM x10   Manual Techniques (10 mins) -Cervical retraction with towel 2 x 10 with 5 second holds  -Suboccipital release 2 x 20 with 3 sec holds   Performed to address UE pain      PT Education - 08/01/20 1630    Education Details HEP: scapular retractions, cervical extension, median nerve glides. Form/technique with exercise    Person(s) Educated Patient    Methods Explanation;Demonstration;Verbal cues    Comprehension Verbalized understanding            PT Short Term Goals - 07/30/20 1426      PT SHORT TERM GOAL #1   Title Patient will improve grip strength to 15 kg to be able to perform object manipulation ADL's more efficiently.    Baseline 07/30/20:10    Time 3    Period Weeks    Status New    Target Date 08/20/20      PT SHORT TERM GOAL #2   Title Patient will show indepedence and compliance with HEP to minimize pain and improve functional limiations.    Baseline 08/01/20: HEP given    Time 3    Period Weeks    Status New    Target Date 08/20/20             PT Long Term Goals - 07/30/20 1429      PT LONG TERM GOAL #1   Title Patient will improve FOTO or 63 score to increase functional capacity    Baseline 07/30/20: 56    Time 8    Period Weeks    Status New    Target Date 09/24/20      PT LONG TERM GOAL #2   Title Patient  will have a decrease in R UE pain to 5/10 or less to be able to return to work and perform work tasks    Baseline 07/30/20:10/10    Time 8    Period Weeks    Status New    Target Date 09/24/20      PT LONG TERM GOAL #3   Title Patient will have at least 50 degrees of  cervical extension AROM to increase mobility to perform ADL's    Baseline 07/30/20:  19 degrees    Time 8    Period Weeks    Status New    Target Date 09/24/20      PT LONG TERM GOAL #4   Title --    Baseline --    Time --    Period --                 Plan - 08/01/20 1633    Clinical Impression Statement Patient continues to demonstrate limited cervical B AROM and cervical extension. Patient demonstrates forward head, rounded shoulder, kyphotic resting posture in standing and sitting. Patient was able to tolerate suboccipital releases and repetitive cervical extension exercises which centralized symptoms. Patient will further benefit from skilled therapy to return to prior level of function.    Personal Factors and Comorbidities Time since onset of injury/illness/exacerbation;Age    Examination-Activity Limitations Lift;Carry;Other    Examination-Participation Restrictions Laundry;Cleaning;Occupation    Stability/Clinical Decision Making Stable/Uncomplicated    Rehab Potential Good    PT Frequency 2x / week    PT Duration 8 weeks    PT Treatment/Interventions Passive range of motion;Therapeutic activities;Therapeutic exercise;Patient/family education;Manual techniques    PT Next Visit Plan focus on joint mobilization and nerve tension testing    Consulted and Agree with Plan of Care Patient           Patient will benefit from skilled therapeutic intervention in order to improve the following deficits and impairments:  Impaired sensation,Pain,Decreased activity tolerance,Decreased range of motion,Impaired UE functional use  Visit Diagnosis: Radiculopathy, cervical region  Cervicalgia     Problem  List Patient Active Problem List   Diagnosis Date Noted  . Migraine with aura and without status migrainosus, not intractable 04/06/2019  . Neuropathy 04/06/2019  . Cervical stenosis (uterine cervix) 03/31/2019  . Essential hypertension 04/22/2018  . Hypertriglyceridemia 04/22/2018  . History of chest pain 04/22/2018  . Tobacco abuse 04/22/2018    Jaynie Crumble, SPT 08/01/2020, 4:53 PM  [facility] [address], [place], [address] Phone: [phone]   Fax:  [phone]  Name: Robyn Anderson MRN: [id number] Date of Birth: [birth year]

## 2020-08-03 ENCOUNTER — Other Ambulatory Visit: Payer: Self-pay

## 2020-08-03 ENCOUNTER — Ambulatory Visit
Admission: RE | Admit: 2020-08-03 | Discharge: 2020-08-03 | Disposition: A | Discharge status: Home / Self Care | Payer: Managed Care, Other (non HMO) | Source: Ambulatory Visit | Attending: Physical Medicine & Rehabilitation | Admitting: Physical Medicine & Rehabilitation

## 2020-08-03 DIAGNOSIS — M5441 Lumbago with sciatica, right side: Secondary | ICD-10-CM | POA: Diagnosis not present

## 2020-08-03 DIAGNOSIS — G8929 Other chronic pain: Secondary | ICD-10-CM | POA: Diagnosis present

## 2020-08-06 ENCOUNTER — Ambulatory Visit: Payer: Managed Care, Other (non HMO)

## 2020-08-06 ENCOUNTER — Other Ambulatory Visit: Payer: Self-pay

## 2020-08-06 DIAGNOSIS — M5412 Radiculopathy, cervical region: Secondary | ICD-10-CM | POA: Diagnosis not present

## 2020-08-06 DIAGNOSIS — M542 Cervicalgia: Secondary | ICD-10-CM

## 2020-08-06 NOTE — Therapy (Signed)
[facility] [address] [place], [address] Phone: [phone]   Fax:  [phone]  Physical Therapy Treatment  Patient Details  Name: Robyn Anderson MRN: [id number] Date of Birth: Apr 01, 1966 Referring Provider (PT): Girtha Hake MD   Encounter Date: 08/06/2020   PT End of Session - 08/06/20 1522    Visit Number 3    Number of Visits 17    Date for PT Re-Evaluation 09/24/20    Authorization Type [employer]    Authorization Time Period 07/30/20 - 10/22/20    PT Start Time 1345    PT Stop Time 1430    PT Time Calculation (min) 45 min    Activity Tolerance Patient tolerated treatment well    Behavior During Therapy [facility] for tasks assessed/performed           Past Medical History:  Diagnosis Date  . Barrett esophagus   . Coronary artery disease   . GERD (gastroesophageal reflux disease)   . Headache    migraines 1-2/mo  . Hypertension   . Obesity (BMI 30-39.9)   . Postmenopausal bleeding 01/20/2019    Past Surgical History:  Procedure Laterality Date  . ATHERECTOMY Right    renal  . CARPAL TUNNEL RELEASE Left   . CARPAL TUNNEL RELEASE Right 09/27/2015   Procedure: OPEN CARPAL TUNNEL RELEASE RIGHT HAND;  Surgeon: Leanor Kail, MD;  Location: [place];  Service: Orthopedics;  Laterality: Right;  . CESAREAN SECTION     x2  . COLONOSCOPY    . ESOPHAGOGASTRODUODENOSCOPY    . HYSTEROSCOPY WITH D & C N/A 04/18/2019   Procedure: DILATATION AND CURETTAGE /HYSTEROSCOPY;  Surgeon: Aletha Halim, MD;  Location: [place];  Service: Gynecology;  Laterality: N/A;  . LAPAROSCOPIC SALPINGO OOPHERECTOMY Bilateral 04/18/2019   Procedure: LAPAROSCOPIC SALPINGO OOPHORECTOMY;  Surgeon: Aletha Halim, MD;  Location: [place];  Service: Gynecology;  Laterality: Bilateral;  . TUBAL LIGATION      There were no vitals filed for this visit.   Subjective Assessment -  08/06/20 1510    Subjective Patient states that she has increased throbbing R UE pain from driving to therapy session. Patient reports that she is getting a steriod injection on 4/28 and will not be able to do therapy for a week afterwards.    Pertinent History Robyn Anderson is a pleasant 76yoF that was referred to OPPT by Dr. Alba Destine, MD for an evaluation of cervical neck pain with radicular symptoms. The onset of this condition was January 2022 with no specified mechanism of injury. Symptoms include sharp cervical neck/ RUE pain with numbness/throbbing in the thumb/index fingers. Patient had a MRI on neck 2/3 weeks ago and has an MRI scheduled for her back on April 16th.  Patient also reports chronic low back pain with R LE sciatica Patient has had surgery in B hands for carpal tunnel. Patient is L hand dominant and reports previous history of pain in L UE but does not bother as much as the R UE. 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 Patient states that symptoms are exuberated when clicking a computer mouse at work with R hand. Patient was taken out of work on March 5TH due to condition. Patient would like to return to work with decreased pain.    Limitations Lifting;House hold activities    Diagnostic tests MRI spine November 2021, pt was told to have stenosis  in low back as well as arthritis.    Patient Stated Goals To return to work    Currently in Pain? Yes    Pain Score 9     Pain Location Hand    Pain Orientation Right    Pain Descriptors / Indicators Throbbing    Pain Type Chronic pain    Pain Onset More than a month ago            TREATMENT  Therapeutic Exercise  -B Median nerve glides in sitting 2 x 10  (RUE in 90 shoulder abduction, full elbow extension, alternating wrist extension/flexion; paired with head movement)   - verbal cueing to incorporate cervical SB instead of rotational head movements  -Scapular  retraction pinches in sitting 2 x 10  -Cervical extension A/ROM x10. Pt reports increase in RUE symptoms and neck pain after.  -Cervical flexion A/ROM x 10 performed to improve spacing between cervical facets. Pt reports decrease in symptoms caused from repeated extensions.  - B Radial nerve glide with ipsi Cervical SB x 10  - UE shoulder depression, shoulder IR, full elbow extension/pronation, wrist flexion  - verbal cueing and demonstration given to perform exercise. Reports reduction in "throbbing" and N/t with glides.   -Cervical traction with towel 2 x 10 with 15 second holds  -Suboccipital release 2 x 20 with 3 sec holds  - Cervical retraction in sitting 2 x 10. Min VC for form/technique.      Performed to address UE pain. Reports of decreased pain from 9/10 NPS to 4/10 NPS in neck and RUE.      PT Education - 08/06/20 1521    Education Details form/technique with exercise    Person(s) Educated Patient    Methods Explanation;Demonstration    Comprehension Returned demonstration;Verbalized understanding            PT Short Term Goals - 07/30/20 1426      PT SHORT TERM GOAL #1   Title Patient will improve grip strength to 15 kg to be able to perform object manipulation ADL's more efficiently.    Baseline 07/30/20:10    Time 3    Period Weeks    Status New    Target Date 08/20/20      PT SHORT TERM GOAL #2   Title Patient will show indepedence and compliance with HEP to minimize pain and improve functional limiations.    Baseline 08/01/20: HEP given    Time 3    Period Weeks    Status New    Target Date 08/20/20             PT Long Term Goals - 07/30/20 1429      PT LONG TERM GOAL #1   Title Patient will improve FOTO or 63 score to increase functional capacity    Baseline 07/30/20: 56    Time 8    Period Weeks    Status New    Target Date 09/24/20      PT LONG TERM GOAL #2   Title Patient will have a decrease in R UE pain to 5/10 or less to be able to  return to work and perform work tasks    Baseline 07/30/20:10/10    Time 8    Period Weeks    Status New    Target Date 09/24/20      PT LONG TERM GOAL #3   Title Patient will have at least 50 degrees of cervical extension AROM to increase mobility  to perform ADL's    Baseline 07/30/20:  19 degrees    Time 8    Period Weeks    Status New    Target Date 09/24/20      PT LONG TERM GOAL #4   Title --    Baseline --    Time --    Period --                 Plan - 08/06/20 1606    Clinical Impression Statement Patient demonstrates some improvement with cervical extension AROM however, patient is limited by pain. Patient still has difficulty with B cervical rotation movements. Patient demonstrates a decrease in symptom reproduction while performing repeated cervical flexion and radial nerve glide exercises on BUE's. Pt initialt reported RUE and neck pain at 9/10 NPS prior to treatment and after session pt reported a decrease in pain to 4/10 NPS in neck and RUE pain. Patient will benefit from skilled therapy to return to prior level of function.    Personal Factors and Comorbidities Time since onset of injury/illness/exacerbation;Age    Examination-Activity Limitations Lift;Carry;Other    Examination-Participation Restrictions Laundry;Cleaning;Occupation    Stability/Clinical Decision Making Stable/Uncomplicated    Rehab Potential Good    PT Frequency 2x / week    PT Duration 8 weeks    PT Treatment/Interventions Passive range of motion;Therapeutic activities;Therapeutic exercise;Patient/family education;Manual techniques    PT Next Visit Plan focus on joint mobilization and nerve tension testing    Consulted and Agree with Plan of Care Patient           Patient will benefit from skilled therapeutic intervention in order to improve the following deficits and impairments:  Impaired sensation,Pain,Decreased activity tolerance,Decreased range of motion,Impaired UE functional  use  Visit Diagnosis: Radiculopathy, cervical region  Cervicalgia     Problem List Patient Active Problem List   Diagnosis Date Noted  . Migraine with aura and without status migrainosus, not intractable 04/06/2019  . Neuropathy 04/06/2019  . Cervical stenosis (uterine cervix) 03/31/2019  . Essential hypertension 04/22/2018  . Hypertriglyceridemia 04/22/2018  . History of chest pain 04/22/2018  . Tobacco abuse 04/22/2018    Salem Caster. Fairly IV, PT, DPT Physical Therapist- [facility]  08/06/2020, 4:26 PM  [place] [facility] [address] [place], [address] Phone: [phone]   Fax:  [phone]  Name: IMAJEAN MCDERMID MRN: [id number] Date of Birth: 1965/06/08

## 2020-08-13 ENCOUNTER — Ambulatory Visit: Payer: Managed Care, Other (non HMO)

## 2020-08-13 ENCOUNTER — Other Ambulatory Visit: Payer: Self-pay

## 2020-08-13 DIAGNOSIS — M5412 Radiculopathy, cervical region: Secondary | ICD-10-CM | POA: Diagnosis not present

## 2020-08-13 DIAGNOSIS — M542 Cervicalgia: Secondary | ICD-10-CM

## 2020-08-13 NOTE — Therapy (Signed)
[place] [facility] [address], [place], [address] Phone: [phone]   Fax:  [phone]  Physical Therapy Treatment  Patient Details  Name: Robyn Anderson MRN: [id number] Date of Birth: Mar 10, 1966 Referring Provider (PT): Girtha Hake MD   Encounter Date: 08/13/2020   PT End of Session - 08/13/20 1445    Visit Number 4    Number of Visits 17    Date for PT Re-Evaluation 09/24/20    Authorization Type [employer]    Authorization Time Period 07/30/20 - 10/22/20    PT Start Time 1117    PT Stop Time 1200    PT Time Calculation (min) 43 min    Activity Tolerance Patient tolerated treatment well    Behavior During Therapy [facility] for tasks assessed/performed           Past Medical History:  Diagnosis Date  . Barrett esophagus   . Coronary artery disease   . GERD (gastroesophageal reflux disease)   . Headache    migraines 1-2/mo  . Hypertension   . Obesity (BMI 30-39.9)   . Postmenopausal bleeding 01/20/2019    Past Surgical History:  Procedure Laterality Date  . ATHERECTOMY Right    renal  . CARPAL TUNNEL RELEASE Left   . CARPAL TUNNEL RELEASE Right 09/27/2015   Procedure: OPEN CARPAL TUNNEL RELEASE RIGHT HAND;  Surgeon: Leanor Kail, MD;  Location: [place];  Service: Orthopedics;  Laterality: Right;  . CESAREAN SECTION     x2  . COLONOSCOPY    . ESOPHAGOGASTRODUODENOSCOPY    . HYSTEROSCOPY WITH D & C N/A 04/18/2019   Procedure: DILATATION AND CURETTAGE /HYSTEROSCOPY;  Surgeon: Aletha Halim, MD;  Location: [place];  Service: Gynecology;  Laterality: N/A;  . LAPAROSCOPIC SALPINGO OOPHERECTOMY Bilateral 04/18/2019   Procedure: LAPAROSCOPIC SALPINGO OOPHORECTOMY;  Surgeon: Aletha Halim, MD;  Location: [place];  Service: Gynecology;  Laterality: Bilateral;  . TUBAL LIGATION      There were no vitals filed for this visit.   Subjective Assessment -  08/13/20 1434    Subjective Patient states that she has current neck pain but decreased throbbing in thumb/index fingers. Patient reports that she has been doing repeated cervical flexion exercises 5/6 times a day when she has increased symptom reproduction and the exercise gives her significant pain relief.    Pertinent History Robyn Anderson is a pleasant 29yoF that was referred to OPPT by Dr. Alba Destine, MD for an evaluation of cervical neck pain with radicular symptoms. The onset of this condition was January 2022 with no specified mechanism of injury. Symptoms include sharp cervical neck/ RUE pain with numbness/throbbing in the thumb/index fingers. Patient had a MRI on neck 2/3 weeks ago and has an MRI scheduled for her back on April 16th.  Patient also reports chronic low back pain with R LE sciatica Patient has had surgery in B hands for carpal tunnel. Patient is L hand dominant and reports previous history of pain in L UE but does not bother as much as the R UE. 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 Patient states that symptoms are exuberated when clicking a computer mouse at work with R hand. Patient was taken out of work on March 5TH due to condition. Patient would like to return to work with decreased pain.    Limitations Lifting;House hold activities    Diagnostic tests MRI spine November 2021, pt  was told to have stenosis in low back as well as arthritis.    Patient Stated Goals To return to work    Currently in Pain? Yes    Pain Score 8     Pain Location Neck    Pain Orientation Left;Right    Pain Descriptors / Indicators Aching    Pain Type Chronic pain    Pain Onset More than a month ago    Pain Onset More than a month ago    Pain Onset More than a month ago              TREATMENT  Therapeutic Exercise  -B Median nerve glides in sitting x 10  (RUE in 90 shoulder abduction, full elbow extension and supination,  alternating wrist extension/flexion; paired with head side bending movement)             - verbal cueing to incorporate cervical SB instead of rotational head movements with good carryover after cues. -Cervical extension self SNAGs with towel in sitting 2 x10. Reproduction of N/T in median nerve distribution on RUE. VC's to decrease extension ROM with elimination of N/T after cues.  -B Cervical rotation self SNAGs with towel in sitting 2 x 10 .  - B Radial nerve glide with ipsi Cervical SB x 20             - UE shoulder depression, shoulder IR, full elbow extension/pronation, wrist flexion             - verbal and tactile cueing and demonstration given to perform exercise. Reports reduction in "throbbing" and N/t with glides.   Performed to address UE pain     Manual Therapy (10 min)  -Suboccipital release 2 x 20 with 3 sec holds   - patient in supine  -Grade 1 CPAs C3-T2  x  - patient in prone    Pt pain from 7/10 NPS prior to treatment to 4/10 NPS post treatment. Pt educated on exercises to perform at home to improve cervical and nerve mobility.    PT Education - 08/13/20 1444    Education Details form/technique with exercise, EGB:[id number] extension and rotation self SNAGs with towel    Person(s) Educated Patient    Methods Demonstration;Explanation    Comprehension Verbalized understanding;Returned demonstration;Verbal cues required;Tactile cues required            PT Short Term Goals - 07/30/20 1426      PT SHORT TERM GOAL #1   Title Patient will improve grip strength to 15 kg to be able to perform object manipulation ADL's more efficiently.    Baseline 07/30/20:10    Time 3    Period Weeks    Status New    Target Date 08/20/20      PT SHORT TERM GOAL #2   Title Patient will show indepedence and compliance with HEP to minimize pain and improve functional limiations.    Baseline 08/01/20: HEP given    Time 3    Period Weeks    Status New    Target Date 08/20/20              PT Long Term Goals - 07/30/20 1429      PT LONG TERM GOAL #1   Title Patient will improve FOTO or 63 score to increase functional capacity    Baseline 07/30/20: 56    Time 8    Period Weeks    Status New    Target Date 09/24/20  PT LONG TERM GOAL #2   Title Patient will have a decrease in R UE pain to 5/10 or less to be able to return to work and perform work tasks    Baseline 07/30/20:10/10    Time 8    Period Weeks    Status New    Target Date 09/24/20      PT LONG TERM GOAL #3   Title Patient will have at least 50 degrees of cervical extension AROM to increase mobility to perform ADL's    Baseline 07/30/20:  19 degrees    Time 8    Period Weeks    Status New    Target Date 09/24/20      PT LONG TERM GOAL #4   Title --    Baseline --    Time --    Period --                 Plan - 08/13/20 1446    Clinical Impression Statement Patient continues to show improvement with cervical extension and rotational movements with the ability to tolerate self SNAG exercises with towel not being as limited by increased pain. Reported N/T in median nerve distribution with repeated extension but after verbal cuing, pt able to extend through les ROM with no N/T after cues. Patient has some C-spine hypomobility and pain that improved with Grade 1 CPAs in the C3-T2 region. Patient demonstrates a decrease in symptom reproduction with suboccipital releases. From 7/10 NPS to 4/10 NPS after session. Patient will further benefit from skilled therapy to improve strength and mobility to return to prior level of function.    Personal Factors and Comorbidities Time since onset of injury/illness/exacerbation;Age    Examination-Activity Limitations Lift;Carry;Other    Examination-Participation Restrictions Laundry;Cleaning;Occupation    Stability/Clinical Decision Making Stable/Uncomplicated    Rehab Potential Good    PT Frequency 2x / week    PT Duration 8 weeks    PT  Treatment/Interventions Passive range of motion;Therapeutic activities;Therapeutic exercise;Patient/family education;Manual techniques    PT Next Visit Plan focus on joint mobilization and nerve tension testing    Consulted and Agree with Plan of Care Patient           Patient will benefit from skilled therapeutic intervention in order to improve the following deficits and impairments:  Impaired sensation,Pain,Decreased activity tolerance,Decreased range of motion,Impaired UE functional use  Visit Diagnosis: Radiculopathy, cervical region  Cervicalgia     Problem List Patient Active Problem List   Diagnosis Date Noted  . Migraine with aura and without status migrainosus, not intractable 04/06/2019  . Neuropathy 04/06/2019  . Cervical stenosis (uterine cervix) 03/31/2019  . Essential hypertension 04/22/2018  . Hypertriglyceridemia 04/22/2018  . History of chest pain 04/22/2018  . Tobacco abuse 04/22/2018    Salem Caster. Fairly IV, PT, DPT Physical Therapist- [facility]  08/13/2020, 3:27 PM  [place] [address], [place], [address] Phone: [phone]   Fax:  [phone]  Name: LAYKEN BEG MRN: [id number] Date of Birth: 02-Oct-1965

## 2020-08-15 ENCOUNTER — Ambulatory Visit: Payer: Managed Care, Other (non HMO)

## 2020-08-19 ENCOUNTER — Ambulatory Visit: Payer: Managed Care, Other (non HMO)

## 2020-08-22 ENCOUNTER — Other Ambulatory Visit: Payer: Self-pay

## 2020-08-22 ENCOUNTER — Ambulatory Visit: Payer: Managed Care, Other (non HMO) | Attending: Physical Medicine & Rehabilitation

## 2020-08-22 DIAGNOSIS — M5412 Radiculopathy, cervical region: Secondary | ICD-10-CM | POA: Insufficient documentation

## 2020-08-22 DIAGNOSIS — M542 Cervicalgia: Secondary | ICD-10-CM | POA: Insufficient documentation

## 2020-08-22 NOTE — Therapy (Signed)
[place] [facility] [address] [place], [address] Phone: [phone]   Fax:  [phone]  Physical Therapy Treatment  Patient Details  Name: Robyn Anderson MRN: [id number] Date of Birth: 05/04/1965 Referring Provider (PT): Girtha Hake MD   Encounter Date: 08/22/2020   PT End of Session - 08/22/20 1421    Visit Number 5    Number of Visits 17    Date for PT Re-Evaluation 09/24/20    Authorization Type [employer]    Authorization Time Period 07/30/20 - 10/22/20    PT Start Time 1118    PT Stop Time 1200    PT Time Calculation (min) 42 min    Activity Tolerance Patient tolerated treatment well    Behavior During Therapy [facility] for tasks assessed/performed           Past Medical History:  Diagnosis Date  . Barrett esophagus   . Coronary artery disease   . GERD (gastroesophageal reflux disease)   . Headache    migraines 1-2/mo  . Hypertension   . Obesity (BMI 30-39.9)   . Postmenopausal bleeding 01/20/2019    Past Surgical History:  Procedure Laterality Date  . ATHERECTOMY Right    renal  . CARPAL TUNNEL RELEASE Left   . CARPAL TUNNEL RELEASE Right 09/27/2015   Procedure: OPEN CARPAL TUNNEL RELEASE RIGHT HAND;  Surgeon: Leanor Kail, MD;  Location: [place];  Service: Orthopedics;  Laterality: Right;  . CESAREAN SECTION     x2  . COLONOSCOPY    . ESOPHAGOGASTRODUODENOSCOPY    . HYSTEROSCOPY WITH D & C N/A 04/18/2019   Procedure: DILATATION AND CURETTAGE /HYSTEROSCOPY;  Surgeon: Aletha Halim, MD;  Location: [place];  Service: Gynecology;  Laterality: N/A;  . LAPAROSCOPIC SALPINGO OOPHERECTOMY Bilateral 04/18/2019   Procedure: LAPAROSCOPIC SALPINGO OOPHORECTOMY;  Surgeon: Aletha Halim, MD;  Location: [place];  Service: Gynecology;  Laterality: Bilateral;  . TUBAL LIGATION      There were no vitals filed for this visit.   Subjective Assessment -  08/22/20 1400    Subjective Patient reports that she recently has had increased L arm pain since Monday, that has gotten worse than her R arm pain. Patient states that she received a steroid injection last week that has helped with her pain.    Pertinent History Robyn Anderson is a pleasant 59yoF that was referred to OPPT by Dr. Alba Destine, MD for an evaluation of cervical neck pain with radicular symptoms. The onset of this condition was January 2022 with no specified mechanism of injury. Symptoms include sharp cervical neck/ RUE pain with numbness/throbbing in the thumb/index fingers. Patient had a MRI on neck 2/3 weeks ago and has an MRI scheduled for her back on April 16th.  Patient also reports chronic low back pain with R LE sciatica Patient has had surgery in B hands for carpal tunnel. Patient is L hand dominant and reports previous history of pain in L UE but does not bother as much as the R UE. 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 Patient states that symptoms are exuberated when clicking a computer mouse at work with R hand. Patient was taken out of work on March 5TH due to condition. Patient would like to return to work with decreased pain.    Limitations Lifting;House hold activities    Diagnostic tests MRI spine November 2021, pt was told to have stenosis  in low back as well as arthritis.    Patient Stated Goals To return to work    Currently in Pain? Yes    Pain Score 8     Pain Location Arm    Pain Orientation Left    Pain Descriptors / Indicators Sharp    Pain Type Chronic pain    Pain Onset More than a month ago    Pain Score 5    Pain Location Arm    Pain Orientation Right    Pain Descriptors / Indicators Sharp    Pain Type Chronic pain    Pain Onset More than a month ago    Pain Onset More than a month ago            TREATMENT  Therapeutic Exercise  -B Median nerve glides in sitting x 10  (RUE in 90  shoulder abduction, full elbow extension and supination, alternating wrist extension/flexion; paired with head side bending movement)             - verbal cueing to incorporate cervical SB instead of rotational head movements with good carryover after cues. -B Cervical rotation self SNAGs with towel in sitting  x 10 .  - B Radial nerve glide with ipsi Cervical SB x 20             - UE shoulder depression, shoulder IR, full elbow extension/pronation, wrist flexion             - verbal and tactile cueing and demonstration given to perform exercise. Reports reduction in "throbbing" and N/t with glides.  Seated chin tucks 2 x 10  Standing Cervical flexion isometrics with towel on wall 2 x 15 w/3secH Standing cervical extension isometrics with towel on wall 2 x 15 w/3secH  Seated scapular retractions w/RTB 3 x 10     Performed to address BUE radicular pain      PT Education - 08/22/20 1420    Education Details form/technique with exercise    Person(s) Educated Patient    Methods Demonstration;Explanation    Comprehension Verbalized understanding;Returned demonstration;Verbal cues required;Tactile cues required            PT Short Term Goals - 07/30/20 1426      PT SHORT TERM GOAL #1   Title Patient will improve grip strength to 15 kg to be able to perform object manipulation ADL's more efficiently.    Baseline 07/30/20:10    Time 3    Period Weeks    Status New    Target Date 08/20/20      PT SHORT TERM GOAL #2   Title Patient will show indepedence and compliance with HEP to minimize pain and improve functional limiations.    Baseline 08/01/20: HEP given    Time 3    Period Weeks    Status New    Target Date 08/20/20             PT Long Term Goals - 07/30/20 1429      PT LONG TERM GOAL #1   Title Patient will improve FOTO or 63 score to increase functional capacity    Baseline 07/30/20: 56    Time 8    Period Weeks    Status New    Target Date 09/24/20      PT LONG  TERM GOAL #2   Title Patient will have a decrease in R UE pain to 5/10 or less to be able to return to work and  perform work tasks    Baseline 07/30/20:10/10    Time 8    Period Weeks    Status New    Target Date 09/24/20      PT LONG TERM GOAL #3   Title Patient will have at least 50 degrees of cervical extension AROM to increase mobility to perform ADL's    Baseline 07/30/20:  19 degrees    Time 8    Period Weeks    Status New    Target Date 09/24/20      PT LONG TERM GOAL #4   Title --    Baseline --    Time --    Period --                 Plan - 08/22/20 1422    Clinical Impression Statement Patient continues to show improvements with ability tolerate median/radial nerve glides. Patient demonstrates a significant decrease in pain while performing cervical rotation SNAGs with a towel. However, patient had some increased difficulty with cervical extension isometric exercises reporting increased symptom reproduction. Patient will further benefit from skilled therapy services to return to prior level of function.    Personal Factors and Comorbidities Time since onset of injury/illness/exacerbation;Age    Examination-Activity Limitations Lift;Carry;Other    Examination-Participation Restrictions Laundry;Cleaning;Occupation    Stability/Clinical Decision Making Stable/Uncomplicated    Rehab Potential Good    PT Frequency 2x / week    PT Duration 8 weeks    PT Treatment/Interventions Passive range of motion;Therapeutic activities;Therapeutic exercise;Patient/family education;Manual techniques    PT Next Visit Plan focus on joint mobilization and nerve tension testing    Consulted and Agree with Plan of Care Patient           Patient will benefit from skilled therapeutic intervention in order to improve the following deficits and impairments:  Impaired sensation,Pain,Decreased activity tolerance,Decreased range of motion,Impaired UE functional use  Visit  Diagnosis: Cervicalgia  Radiculopathy, cervical region     Problem List Patient Active Problem List   Diagnosis Date Noted  . Migraine with aura and without status migrainosus, not intractable 04/06/2019  . Neuropathy 04/06/2019  . Cervical stenosis (uterine cervix) 03/31/2019  . Essential hypertension 04/22/2018  . Hypertriglyceridemia 04/22/2018  . History of chest pain 04/22/2018  . Tobacco abuse 04/22/2018    Salem Caster. Fairly IV, PT, DPT Physical Therapist- [facility]  08/22/2020, 5:06 PM  [place] [facility] [address] [place], [address] Phone: [phone]   Fax:  [phone]  Name: STASHIA SIA MRN: [id number] Date of Birth: 1965-11-19

## 2020-08-26 ENCOUNTER — Ambulatory Visit: Payer: Managed Care, Other (non HMO)

## 2020-08-29 ENCOUNTER — Ambulatory Visit: Payer: Managed Care, Other (non HMO)

## 2020-08-29 ENCOUNTER — Other Ambulatory Visit: Payer: Self-pay

## 2020-08-29 DIAGNOSIS — M542 Cervicalgia: Secondary | ICD-10-CM

## 2020-08-29 DIAGNOSIS — M5412 Radiculopathy, cervical region: Secondary | ICD-10-CM

## 2020-08-29 NOTE — Therapy (Signed)
[place] [facility] [address] [place], [address] Phone: [phone]   Fax:  [phone]  Physical Therapy Treatment  Patient Details  Name: Robyn Anderson MRN: [id number] Date of Birth: [birth year] Referring Provider (PT): Girtha Hake MD   Encounter Date: 08/29/2020   PT End of Session - 08/29/20 1143    Visit Number 6    Number of Visits 17    Date for PT Re-Evaluation 09/24/20    Authorization Type [employer]    Authorization Time Period 07/30/20 - 10/22/20    PT Start Time 1108    PT Stop Time 1149    PT Time Calculation (min) 41 min    Activity Tolerance Patient tolerated treatment well    Behavior During Therapy [facility] for tasks assessed/performed           Past Medical History:  Diagnosis Date  . Barrett esophagus   . Coronary artery disease   . GERD (gastroesophageal reflux disease)   . Headache    migraines 1-2/mo  . Hypertension   . Obesity (BMI 30-39.9)   . Postmenopausal bleeding 01/20/2019    Past Surgical History:  Procedure Laterality Date  . ATHERECTOMY Right    renal  . CARPAL TUNNEL RELEASE Left   . CARPAL TUNNEL RELEASE Right 09/27/2015   Procedure: OPEN CARPAL TUNNEL RELEASE RIGHT HAND;  Surgeon: Leanor Kail, MD;  Location: [place];  Service: Orthopedics;  Laterality: Right;  . CESAREAN SECTION     x2  . COLONOSCOPY    . ESOPHAGOGASTRODUODENOSCOPY    . HYSTEROSCOPY WITH D & C N/A 04/18/2019   Procedure: DILATATION AND CURETTAGE /HYSTEROSCOPY;  Surgeon: Aletha Halim, MD;  Location: [place];  Service: Gynecology;  Laterality: N/A;  . LAPAROSCOPIC SALPINGO OOPHERECTOMY Bilateral 04/18/2019   Procedure: LAPAROSCOPIC SALPINGO OOPHORECTOMY;  Surgeon: Aletha Halim, MD;  Location: [place];  Service: Gynecology;  Laterality: Bilateral;  . TUBAL LIGATION      There were no vitals filed for this visit.   Subjective Assessment -  08/29/20 1108    Subjective Pt reports getting nerve conduction study this past Tuesday. Current pain in RUE is 2/10 and LUE  4-5/10.    Pertinent History Robyn Anderson is a pleasant 45yoF that was referred to OPPT by Dr. Alba Destine, MD for an evaluation of cervical neck pain with radicular symptoms. The onset of this condition was January 2022 with no specified mechanism of injury. Symptoms include sharp cervical neck/ RUE pain with numbness/throbbing in the thumb/index fingers. Patient had a MRI on neck 2/3 weeks ago and has an MRI scheduled for her back on April 16th.  Patient also reports chronic low back pain with R LE sciatica Patient has had surgery in B hands for carpal tunnel. Patient is L hand dominant and reports previous history of pain in L UE but does not bother as much as the R UE. 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 Patient states that symptoms are exuberated when clicking a computer mouse at work with R hand. Patient was taken out of work on March 5TH due to condition. Patient would like to return to work with decreased pain.    Limitations Lifting;House hold activities    Diagnostic tests MRI spine November 2021, pt was told to have stenosis in low back as well as arthritis.    Patient Stated Goals To return to work  Currently in Pain? Yes    Pain Onset More than a month ago    Pain Onset More than a month ago    Pain Onset More than a month ago          TREATMENT  Therapeutic Exercise  -B Median nerve glides in sitting x 10  (RUE in 90 shoulder abduction, full elbow extension and supination, alternating wrist extension/flexion; paired with head side bending movement)             - verbal cuing and tactile cuing to incorporate cervical SB instead of rotational head movements with good carryover after cues. -B Cervical rotation self SNAGs with towel in sitting  x 10 R and x10 L . Mod VC's TC's for  form/technique. Good carryover after cuing.   - B Radial nerve glide in sitting with ipsi Cervical SB x 20             - UE shoulder depression, shoulder IR, full elbow extension/pronation, wrist flexion             - verbal and tactile cueing and demonstration given to perform exercise. Reports reduction in "throbbing" and N/t with glides in median and radial distribution. Pt continues to require initial Mod VC and TC's for sequencing. Education provided on importance of correct form/technique to optimize benefits.   Standing Cervical flexion isometrics with towel on wall 2 x 15 w/3secH   Standing cervical retractions with towel on wall 2 x 15 w/3secH    Seated scapular retractions progressed to GTB 3 x 10   Education provided on ergonomic desk set up since pt is returning to work this evening. Education on appropriate computer monitor height for neutral cervical spine alignment, lumbar spine alignment with lumbar support, and neutral wrist alignment for typing and use of computer mouse.    Seated DB exercises:   Alternating B bicep curls: 3# DB's, 1x12. Initial LUE elbow pain. Eased as pt progressed.  Alternating B wrist extension: 1x12/UE    PT Education - 08/29/20 1307    Education Details form/technique with exercise. Desk ergonomics for return to work.    Person(s) Educated Patient    Methods Explanation;Demonstration    Comprehension Verbalized understanding;Returned demonstration            PT Short Term Goals - 07/30/20 1426      PT SHORT TERM GOAL #1   Title Patient will improve grip strength to 15 kg to be able to perform object manipulation ADL's more efficiently.    Baseline 07/30/20:10    Time 3    Period Weeks    Status New    Target Date 08/20/20      PT SHORT TERM GOAL #2   Title Patient will show indepedence and compliance with HEP to minimize pain and improve functional limiations.    Baseline 08/01/20: HEP given    Time 3    Period Weeks    Status New     Target Date 08/20/20             PT Long Term Goals - 07/30/20 1429      PT LONG TERM GOAL #1   Title Patient will improve FOTO or 63 score to increase functional capacity    Baseline 07/30/20: 56    Time 8    Period Weeks    Status New    Target Date 09/24/20      PT LONG TERM GOAL #2   Title Patient will  have a decrease in R UE pain to 5/10 or less to be able to return to work and perform work tasks    Baseline 07/30/20:10/10    Time 8    Period Weeks    Status New    Target Date 09/24/20      PT LONG TERM GOAL #3   Title Patient will have at least 50 degrees of cervical extension AROM to increase mobility to perform ADL's    Baseline 07/30/20:  19 degrees    Time 8    Period Weeks    Status New    Target Date 09/24/20      PT LONG TERM GOAL #4   Title --    Baseline --    Time --    Period --                 Plan - 08/29/20 1309    Clinical Impression Statement Pt educated on ergonomic desk set up for return to work to improve pt's symptoms. Pt did require min, VC/TC's for improved form with median and radial nerve glides with good carryover after. Tolerated added resistance to shoulder and BUE exercises with no increase in pain during session but did report increased LUE pain after bicep curls but able to improve to baseline with radial nerve glides. Pt educated to report back to PT on how return to work was for her symptoms after education provided on ergonomics. Pt can continue to benefit from skilled PT services to improve pain, mobility and functional capacity.    Personal Factors and Comorbidities Time since onset of injury/illness/exacerbation;Age    Examination-Activity Limitations Lift;Carry;Other    Examination-Participation Restrictions Laundry;Cleaning;Occupation    Stability/Clinical Decision Making Stable/Uncomplicated    Rehab Potential Good    PT Frequency 2x / week    PT Duration 8 weeks    PT Treatment/Interventions Passive range of  motion;Therapeutic activities;Therapeutic exercise;Patient/family education;Manual techniques    PT Next Visit Plan Desk ergonomics, shoulder and UE strengthening    Consulted and Agree with Plan of Care Patient           Patient will benefit from skilled therapeutic intervention in order to improve the following deficits and impairments:  Impaired sensation,Pain,Decreased activity tolerance,Decreased range of motion,Impaired UE functional use  Visit Diagnosis: Cervicalgia  Radiculopathy, cervical region     Problem List Patient Active Problem List   Diagnosis Date Noted  . Migraine with aura and without status migrainosus, not intractable 04/06/2019  . Neuropathy 04/06/2019  . Cervical stenosis (uterine cervix) 03/31/2019  . Essential hypertension 04/22/2018  . Hypertriglyceridemia 04/22/2018  . History of chest pain 04/22/2018  . Tobacco abuse 04/22/2018    Salem Caster. Fairly IV, PT, DPT Physical Therapist- [facility]  08/29/2020, 1:25 PM  [place] [facility] [address] [place], [address] Phone: [phone]   Fax:  [phone]  Name: Robyn Anderson MRN: [id number] Date of Birth: Aug 27, 1965

## 2020-09-03 ENCOUNTER — Other Ambulatory Visit: Payer: Self-pay

## 2020-09-03 ENCOUNTER — Ambulatory Visit: Payer: Managed Care, Other (non HMO)

## 2020-09-03 DIAGNOSIS — M542 Cervicalgia: Secondary | ICD-10-CM | POA: Diagnosis not present

## 2020-09-03 DIAGNOSIS — M5412 Radiculopathy, cervical region: Secondary | ICD-10-CM

## 2020-09-03 NOTE — Therapy (Signed)
[facility] [address] [place], [address] Phone: [phone]   Fax:  [phone]  Physical Therapy Treatment  Patient Details  Name: Robyn Anderson MRN: [id number] Date of Birth: December 30, 1965 Referring Provider (PT): Girtha Hake MD   Encounter Date: 09/03/2020   PT End of Session - 09/03/20 0820    Visit Number 7    Number of Visits 17    Date for PT Re-Evaluation 09/24/20    Authorization Type [employer]    Authorization Time Period 07/30/20 - 10/22/20    PT Start Time 0816    PT Stop Time 0912    PT Time Calculation (min) 56 min    Activity Tolerance Patient tolerated treatment well    Behavior During Therapy [facility] for tasks assessed/performed           Past Medical History:  Diagnosis Date  . Barrett esophagus   . Coronary artery disease   . GERD (gastroesophageal reflux disease)   . Headache    migraines 1-2/mo  . Hypertension   . Obesity (BMI 30-39.9)   . Postmenopausal bleeding 01/20/2019    Past Surgical History:  Procedure Laterality Date  . ATHERECTOMY Right    renal  . CARPAL TUNNEL RELEASE Left   . CARPAL TUNNEL RELEASE Right 09/27/2015   Procedure: OPEN CARPAL TUNNEL RELEASE RIGHT HAND;  Surgeon: Leanor Kail, MD;  Location: [place];  Service: Orthopedics;  Laterality: Right;  . CESAREAN SECTION     x2  . COLONOSCOPY    . ESOPHAGOGASTRODUODENOSCOPY    . HYSTEROSCOPY WITH D & C N/A 04/18/2019   Procedure: DILATATION AND CURETTAGE /HYSTEROSCOPY;  Surgeon: Aletha Halim, MD;  Location: [place];  Service: Gynecology;  Laterality: N/A;  . LAPAROSCOPIC SALPINGO OOPHERECTOMY Bilateral 04/18/2019   Procedure: LAPAROSCOPIC SALPINGO OOPHORECTOMY;  Surgeon: Aletha Halim, MD;  Location: [place];  Service: Gynecology;  Laterality: Bilateral;  . TUBAL LIGATION      There were no vitals filed for this visit.   Subjective Assessment -  09/03/20 0816    Subjective Pt reports LUE is hurting > RUE at work and here today in PT. Pain in LUE rated at 6/10 and RUE at 3/10    Pertinent History Robyn Anderson is a pleasant 55yoF that was referred to OPPT by Dr. Alba Destine, MD for an evaluation of cervical neck pain with radicular symptoms. The onset of this condition was January 2022 with no specified mechanism of injury. Symptoms include sharp cervical neck/ RUE pain with numbness/throbbing in the thumb/index fingers. Patient had a MRI on neck 2/3 weeks ago and has an MRI scheduled for her back on April 16th.  Patient also reports chronic low back pain with R LE sciatica Patient has had surgery in B hands for carpal tunnel. Patient is L hand dominant and reports previous history of pain in L UE but does not bother as much as the R UE. 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 Patient states that symptoms are exuberated when clicking a computer mouse at work with R hand. Patient was taken out of work on March 5TH due to condition. Patient would like to return to work with decreased pain.    Limitations Lifting;House hold activities    Diagnostic tests MRI spine November 2021, pt was told to have stenosis in low back as well as arthritis.    Patient Stated Goals To  return to work    Currently in Pain? Yes    Pain Orientation Left    Pain Descriptors / Indicators Sharp    Pain Type Chronic pain    Pain Onset More than a month ago    Pain Frequency Intermittent    Pain Score 6    Pain Location Arm    Pain Orientation Left    Pain Type Chronic pain    Pain Onset More than a month ago    Pain Frequency Intermittent    Pain Onset More than a month ago           -LUE Median nerve glides in sitting x 10  (UE in 90 shoulder abduction, full elbow extension and supination, alternating wrist extension/flexion; paired with head side bending movement)             - verbal cuing and  tactile cuing at L wrist/hand and Cervical spine. Good carryover after cues. PT educated to focus on improving wrist motion and use of mirror for cervical motion to improve symptom reduction.  -B Cervical rotation self SNAGs with towel in sitting  x 12 R and x12 L .Mod VC's TC's for form/technique. Good carryover after cuing.    - B Radial nerve glide in sitting with ipsi Cervical SB x 20             - UE shoulder depression, shoulder IR, full elbow extension/pronation, wrist flexion  Cervical retraction SNAGS into towel: 1x8 reps. Mid back and UT compensation. Progressed  to seated with towel folded behind occiput on wall. 1x12. Pt reports improved ability to perform properly and reduced mid back and upper trap compensation with back supported.   Standing L wrist flexor and extensor stretch: 3x30 sec stretch.   Mod-Max VC's and TC's for form/technique. Difficulty sequencing.   Functional cone stacking overhead on shelf with LUE: 4x8 cones. Education of cervical retraction an scap squeeze for improve posture and stability. Reports symptom improvement from 7/10 NPS in LUE dorsal forearm to 3/10 NPS after education on improved form/posture with cone stacking.  Box lifts from empty box on step stool at 90 deg to elevated mat table 30" tall to mimic box lifting/lowering at work. Watched pt perform her baseline task x2. Lumbar flexion, no use of BLE's and forward head posture and protracted scapulae holding box out in front of her and not close to body. PT demo and education on use of neutral spine, squatting to pick up box, holding box closer to BOS and shoulder blade squeeze for support. x4 after PT education with pt reports of improved symptoms compared to her baseline performance. Progress next session with added weight to box if good carryover and form next session.                          PT Education - 09/03/20 0819    Education Details form/technique with exercise.     Person(s) Educated Patient    Methods Explanation;Demonstration    Comprehension Verbalized understanding;Returned demonstration            PT Short Term Goals - 07/30/20 1426      PT SHORT TERM GOAL #1   Title Patient will improve grip strength to 15 kg to be able to perform object manipulation ADL's more efficiently.    Baseline 07/30/20:10    Time 3    Period Weeks    Status New    Target  Date 08/20/20      PT SHORT TERM GOAL #2   Title Patient will show indepedence and compliance with HEP to minimize pain and improve functional limiations.    Baseline 08/01/20: HEP given    Time 3    Period Weeks    Status New    Target Date 08/20/20             PT Long Term Goals - 07/30/20 1429      PT LONG TERM GOAL #1   Title Patient will improve FOTO or 63 score to increase functional capacity    Baseline 07/30/20: 56    Time 8    Period Weeks    Status New    Target Date 09/24/20      PT LONG TERM GOAL #2   Title Patient will have a decrease in R UE pain to 5/10 or less to be able to return to work and perform work tasks    Baseline 07/30/20:10/10    Time 8    Period Weeks    Status New    Target Date 09/24/20      PT LONG TERM GOAL #3   Title Patient will have at least 50 degrees of cervical extension AROM to increase mobility to perform ADL's    Baseline 07/30/20:  19 degrees    Time 8    Period Weeks    Status New    Target Date 09/24/20      PT LONG TERM GOAL #4   Title --    Baseline --    Time --    Period --                 Plan - 09/03/20 0917    Clinical Impression Statement Further education provided on lifting ergonomics and task manipulation for job tasks based off of pt reports of pain with lifting since returning to work. Further emphasis on good form with nerve glides and use of mirror to ensure adequate cervical motion insted of thoracic motion. ABle to improve LUE pain with nerve glides and lifting mechancis from 6-7/10 to 3/10 NPS. PT  will continue to improve posture and mechanics since pt returnied to work so pt will have reduced symptoms and improved functional capacity for ADL's and work realted tasks.    Personal Factors and Comorbidities Time since onset of injury/illness/exacerbation;Age    Examination-Activity Limitations Lift;Carry;Other    Examination-Participation Restrictions Laundry;Cleaning;Occupation    Stability/Clinical Decision Making Stable/Uncomplicated    Rehab Potential Good    PT Frequency 2x / week    PT Duration 8 weeks    PT Treatment/Interventions Passive range of motion;Therapeutic activities;Therapeutic exercise;Patient/family education;Manual techniques    PT Next Visit Plan Reassess lifting box mechanics from floor to elevated mat table at 90 degrees. cone stacking with LUE overhead gripping tasks with focus on posture and mechanics related to work tasks.    Consulted and Agree with Plan of Care Patient           Patient will benefit from skilled therapeutic intervention in order to improve the following deficits and impairments:  Impaired sensation,Pain,Decreased activity tolerance,Decreased range of motion,Impaired UE functional use  Visit Diagnosis: Cervicalgia  Radiculopathy, cervical region     Problem List Patient Active Problem List   Diagnosis Date Noted  . Migraine with aura and without status migrainosus, not intractable 04/06/2019  . Neuropathy 04/06/2019  . Cervical stenosis (uterine cervix) 03/31/2019  . Essential hypertension 04/22/2018  .  Hypertriglyceridemia 04/22/2018  . History of chest pain 04/22/2018  . Tobacco abuse 04/22/2018    Salem Caster. Fairly IV, PT, DPT Physical Therapist- [facility]  09/03/2020, 9:33 AM  [facility] [address], [place], [address] Phone: [phone]   Fax:  [phone]  Name: MODEST DRAEGER MRN: [id number] Date of  Birth: 05/21/1965

## 2020-09-05 ENCOUNTER — Ambulatory Visit: Payer: Managed Care, Other (non HMO)

## 2020-09-05 ENCOUNTER — Other Ambulatory Visit: Payer: Self-pay

## 2020-09-05 DIAGNOSIS — M542 Cervicalgia: Secondary | ICD-10-CM

## 2020-09-05 DIAGNOSIS — M5412 Radiculopathy, cervical region: Secondary | ICD-10-CM

## 2020-09-05 NOTE — Therapy (Signed)
[facility] [address] [place], [address] Phone: [phone]   Fax:  [phone]  Physical Therapy Treatment/Reassesment   Patient Details  Name: Robyn Anderson MRN: [id number] Date of Birth: 1966/02/20 Referring Provider (PT): Girtha Hake MD   Encounter Date: 09/05/2020   PT End of Session - 09/05/20 0856    Visit Number 8    Number of Visits 17    Date for PT Re-Evaluation 09/24/20    Authorization Type [employer]    Authorization Time Period 07/30/20 - 10/22/20    PT Start Time 0821    PT Stop Time 0900    PT Time Calculation (min) 39 min    Activity Tolerance Patient tolerated treatment well    Behavior During Therapy [facility] for tasks assessed/performed           Past Medical History:  Diagnosis Date  . Barrett esophagus   . Coronary artery disease   . GERD (gastroesophageal reflux disease)   . Headache    migraines 1-2/mo  . Hypertension   . Obesity (BMI 30-39.9)   . Postmenopausal bleeding 01/20/2019    Past Surgical History:  Procedure Laterality Date  . ATHERECTOMY Right    renal  . CARPAL TUNNEL RELEASE Left   . CARPAL TUNNEL RELEASE Right 09/27/2015   Procedure: OPEN CARPAL TUNNEL RELEASE RIGHT HAND;  Surgeon: Leanor Kail, MD;  Location: [place];  Service: Orthopedics;  Laterality: Right;  . CESAREAN SECTION     x2  . COLONOSCOPY    . ESOPHAGOGASTRODUODENOSCOPY    . HYSTEROSCOPY WITH D & C N/A 04/18/2019   Procedure: DILATATION AND CURETTAGE /HYSTEROSCOPY;  Surgeon: Aletha Halim, MD;  Location: [place];  Service: Gynecology;  Laterality: N/A;  . LAPAROSCOPIC SALPINGO OOPHERECTOMY Bilateral 04/18/2019   Procedure: LAPAROSCOPIC SALPINGO OOPHORECTOMY;  Surgeon: Aletha Halim, MD;  Location: [place];  Service: Gynecology;  Laterality: Bilateral;  . TUBAL LIGATION      There were no vitals filed for this visit.   Subjective  Assessment - 09/05/20 0822    Subjective Pt reports doing ok today. She says she feels therapy is going well so far. She notices her Rt distal arms pain is improved, but her left distal arm pain continues to be aggravated now for 2 weeks. Pt went back to work last week, has been able to perform her job with minor modifications. Pt reports difficulty fdinding time to perform exercises at work when in pain.    Pertinent History Robyn Anderson is a pleasant 69yoF that was referred to OPPT by Dr. Alba Destine, MD for an evaluation of cervical neck pain with radicular symptoms. The onset of this condition was January 2022 with no specified mechanism of injury. Symptoms include sharp cervical neck/ RUE pain with numbness/throbbing in the thumb/index fingers. Patient had a MRI on neck 2/3 weeks ago and has an MRI scheduled for her back on April 16th.  Patient also reports chronic low back pain with R LE sciatica Patient has had surgery in B hands for carpal tunnel. Patient is L hand dominant and reports previous history of pain in L UE but does not bother as much as the R UE. 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 Patient states that symptoms are exuberated when clicking a computer mouse at work with R hand. Patient was taken out of work on March 5TH due to condition. Patient  would like to return to work with decreased pain.    Limitations Lifting;House hold activities    How long can you sit comfortably? No related to neck/arm pain    How long can you stand comfortably? No related to neck/arm pain    How long can you walk comfortably? No related to neck/arm pain    Diagnostic tests MRI spine November 2021, pt was told to have stenosis in low back as well as arthritis.    Patient Stated Goals To return to work    Currently in Pain? Yes    Pain Score --   4-5/10 Right distal arm; 5-6/10 Left distal arm   Pain Descriptors / Indicators Sharp;Throbbing               [facility] PT Assessment - 09/05/20 0001      Observation/Other Assessments   Focus on Therapeutic Outcomes (FOTO)  58   53/100 at eval     AROM   Cervical Flexion 63 (no pain)   52 at eval   Cervical Extension 30 (no pain)   19 at eval   Cervical - Right Side Bend --   48 at eval, WNL, not retested   Cervical - Left Side Bend --   50 at eval, WNL, not retested   Cervical - Right Rotation 61 (end range tenderness)   46 and painful at eval   Cervical - Left Rotation 70 (end range tenderness)   51 at eval with pain     Strength   Right/Left hand Right;Left    Right Hand Grip (lbs) 14, 18, 20 kg   10 kg at evlauation   Left Hand Grip (lbs) 18, 23, 18kg   10kg at eval         *education on ergonomics at work station:  1. Body proximity to work station to keep [place] 'in the tube' during mouse actiivty 2. Self monitoring trunk, thoracic, and forward head posture over the course of work shift to maintain best tolerated head and shoulder position minimizing nerve aggravation 2/2 empirical postures related to narrowing of neurological spaces  -Hooklying thoracic towel roll stretch (longitudinal) x 3 minutes  -conitnued as above with BUE wand flexion t-spine extension 25x3secH      PT Short Term Goals - 09/05/20 0841      PT SHORT TERM GOAL #1   Title Patient will improve grip strength to 15 kg to be able to perform object manipulation ADL's more efficiently.    Baseline 07/30/20:10kg on 5/19: 18kg Rt, 23kg Lt    Time 3    Period Weeks    Status Achieved    Target Date 08/20/20      PT SHORT TERM GOAL #2   Title Patient will show indepedence and compliance with HEP to minimize pain and improve functional limiations.    Baseline 08/01/20: HEP given; 5/19 current and compliant    Time 3    Period Weeks    Status Achieved    Target Date 08/20/20             PT Long Term Goals - 09/05/20 0843      PT LONG TERM GOAL #1   Title Patient will improve FOTO or 63 score  to increase functional capacity    Baseline 07/30/20: 53 at eval; 09/05/20: 58    Time 8    Period Weeks    Status On-going    Target Date 09/24/20      PT LONG  TERM GOAL #2   Title Patient will have a decrease in R UE pain to 5/10 or less to be able to return to work and perform work tasks    Baseline 07/30/20:10/10; 09/05/20: in past week has been mostly 5/10, peaking 7/10 one day so far.    Time 8    Period Weeks    Status On-going    Target Date 09/24/20      PT LONG TERM GOAL #3   Title Patient will have at least 36 (previously 50) degrees of cervical extension AROM to increase mobility to perform ADL's    Baseline 07/30/20:19 degrees; 31 degrees on 09/05/20    Time 8    Period Weeks    Status Revised    Target Date 09/24/20                 Plan - 09/05/20 0856    Clinical Impression Statement Reassessment performed today as pt is now 5 weeks post eval. Pt has made considerable improvements in cervical ROM, grip strength, and worst rated pain. Pt has also made progress in FOTO score. Short term goals met. Longterm goals with excellent progress and/or achievement. Continued forward with thoracic stretching targeting reduced kyphosis, scapular depression/ant tilt, and reduced pec minor tightness/shortening. Pt tolerates wand flexion without any UE nerve tension limitations. Discussed more workplace specific ergonomics. Will continue to benefit from skilled PT intervention to reduce pain, improve mobility in order to maximize tolerance to ADL, IADL, and work related activity.    Personal Factors and Comorbidities Time since onset of injury/illness/exacerbation;Age    Examination-Activity Limitations Lift;Carry;Other    Examination-Participation Restrictions Laundry;Cleaning;Occupation    Stability/Clinical Decision Making Stable/Uncomplicated    Clinical Decision Making Moderate    Rehab Potential Good    PT Frequency 2x / week    PT Duration 8 weeks    PT  Treatment/Interventions Passive range of motion;Therapeutic activities;Therapeutic exercise;Patient/family education;Manual techniques    PT Next Visit Plan Reassess lifting box mechanics from floor to elevated mat table at 90 degrees. cone stacking with LUE overhead gripping tasks with focus on posture and mechanics related to work tasks.    PT Home Exercise Plan Update HEP as needed (consider adding in thoracic towel roll stretch)    Consulted and Agree with Plan of Care Patient           Patient will benefit from skilled therapeutic intervention in order to improve the following deficits and impairments:  Impaired sensation,Pain,Decreased activity tolerance,Decreased range of motion,Impaired UE functional use  Visit Diagnosis: Cervicalgia  Radiculopathy, cervical region     Problem List Patient Active Problem List   Diagnosis Date Noted  . Migraine with aura and without status migrainosus, not intractable 04/06/2019  . Neuropathy 04/06/2019  . Cervical stenosis (uterine cervix) 03/31/2019  . Essential hypertension 04/22/2018  . Hypertriglyceridemia 04/22/2018  . History of chest pain 04/22/2018  . Tobacco abuse 04/22/2018   9:17 AM, 09/05/20 Etta Grandchild, PT, DPT Physical Therapist - [place] [phone] (Office)    Ritta Hammes C 09/05/2020, 8:58 AM  [facility] [address] [place], [address] Phone: [phone]   Fax:  [phone]  Name: Robyn Anderson MRN: [id number] Date of Birth: January 08, 1966

## 2020-09-18 ENCOUNTER — Ambulatory Visit: Payer: Managed Care, Other (non HMO) | Attending: Physical Medicine & Rehabilitation

## 2020-09-18 ENCOUNTER — Other Ambulatory Visit: Payer: Self-pay

## 2020-09-18 DIAGNOSIS — M5412 Radiculopathy, cervical region: Secondary | ICD-10-CM | POA: Diagnosis present

## 2020-09-18 DIAGNOSIS — M542 Cervicalgia: Secondary | ICD-10-CM | POA: Diagnosis not present

## 2020-09-18 DIAGNOSIS — N882 Stricture and stenosis of cervix uteri: Secondary | ICD-10-CM | POA: Diagnosis not present

## 2020-09-18 DIAGNOSIS — M6281 Muscle weakness (generalized): Secondary | ICD-10-CM

## 2020-09-18 NOTE — Therapy (Signed)
[facility] [address] [place], [address] Phone: [phone]   Fax:  [phone]  Physical Therapy Treatment  Patient Details  Name: Robyn Anderson MRN: [id number] Date of Birth: 25-Jan-1966 Referring Provider (PT): Girtha Hake MD   Encounter Date: 09/18/2020   PT End of Session - 09/18/20 0948    Visit Number 9    Number of Visits 17    Date for PT Re-Evaluation 09/24/20    Authorization Type [employer]    Authorization Time Period 07/30/20 - 10/22/20    PT Start Time 0945    PT Stop Time 1031    PT Time Calculation (min) 46 min    Activity Tolerance Patient tolerated treatment well    Behavior During Therapy [facility] for tasks assessed/performed           Past Medical History:  Diagnosis Date  . Barrett esophagus   . Coronary artery disease   . GERD (gastroesophageal reflux disease)   . Headache    migraines 1-2/mo  . Hypertension   . Obesity (BMI 30-39.9)   . Postmenopausal bleeding 01/20/2019    Past Surgical History:  Procedure Laterality Date  . ATHERECTOMY Right    renal  . CARPAL TUNNEL RELEASE Left   . CARPAL TUNNEL RELEASE Right 09/27/2015   Procedure: OPEN CARPAL TUNNEL RELEASE RIGHT HAND;  Surgeon: Leanor Kail, MD;  Location: [place];  Service: Orthopedics;  Laterality: Right;  . CESAREAN SECTION     x2  . COLONOSCOPY    . ESOPHAGOGASTRODUODENOSCOPY    . HYSTEROSCOPY WITH D & C N/A 04/18/2019   Procedure: DILATATION AND CURETTAGE /HYSTEROSCOPY;  Surgeon: Aletha Halim, MD;  Location: [place];  Service: Gynecology;  Laterality: N/A;  . LAPAROSCOPIC SALPINGO OOPHERECTOMY Bilateral 04/18/2019   Procedure: LAPAROSCOPIC SALPINGO OOPHORECTOMY;  Surgeon: Aletha Halim, MD;  Location: [place];  Service: Gynecology;  Laterality: Bilateral;  . TUBAL LIGATION      There were no vitals filed for this visit.   Subjective Assessment -  09/18/20 0945    Subjective Pt reports LUE hurting > RUE. Current pain levels bilaterally is a 3/10 NPS. Believes due to getting off work. Is in the process of moving to new house. Besides her hand and LUE thorbibng at work, she is able to complete her job tasks.    Pertinent History Robyn Anderson is a pleasant 49yoF that was referred to OPPT by Dr. Alba Destine, MD for an evaluation of cervical neck pain with radicular symptoms. The onset of this condition was January 2022 with no specified mechanism of injury. Symptoms include sharp cervical neck/ RUE pain with numbness/throbbing in the thumb/index fingers. Patient had a MRI on neck 2/3 weeks ago and has an MRI scheduled for her back on April 16th.  Patient also reports chronic low back pain with R LE sciatica Patient has had surgery in B hands for carpal tunnel. Patient is L hand dominant and reports previous history of pain in L UE but does not bother as much as the R UE. 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 Patient states that symptoms are exuberated when clicking a computer mouse at work with R hand. Patient was taken out of work on March 5TH due to condition. Patient would like to return to work with decreased pain.    Limitations Lifting;House hold activities    How long can you sit  comfortably? No related to neck/arm pain    How long can you stand comfortably? No related to neck/arm pain    How long can you walk comfortably? No related to neck/arm pain    Diagnostic tests MRI spine November 2021, pt was told to have stenosis in low back as well as arthritis.    Patient Stated Goals To return to work    Currently in Pain? Yes    Pain Score 3     Pain Location Arm    Pain Orientation Left;Right    Pain Descriptors / Indicators Throbbing;Aching    Pain Type Chronic pain           There.ex:   -Hooklying thoracic towel roll stretch (longitudinal) with forward shoulder  elevation x10, shoulder abduction bilaterally. X10  continued as above with BUE wand flexion t-spine extension 25x3secH   Cervical retraction into towel: 2x12 reps. seated with towel folded behind occiput on wall. 1x12. Pt reports improved ability to perform properly and reduced mid back and upper trap compensation with back supported. Good carryover, pt required initial VC's/TC's for form/technique and demo.  Standing L wrist flexor and extensor stretch with elbow extended: 3x15-20 sec stretch each. Good carryover with form/technique compared to previous session.    Seated scap retractions with GTB: 2x20 reps. VC's for form/technique to reduce UT compensation.                  Self care management:     Box lifts from empty box on floor to elevated mat table to mimic box lifting/lowering at work. Initial PT demo for re-education on form/technique with neutral spine and use of BLE's for primary mechanism, to lift box with cervical retraction and scap retractions.   1x5 with squat to box lift. Initial heel rise with squat with reports of gastroc tightness/pain.   1x5 squat to box lift placed on elevated mat table at 90 deg angle with VC's for maintaining cervical and scap retraction and box close to body.  200' box carry maintaining cervical and scap retraction for functional box carries for work and house moving tasks. Pt reports slight increase in LUE dorsal forearm pain up to 2/10 NPS but improved after rest and wrist/elbow extensor stretch.    PT Education - 09/18/20 0948    Education Details posture and functional lifting mechanics.    Person(s) Educated Patient    Methods Explanation;Demonstration    Comprehension Verbalized understanding;Returned demonstration            PT Short Term Goals - 09/05/20 0841      PT SHORT TERM GOAL #1   Title Patient will improve grip strength to 15 kg to be able to perform object manipulation ADL's more efficiently.    Baseline 07/30/20:10kg on 5/19:  18kg Rt, 23kg Lt    Time 3    Period Weeks    Status Achieved    Target Date 08/20/20      PT SHORT TERM GOAL #2   Title Patient will show indepedence and compliance with HEP to minimize pain and improve functional limiations.    Baseline 08/01/20: HEP given; 5/19 current and compliant    Time 3    Period Weeks    Status Achieved    Target Date 08/20/20             PT Long Term Goals - 09/05/20 0843      PT LONG TERM GOAL #1   Title Patient will improve FOTO  or 63 score to increase functional capacity    Baseline 07/30/20: 53 at eval; 09/05/20: 58    Time 8    Period Weeks    Status On-going    Target Date 09/24/20      PT LONG TERM GOAL #2   Title Patient will have a decrease in R UE pain to 5/10 or less to be able to return to work and perform work tasks    Baseline 07/30/20:10/10; 09/05/20: in past week has been mostly 5/10, peaking 7/10 one day so far.    Time 8    Period Weeks    Status On-going    Target Date 09/24/20      PT LONG TERM GOAL #3   Title Patient will have at least 36 (previously 50) degrees of cervical extension AROM to increase mobility to perform ADL's    Baseline 07/30/20:19 degrees; 31 degrees on 09/05/20    Time 8    Period Weeks    Status Revised    Target Date 09/24/20                 Plan - 09/18/20 1034    Clinical Impression Statement Further focus on postural muscle strengthening and safe, postural lifting techniques due to job requirements and pt reporting moving into new house this upcoming weekend. Pt displaying excellent form/technique from previous sessions with new thoracic mobility and UE exercises.Pt reports reduction of pain from 3/10 NPS to 1/10 NPS in UE's however after functional box lifting and wlaking pt reports LUE dorsal forearm pain elevated up to 2/10 NPS that is improved by new elbow/wrist extensor strengthening. Pt will continue to benefot from skilled PT services to address pain and functional mobility so pt can  return to PLOF.    Personal Factors and Comorbidities Time since onset of injury/illness/exacerbation;Age    Examination-Activity Limitations Lift;Carry;Other    Examination-Participation Restrictions Laundry;Cleaning;Occupation    Stability/Clinical Decision Making Stable/Uncomplicated    Rehab Potential Good    PT Frequency 2x / week    PT Duration 8 weeks    PT Treatment/Interventions Passive range of motion;Therapeutic activities;Therapeutic exercise;Patient/family education;Manual techniques    PT Next Visit Plan Reassess lifting box mechanics from floor to elevated mat table at 90 degrees. cone stacking with LUE overhead gripping tasks with focus on posture and mechanics related to work tasks.    PT Home Exercise Plan Update HEP as needed (consider adding in thoracic towel roll stretch)    Consulted and Agree with Plan of Care Patient           Patient will benefit from skilled therapeutic intervention in order to improve the following deficits and impairments:  Impaired sensation,Pain,Decreased activity tolerance,Decreased range of motion,Impaired UE functional use  Visit Diagnosis: Cervicalgia  Radiculopathy, cervical region  Muscle weakness (generalized)     Problem List Patient Active Problem List   Diagnosis Date Noted  . Migraine with aura and without status migrainosus, not intractable 04/06/2019  . Neuropathy 04/06/2019  . Cervical stenosis (uterine cervix) 03/31/2019  . Essential hypertension 04/22/2018  . Hypertriglyceridemia 04/22/2018  . History of chest pain 04/22/2018  . Tobacco abuse 04/22/2018    Salem Caster. Fairly IV, PT, DPT Physical Therapist- [facility]  09/18/2020, 10:43 AM  [place] [facility] [address], [place], [address] Phone: [phone]   Fax:  [phone]  Name: Robyn Anderson MRN: [id number] Date of Birth: 03/21/1966

## 2020-09-23 ENCOUNTER — Ambulatory Visit: Payer: Managed Care, Other (non HMO)

## 2020-09-23 ENCOUNTER — Other Ambulatory Visit: Payer: Self-pay

## 2020-09-23 DIAGNOSIS — M5412 Radiculopathy, cervical region: Secondary | ICD-10-CM

## 2020-09-23 DIAGNOSIS — M6281 Muscle weakness (generalized): Secondary | ICD-10-CM

## 2020-09-23 DIAGNOSIS — M542 Cervicalgia: Secondary | ICD-10-CM | POA: Diagnosis not present

## 2020-09-23 NOTE — Therapy (Signed)
[place] [facility] [address], [place], [address] Phone: [phone]   Fax:  [phone]  Physical Therapy Treatment  Patient Details  Name: SAM WUNSCHEL MRN: [id number] Date of Birth: 05/07/1965 Referring Provider (PT): Girtha Hake MD   Encounter Date: 09/23/2020   PT End of Session - 09/23/20 0951    Visit Number 10    Number of Visits 17    Date for PT Re-Evaluation 09/24/20    Authorization Type [employer]    Authorization Time Period 07/30/20 - 10/22/20    PT Start Time 0946    PT Stop Time 1029    PT Time Calculation (min) 43 min    Activity Tolerance Patient tolerated treatment well    Behavior During Therapy [facility] for tasks assessed/performed           Past Medical History:  Diagnosis Date  . Barrett esophagus   . Coronary artery disease   . GERD (gastroesophageal reflux disease)   . Headache    migraines 1-2/mo  . Hypertension   . Obesity (BMI 30-39.9)   . Postmenopausal bleeding 01/20/2019    Past Surgical History:  Procedure Laterality Date  . ATHERECTOMY Right    renal  . CARPAL TUNNEL RELEASE Left   . CARPAL TUNNEL RELEASE Right 09/27/2015   Procedure: OPEN CARPAL TUNNEL RELEASE RIGHT HAND;  Surgeon: Leanor Kail, MD;  Location: [place];  Service: Orthopedics;  Laterality: Right;  . CESAREAN SECTION     x2  . COLONOSCOPY    . ESOPHAGOGASTRODUODENOSCOPY    . HYSTEROSCOPY WITH D & C N/A 04/18/2019   Procedure: DILATATION AND CURETTAGE /HYSTEROSCOPY;  Surgeon: Aletha Halim, MD;  Location: [place];  Service: Gynecology;  Laterality: N/A;  . LAPAROSCOPIC SALPINGO OOPHERECTOMY Bilateral 04/18/2019   Procedure: LAPAROSCOPIC SALPINGO OOPHORECTOMY;  Surgeon: Aletha Halim, MD;  Location: [place];  Service: Gynecology;  Laterality: Bilateral;  . TUBAL LIGATION      There were no vitals filed for this visit.   Subjective Assessment -  09/23/20 0948    Subjective Pt reports able to move boxes for her house over the weekend. Believes ergonomics and lifting tchniques were helpful but brought pain up to a 6/10 NPS. With PT exercies pt able to decrease pain to a 2-3/10 NPS. Today prior to session, pt reporting a 3-4/10 NPS.    Pertinent History Margart Zemanek is a pleasant 45yoF that was referred to OPPT by Dr. Alba Destine, MD for an evaluation of cervical neck pain with radicular symptoms. The onset of this condition was January 2022 with no specified mechanism of injury. Symptoms include sharp cervical neck/ RUE pain with numbness/throbbing in the thumb/index fingers. Patient had a MRI on neck 2/3 weeks ago and has an MRI scheduled for her back on April 16th.  Patient also reports chronic low back pain with R LE sciatica Patient has had surgery in B hands for carpal tunnel. Patient is L hand dominant and reports previous history of pain in L UE but does not bother as much as the R UE. 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 Patient states that symptoms are exuberated when clicking a computer mouse at work with R hand. Patient was taken out of work on March 5TH due to condition. Patient would like to return to work with decreased pain.    Limitations Lifting;House hold activities    How long  can you sit comfortably? No related to neck/arm pain    How long can you stand comfortably? No related to neck/arm pain    How long can you walk comfortably? No related to neck/arm pain    Diagnostic tests MRI spine November 2021, pt was told to have stenosis in low back as well as arthritis.    Patient Stated Goals To return to work    Currently in Pain? Yes    Pain Score 4     Pain Location Arm    Pain Orientation Right;Left    Pain Descriptors / Indicators Throbbing;Aching    Pain Type Chronic pain    Pain Onset More than a month ago    Pain Frequency Intermittent    Pain  Orientation Left    Pain Descriptors / Indicators Sharp    Pain Type Chronic pain    Pain Onset More than a month ago    Pain Frequency Intermittent          There.ex:    Reassessment of goals first 15 min of session. Pt making progress towards long term goals.   R/L cervical rotation SNAGS: 2x12, 5 sec holds   Seated, resisted cervical retractions against RTB: 2x8. First set pt holding TB. Second set PT holding TB due to reports of RUE pain. Mod TC's for form/tehcnique throughout exercise.   Seated cervical retractions without resistance: 2x10 with max TC's on chin and occiput to improve targeted musculature.  Seated scap retractions with blue TB: 3x12 reps. VC's for reducing R UT compensation. Good carryover.     Post session pt reports 2-3/10 NPS on RUE. 3-4/10 NPS LUE.     PT Short Term Goals - 09/05/20 0841      PT SHORT TERM GOAL #1   Title Patient will improve grip strength to 15 kg to be able to perform object manipulation ADL's more efficiently.    Baseline 07/30/20:10kg on 5/19: 18kg Rt, 23kg Lt    Time 3    Period Weeks    Status Achieved    Target Date 08/20/20      PT SHORT TERM GOAL #2   Title Patient will show indepedence and compliance with HEP to minimize pain and improve functional limiations.    Baseline 08/01/20: HEP given; 5/19 current and compliant    Time 3    Period Weeks    Status Achieved    Target Date 08/20/20             PT Long Term Goals - 09/23/20 0957      PT LONG TERM GOAL #1   Title Patient will improve FOTO or 63 score to increase functional capacity    Baseline 07/30/20: 53 at eval; 09/05/20: 58; 6/6: 59/66    Time 8    Period Weeks    Status On-going      PT LONG TERM GOAL #2   Title Patient will have a decrease in R UE pain to 5/10 or less to be able to return to work and perform work tasks    Baseline 07/30/20:10/10; 09/05/20: in past week has been mostly 5/10, peaking 7/10 one day so far; 6/6: 6-7/10 prior to returning to work.     Time 8    Period Weeks    Status On-going      PT LONG TERM GOAL #3   Title Patient will have at least 36 (previously 50) degrees of cervical extension AROM to increase mobility to perform ADL's  Baseline 07/30/20:19 degrees; 31 degrees on 09/05/20; 6/6: 46 degrees    Time 8    Period Weeks    Status Achieved    Target Date 09/23/20                 Plan - 09/23/20 1042    Clinical Impression Statement Pt making progress otwards her goals. Pt still displaying the most difficulty with reducing pain with returning back to work in her LUE. Pt requires mod TC's/VC's for seuqencing exercises with cervical mobility and R/L UE's. COntinues to show improvements in pain levels however with said exercises. PT will continue to focus on improve LUE pain and cervical mobility so pt can work with reduced pain levles.    Personal Factors and Comorbidities Time since onset of injury/illness/exacerbation;Age    Examination-Activity Limitations Lift;Carry;Other    Examination-Participation Restrictions Laundry;Cleaning;Occupation    Stability/Clinical Decision Making Stable/Uncomplicated    Rehab Potential Good    PT Frequency 2x / week    PT Duration 8 weeks    PT Treatment/Interventions Passive range of motion;Therapeutic activities;Therapeutic exercise;Patient/family education;Manual techniques    PT Next Visit Plan Reassess lifting box mechanics from floor to elevated mat table at 90 degrees. cone stacking with LUE overhead gripping tasks with focus on posture and mechanics related to work tasks.    PT Home Exercise Plan Update HEP as needed (consider adding in thoracic towel roll stretch)    Consulted and Agree with Plan of Care Patient           Patient will benefit from skilled therapeutic intervention in order to improve the following deficits and impairments:  Impaired sensation,Pain,Decreased activity tolerance,Decreased range of motion,Impaired UE functional use  Visit  Diagnosis: Cervicalgia  Radiculopathy, cervical region  Muscle weakness (generalized)     Problem List Patient Active Problem List   Diagnosis Date Noted  . Migraine with aura and without status migrainosus, not intractable 04/06/2019  . Neuropathy 04/06/2019  . Cervical stenosis (uterine cervix) 03/31/2019  . Essential hypertension 04/22/2018  . Hypertriglyceridemia 04/22/2018  . History of chest pain 04/22/2018  . Tobacco abuse 04/22/2018    Salem Caster. Fairly IV, PT, DPT Physical Therapist- [facility]  09/23/2020, 10:46 AM  [place] [facility] [address] [place], [address] Phone: [phone]   Fax:  [phone]  Name: NEVAEN TREDWAY MRN: [id number] Date of Birth: 06-Mar-1966

## 2020-09-26 ENCOUNTER — Ambulatory Visit: Payer: Managed Care, Other (non HMO)

## 2020-09-26 DIAGNOSIS — M542 Cervicalgia: Secondary | ICD-10-CM | POA: Diagnosis not present

## 2020-09-26 DIAGNOSIS — M5412 Radiculopathy, cervical region: Secondary | ICD-10-CM

## 2020-09-26 DIAGNOSIS — M6281 Muscle weakness (generalized): Secondary | ICD-10-CM

## 2020-09-26 NOTE — Therapy (Signed)
[facility] [address] [place], [address] Phone: [phone]   Fax:  [phone]  Physical Therapy Treatment  Patient Details  Name: Robyn Anderson MRN: [id number] Date of Birth: June 19, 1965 Referring Provider (PT): Robyn Hake MD   Encounter Date: 09/26/2020   PT End of Session - 09/26/20 1127     Visit Number 11    Number of Visits 17    Date for PT Re-Evaluation 09/24/20    Authorization Type [employer]    Authorization Time Period 07/30/20 - 10/22/20    PT Start Time 1031    PT Stop Time 1115    PT Time Calculation (min) 44 min    Activity Tolerance Patient tolerated treatment well    Behavior During Therapy [facility] for tasks assessed/performed             Past Medical History:  Diagnosis Date   Barrett esophagus    Coronary artery disease    GERD (gastroesophageal reflux disease)    Headache    migraines 1-2/mo   Hypertension    Obesity (BMI 30-39.9)    Postmenopausal bleeding 01/20/2019    Past Surgical History:  Procedure Laterality Date   ATHERECTOMY Right    renal   CARPAL TUNNEL RELEASE Left    CARPAL TUNNEL RELEASE Right 09/27/2015   Procedure: OPEN CARPAL TUNNEL RELEASE RIGHT HAND;  Surgeon: Robyn Kail, MD;  Location: [place];  Service: Orthopedics;  Laterality: Right;   CESAREAN SECTION     x2   COLONOSCOPY     ESOPHAGOGASTRODUODENOSCOPY     HYSTEROSCOPY WITH D & C N/A 04/18/2019   Procedure: DILATATION AND CURETTAGE /HYSTEROSCOPY;  Surgeon: Robyn Halim, MD;  Location: [place];  Service: Gynecology;  Laterality: N/A;   LAPAROSCOPIC SALPINGO OOPHERECTOMY Bilateral 04/18/2019   Procedure: LAPAROSCOPIC SALPINGO OOPHORECTOMY;  Surgeon: Robyn Halim, MD;  Location: [facility];  Service: Gynecology;  Laterality: Bilateral;   TUBAL LIGATION      There were no vitals filed for this visit.   Subjective Assessment - 09/26/20 1032      Subjective RUE pain a 2/10 and 3/10 on LUE prior to treatment. Increased pain tuesday night prior and during work in [place]. Does report 12 hour work day prior that may have caused flare up in LUE pain. Pt reports it is same type of pain but different location than her dorsla forearm pain located b/t lat epicondyle and olecranon described as sharp when bending her elbow at end ranges and lifting heavy items.    Pertinent History Robyn Anderson is a pleasant 76yoF that was referred to OPPT by Dr. Alba Destine, MD for an evaluation of cervical neck pain with radicular symptoms. The onset of this condition was January 2022 with no specified mechanism of injury. Symptoms include sharp cervical neck/ RUE pain with numbness/throbbing in the thumb/index fingers. Patient had a MRI on neck 2/3 weeks ago and has an MRI scheduled for her back on April 16th.  Patient also reports chronic low back pain with R LE sciatica Patient has had surgery in B hands for carpal tunnel. Patient is L hand dominant and reports previous history of pain in L UE but does not bother as much as the R UE. 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 Patient states that symptoms are exuberated when clicking a computer mouse at work with R hand. Patient was taken out of  work on March 5TH due to condition. Patient would like to return to work with decreased pain.    Limitations Lifting;House hold activities    How long can you sit comfortably? No related to neck/arm pain    How long can you stand comfortably? No related to neck/arm pain    How long can you walk comfortably? No related to neck/arm pain    Diagnostic tests MRI spine November 2021, pt was told to have stenosis in low back as well as arthritis.    Patient Stated Goals To return to work    Currently in Pain? Yes    Pain Onset More than a month ago    Pain Onset More than a month ago            Manual Therapy: Pt  in supine for 10 min   Assessment of L elbow joint involvement due to reports of sharp pain at end ranges of elbow flexion. 1x2, 30 sec holds of manual elbow joint distraction. No subjective reports of pain relief lessening likelihood of joint space involvement.   STM for 8 min to dorsal forearm wrist extensor Anderson for possible radial nerve entrapment and pain modulation. Progressed to MWM resisted pronation reciprocal inhibition isometric holds (x10, 10 sec holds with maintained trigger point release at wrist extensor origin). Subjective reports further improvement in LUE pain with provided STM.    There.ex:    Seated exercises    LUE wrist flexor and wrist extensor stretches with elbow extended: 3x30 sec holds/muscle Anderson.    Shoulder girdle (LUE) elevation/depression: 2x10. Increased pain in LUE. VC's to modify range with improvement in symptoms on second set    Reciprocal inhibition exercises for radial nerve tension due to cervical radiculopathy:     LUE elbow isometric pronation holds: 2x10, 10 sec holds     LUE bicep curl isometric holds in varying ranges of pain free elbow flexion: 1x10, 10 sec holds    LUE shoulder IR with blue TB isometric holds: 1x10, 3 sec holds.   LUE radial nerve glides. Progressed with further ranges of shoulder extension + abduction compared to previous sessions: 2x10. VC's/TC's for form/technique with sequencing head motions with wrist.         Post session pt reports decrease in pain from 3/10 NPS to 1/10 NPS. Pt educated on wrist extensor stretch and isometric shoulder IR holds with blue TB to maintain decreased pain levels in LUE from radial nerve tension.   PT Education - 09/26/20 1034     Education Details form/technique with exercise.    Person(s) Educated Patient    Methods Explanation;Demonstration;Tactile cues;Verbal cues    Comprehension Verbalized understanding;Returned demonstration              PT Short Term Goals - 09/05/20 0841        PT SHORT TERM GOAL #1   Title Patient will improve grip strength to 15 kg to be able to perform object manipulation ADL's more efficiently.    Baseline 07/30/20:10kg on 5/19: 18kg Rt, 23kg Lt    Time 3    Period Weeks    Status Achieved    Target Date 08/20/20      PT SHORT TERM GOAL #2   Title Patient will show indepedence and compliance with HEP to minimize pain and improve functional limiations.    Baseline 08/01/20: HEP given; 5/19 current and compliant    Time 3    Period Weeks    Status Achieved  Target Date 08/20/20               PT Long Term Goals - 09/23/20 0957       PT LONG TERM GOAL #1   Title Patient will improve FOTO or 63 score to increase functional capacity    Baseline 07/30/20: 53 at eval; 09/05/20: 58; 6/6: 59/66    Time 8    Period Weeks    Status On-going      PT LONG TERM GOAL #2   Title Patient will have a decrease in R UE pain to 5/10 or less to be able to return to work and perform work tasks    Baseline 07/30/20:10/10; 09/05/20: in past week has been mostly 5/10, peaking 7/10 one day so far; 6/6: 6-7/10 prior to returning to work.    Time 8    Period Weeks    Status On-going      PT LONG TERM GOAL #3   Title Patient will have at least 36 (previously 50) degrees of cervical extension AROM to increase mobility to perform ADL's    Baseline 07/30/20:19 degrees; 31 degrees on 09/05/20; 6/6: 46 degrees    Time 8    Period Weeks    Status Achieved    Target Date 09/23/20                   Plan - 09/26/20 1127     Clinical Impression Statement Today's session focused on differential diagnosis of continuous LUE elbow pain. TTP noted b/t lateral epicondyle and olecranon. Assessment of elbow joint distraction with no improvement of symptoms indicating less likelihood of joint involvement. Appears to remain radial nerve tension from cervical radiculopathy due to increased symptoms with shoulder girdle elevation/depression, excessive end  range elbow flexion placing increased tension on nerve, and improvement with radial nerve glides and wrist extensor stretches. Use of reciprocal inhibition of isometric resisted pronation holds and bicep holds and shoulder IR and STM to dosal forearm at wirst extensor muscle belly's improving pain from 3/10 NPS to 1/10 NPS. Pt educated to continue to dorsal foreamr stretching and resisted shoulder IR in attempts to maintain decreased pain levels. Pt will continue to benefit from skilled PT services to further address cervical radiculopathy pain in L and R UE so pt can return to work and ADL's with no pain.    Personal Factors and Comorbidities Time since onset of injury/illness/exacerbation;Age    Examination-Activity Limitations Lift;Carry;Other    Examination-Participation Restrictions Laundry;Cleaning;Occupation    Stability/Clinical Decision Making Stable/Uncomplicated    Rehab Potential Good    PT Frequency 2x / week    PT Duration 8 weeks    PT Treatment/Interventions Passive range of motion;Therapeutic activities;Therapeutic exercise;Patient/family education;Manual techniques    PT Next Visit Plan Reassess lifting box mechanics from floor to elevated mat table at 90 degrees. cone stacking with LUE overhead gripping tasks with focus on posture and mechanics related to work tasks.    PT Home Exercise Plan Update HEP as needed (consider adding in thoracic towel roll stretch)    Consulted and Agree with Plan of Care Patient             Patient will benefit from skilled therapeutic intervention in order to improve the following deficits and impairments:  Impaired sensation, Pain, Decreased activity tolerance, Decreased range of motion, Impaired UE functional use  Visit Diagnosis: Cervicalgia  Radiculopathy, cervical region  Muscle weakness (generalized)     Problem List Patient Active Problem List  Diagnosis Date Noted   Migraine with aura and without status migrainosus, not  intractable 04/06/2019   Neuropathy 04/06/2019   Cervical stenosis (uterine cervix) 03/31/2019   Essential hypertension 04/22/2018   Hypertriglyceridemia 04/22/2018   History of chest pain 04/22/2018   Tobacco abuse 04/22/2018    Salem Caster. Fairly IV, PT, DPT Physical Therapist- [facility]  09/26/2020, 11:47 AM  [facility] [address] [place], [address] Phone: [phone]   Fax:  [phone]  Name: Robyn Anderson MRN: [id number] Date of Birth: [birth year]

## 2020-09-30 ENCOUNTER — Ambulatory Visit: Payer: Managed Care, Other (non HMO) | Admitting: Physical Therapy

## 2020-09-30 ENCOUNTER — Other Ambulatory Visit: Payer: Self-pay

## 2020-09-30 ENCOUNTER — Encounter: Payer: Self-pay | Admitting: Physical Therapy

## 2020-09-30 DIAGNOSIS — M542 Cervicalgia: Secondary | ICD-10-CM | POA: Diagnosis not present

## 2020-09-30 DIAGNOSIS — M6281 Muscle weakness (generalized): Secondary | ICD-10-CM

## 2020-09-30 DIAGNOSIS — M5412 Radiculopathy, cervical region: Secondary | ICD-10-CM

## 2020-09-30 NOTE — Therapy (Signed)
[facility] [address] [place], [address] Phone: [phone]   Fax:  [phone]  Physical Therapy Treatment  Patient Details  Name: Robyn Anderson MRN: [id number] Date of Birth: 1965/12/11 Referring Provider (PT): Girtha Hake MD   Encounter Date: 09/30/2020   PT End of Session - 09/30/20 1050     Visit Number 12    Number of Visits 17    Date for PT Re-Evaluation 09/24/20    Authorization Type [employer]    Authorization Time Period 07/30/20 - 10/22/20    PT Start Time 0936    PT Stop Time 1015    PT Time Calculation (min) 39 min    Activity Tolerance Patient tolerated treatment well;No increased pain    Behavior During Therapy WFL for tasks assessed/performed             Past Medical History:  Diagnosis Date   Barrett esophagus    Coronary artery disease    GERD (gastroesophageal reflux disease)    Headache    migraines 1-2/mo   Hypertension    Obesity (BMI 30-39.9)    Postmenopausal bleeding 01/20/2019    Past Surgical History:  Procedure Laterality Date   ATHERECTOMY Right    renal   CARPAL TUNNEL RELEASE Left    CARPAL TUNNEL RELEASE Right 09/27/2015   Procedure: OPEN CARPAL TUNNEL RELEASE RIGHT HAND;  Surgeon: Leanor Kail, MD;  Location: [facility];  Service: Orthopedics;  Laterality: Right;   CESAREAN SECTION     x2   COLONOSCOPY     ESOPHAGOGASTRODUODENOSCOPY     HYSTEROSCOPY WITH D & C N/A 04/18/2019   Procedure: DILATATION AND CURETTAGE /HYSTEROSCOPY;  Surgeon: Aletha Halim, MD;  Location: [facility];  Service: Gynecology;  Laterality: N/A;   LAPAROSCOPIC SALPINGO OOPHERECTOMY Bilateral 04/18/2019   Procedure: LAPAROSCOPIC SALPINGO OOPHORECTOMY;  Surgeon: Aletha Halim, MD;  Location: [facility];  Service: Gynecology;  Laterality: Bilateral;   TUBAL LIGATION      There were no vitals filed for this visit.   Subjective  Assessment - 09/30/20 0943     Subjective Pt reports RUE is 1/10 NPS. LUE is a 2-3/10 NPS.    Pertinent History Robyn Anderson is a pleasant 64yoF that was referred to OPPT by Dr. Alba Destine, MD for an evaluation of cervical neck pain with radicular symptoms. The onset of this condition was January 2022 with no specified mechanism of injury. Symptoms include sharp cervical neck/ RUE pain with numbness/throbbing in the thumb/index fingers. Patient had a MRI on neck 2/3 weeks ago and has an MRI scheduled for her back on April 16th.  Patient also reports chronic low back pain with R LE sciatica Patient has had surgery in B hands for carpal tunnel. Patient is L hand dominant and reports previous history of pain in L UE but does not bother as much as the R UE. 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 Patient states that symptoms are exuberated when clicking a computer mouse at work with R hand. Patient was taken out of work on March 5TH due to condition. Patient would like to return to work with decreased pain.    Limitations Lifting;House hold activities    How long can you sit comfortably? No related to neck/arm pain    How long can you stand comfortably? No related to neck/arm pain    How long can you walk  comfortably? No related to neck/arm pain    Diagnostic tests MRI spine November 2021, pt was told to have stenosis in low back as well as arthritis.    Patient Stated Goals To return to work    Currently in Pain? Yes    Pain Score 3     Pain Location Arm    Pain Orientation Right;Left    Pain Descriptors / Indicators Aching;Throbbing    Pain Onset More than a month ago    Pain Onset More than a month ago             There.ex:                       Reciprocal inhibition exercises for radial nerve tension due to cervical radiculopathy:                        LUE elbow isometric pronation holds: 2x10, 10 sec holds                         LUE shoulder IR with blue TB isometric holds: 1x10, 3 sec holds.   LUE bicep flexion isometric holds: 1x5, 8 sec holds supinated grip. 1x5, 8 sec holds with pronated grip. Pt reports worsening pain.   BUE Shoulder IR/ER: Blue TB, 2x20.             LUE resisted pronation and supination with LUE supported by bolster: 1# DB, 2x15.             LUE resisted wrist flexion/extension supported by bolster: 1# DB, 1x12   Lat pulldowns: 1x12, 10 lbs. 1x12, 15 lbs. Good form/technique.   PT Education - 09/30/20 1049     Education Details form/technique with exercise.    Person(s) Educated Patient    Methods Explanation;Demonstration;Tactile cues;Verbal cues    Comprehension Verbalized understanding;Returned demonstration              PT Short Term Goals - 09/05/20 0841       PT SHORT TERM GOAL #1   Title Patient will improve grip strength to 15 kg to be able to perform object manipulation ADL's more efficiently.    Baseline 07/30/20:10kg on 5/19: 18kg Rt, 23kg Lt    Time 3    Period Weeks    Status Achieved    Target Date 08/20/20      PT SHORT TERM GOAL #2   Title Patient will show indepedence and compliance with HEP to minimize pain and improve functional limiations.    Baseline 08/01/20: HEP given; 5/19 current and compliant    Time 3    Period Weeks    Status Achieved    Target Date 08/20/20               PT Long Term Goals - 09/23/20 0957       PT LONG TERM GOAL #1   Title Patient will improve FOTO or 63 score to increase functional capacity    Baseline 07/30/20: 53 at eval; 09/05/20: 58; 6/6: 59/66    Time 8    Period Weeks    Status On-going      PT LONG TERM GOAL #2   Title Patient will have a decrease in R UE pain to 5/10 or less to be able to return to work and perform work tasks    Baseline 07/30/20:10/10; 09/05/20: in past week has been mostly 5/10,  peaking 7/10 one day so far; 6/6: 6-7/10 prior to returning to work.    Time 8    Period Weeks    Status  On-going      PT LONG TERM GOAL #3   Title Patient will have at least 36 (previously 50) degrees of cervical extension AROM to increase mobility to perform ADL's    Baseline 07/30/20:19 degrees; 31 degrees on 09/05/20; 6/6: 46 degrees    Time 8    Period Weeks    Status Achieved    Target Date 09/23/20                   Plan - 09/30/20 1050     Clinical Impression Statement Pt continues to display improvement in LUE pain with focus on forearm extensor stretches, pronation/supination stretches, and shoulder mobility. Pt reports pain improved from 3/10 NPS to 1.5/10 NPS. Pt still requiring mod VC's/TC's for appropraite form/technique to opitimize improvement in symptoms. Pt will continue to improve from skilled PT services to address LUE pain so pt can return to PLOF.    Personal Factors and Comorbidities Time since onset of injury/illness/exacerbation;Age    Examination-Activity Limitations Lift;Carry;Other    Examination-Participation Restrictions Laundry;Cleaning;Occupation    Stability/Clinical Decision Making Stable/Uncomplicated    Rehab Potential Good    PT Frequency 2x / week    PT Duration 8 weeks    PT Treatment/Interventions Passive range of motion;Therapeutic activities;Therapeutic exercise;Patient/family education;Manual techniques    PT Next Visit Plan Reassess lifting box mechanics from floor to elevated mat table at 90 degrees. cone stacking with LUE overhead gripping tasks with focus on posture and mechanics related to work tasks.    PT Home Exercise Plan Update HEP as needed (consider adding in thoracic towel roll stretch)    Consulted and Agree with Plan of Care Patient             Patient will benefit from skilled therapeutic intervention in order to improve the following deficits and impairments:  Impaired sensation, Pain, Decreased activity tolerance, Decreased range of motion, Impaired UE functional use  Visit Diagnosis: Cervicalgia  Muscle weakness  (generalized)  Radiculopathy, cervical region     Problem List Patient Active Problem List   Diagnosis Date Noted   Migraine with aura and without status migrainosus, not intractable 04/06/2019   Neuropathy 04/06/2019   Cervical stenosis (uterine cervix) 03/31/2019   Essential hypertension 04/22/2018   Hypertriglyceridemia 04/22/2018   History of chest pain 04/22/2018   Tobacco abuse 04/22/2018    Robyn Anderson, PT, DPT Physical Therapist- [facility]  09/30/2020, 11:09 AM  [facility] [address] [place], [address] Phone: [phone]   Fax:  [phone]  Name: Robyn Anderson MRN: [id number] Date of Birth: August 13, 1965

## 2020-10-02 ENCOUNTER — Encounter: Payer: Self-pay | Admitting: Physical Therapy

## 2020-10-02 ENCOUNTER — Other Ambulatory Visit: Payer: Self-pay

## 2020-10-02 ENCOUNTER — Ambulatory Visit: Payer: Managed Care, Other (non HMO) | Admitting: Physical Therapy

## 2020-10-02 DIAGNOSIS — M542 Cervicalgia: Secondary | ICD-10-CM

## 2020-10-02 DIAGNOSIS — M6281 Muscle weakness (generalized): Secondary | ICD-10-CM

## 2020-10-02 DIAGNOSIS — M5412 Radiculopathy, cervical region: Secondary | ICD-10-CM

## 2020-10-02 NOTE — Therapy (Signed)
[facility] [address], [place], [address] Phone: [phone]   Fax:  [phone]  Physical Therapy Treatment  Patient Details  Name: Robyn Anderson MRN: [id number] Date of Birth: Feb 21, 1966 Referring Provider (PT): Girtha Hake MD   Encounter Date: 10/02/2020   PT End of Session - 10/02/20 1103     Visit Number 13    Number of Visits 17    Date for PT Re-Evaluation 09/24/20    Authorization Type [employer]    Authorization Time Period 07/30/20 - 10/22/20    PT Start Time 1100    PT Stop Time 1145    PT Time Calculation (min) 45 min    Activity Tolerance Patient tolerated treatment well;No increased pain    Behavior During Therapy WFL for tasks assessed/performed             Past Medical History:  Diagnosis Date   Barrett esophagus    Coronary artery disease    GERD (gastroesophageal reflux disease)    Headache    migraines 1-2/mo   Hypertension    Obesity (BMI 30-39.9)    Postmenopausal bleeding 01/20/2019    Past Surgical History:  Procedure Laterality Date   ATHERECTOMY Right    renal   CARPAL TUNNEL RELEASE Left    CARPAL TUNNEL RELEASE Right 09/27/2015   Procedure: OPEN CARPAL TUNNEL RELEASE RIGHT HAND;  Surgeon: Leanor Kail, MD;  Location: [facility];  Service: Orthopedics;  Laterality: Right;   CESAREAN SECTION     x2   COLONOSCOPY     ESOPHAGOGASTRODUODENOSCOPY     HYSTEROSCOPY WITH D & C N/A 04/18/2019   Procedure: DILATATION AND CURETTAGE /HYSTEROSCOPY;  Surgeon: Aletha Halim, MD;  Location: [facility];  Service: Gynecology;  Laterality: N/A;   LAPAROSCOPIC SALPINGO OOPHERECTOMY Bilateral 04/18/2019   Procedure: LAPAROSCOPIC SALPINGO OOPHORECTOMY;  Surgeon: Aletha Halim, MD;  Location: [facility];  Service: Gynecology;  Laterality: Bilateral;   TUBAL LIGATION      There were no vitals filed for this visit.   Subjective  Assessment - 10/02/20 1102     Subjective Pt reports that her left arm is bothering her today, because she had a long shift yesterday.    Pertinent History Robyn Anderson is a pleasant 61yoF that was referred to OPPT by Dr. Alba Destine, MD for an evaluation of cervical neck pain with radicular symptoms. The onset of this condition was January 2022 with no specified mechanism of injury. Symptoms include sharp cervical neck/ RUE pain with numbness/throbbing in the thumb/index fingers. Patient had a MRI on neck 2/3 weeks ago and has an MRI scheduled for her back on April 16th.  Patient also reports chronic low back pain with R LE sciatica Patient has had surgery in B hands for carpal tunnel. Patient is L hand dominant and reports previous history of pain in L UE but does not bother as much as the R UE. 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 Patient states that symptoms are exuberated when clicking a computer mouse at work with R hand. Patient was taken out of work on March 5TH due to condition. Patient would like to return to work with decreased pain.    Limitations Lifting;House hold activities    How long can you sit comfortably? No related to neck/arm pain    How long can you stand comfortably? No related to neck/arm pain  How long can you walk comfortably? No related to neck/arm pain    Diagnostic tests MRI spine November 2021, pt was told to have stenosis in low back as well as arthritis.    Patient Stated Goals To return to work    Currently in Pain? Yes    Pain Score 10-Worst pain ever    Pain Location Arm    Pain Orientation Left;Right    Pain Descriptors / Indicators Aching    Pain Type Chronic pain    Pain Onset More than a month ago    Pain Frequency Constant    Pain Onset More than a month ago             MANUAL THERAPY:  Left Lateral Epicondylitis and forearm extensor massage- 5 min   THEREX:  Forearm  Extensor Stretch 5 x 30 sec   Wrist extension eccentric #1 DB 2 x10  -Vcs to slowly lower DB into wrist flexion   Seated Chin Tucks 2x10   Seated Rows Green TB 2x10  -Vcs to look straight ahead      PT Short Term Goals - 10/02/20 1619       PT SHORT TERM GOAL #1   Title Patient will improve grip strength to 15 kg to be able to perform object manipulation ADL's more efficiently.    Baseline 07/30/20:10kg on 5/19: 18kg Rt, 23kg Lt    Time 3    Period Weeks    Status Achieved    Target Date 08/20/20      PT SHORT TERM GOAL #2   Title Patient will show indepedence and compliance with HEP to minimize pain and improve functional limiations.    Baseline 08/01/20: HEP given; 5/19 current and compliant    Time 3    Period Weeks    Status Achieved    Target Date 08/20/20               PT Long Term Goals - 10/02/20 1619       PT LONG TERM GOAL #1   Title Patient will improve FOTO or 63 score to increase functional capacity    Baseline 07/30/20: 53 at eval; 09/05/20: 58; 6/6: 59/66    Time 8    Period Weeks    Status On-going      PT LONG TERM GOAL #2   Title Patient will have a decrease in R UE pain to 5/10 or less to be able to return to work and perform work tasks    Baseline 07/30/20:10/10; 09/05/20: in past week has been mostly 5/10, peaking 7/10 one day so far; 6/6: 6-7/10 prior to returning to work.    Time 8    Period Weeks    Status On-going      PT LONG TERM GOAL #3   Title Patient will have at least 36 (previously 50) degrees of cervical extension AROM to increase mobility to perform ADL's    Baseline 07/30/20:19 degrees; 31 degrees on 09/05/20; 6/6: 46 degrees    Time 8    Period Weeks    Status Achieved                   Plan - 10/02/20 1637     Clinical Impression Statement Pt's session limited by cervical and forearm pain throughout session because of working extended hours. She is unable to take medications before sessions because of risk of  having an accident with driving and sleepiness. Pt did demonstrate a decrease  in pain from beginning to end of session with reduction from 10/10 pain to 7/10 for LUE. Pt will continue to benefit from PT to decrease cervical and forearm pain in order to carry out job related activities pain free.    Personal Factors and Comorbidities Time since onset of injury/illness/exacerbation;Age    Examination-Activity Limitations Lift;Carry;Other    Examination-Participation Restrictions Laundry;Cleaning;Occupation    Stability/Clinical Decision Making Stable/Uncomplicated    Rehab Potential Good    PT Frequency 2x / week    PT Duration 8 weeks    PT Treatment/Interventions Passive range of motion;Therapeutic activities;Therapeutic exercise;Patient/family education;Manual techniques    PT Next Visit Plan Reassess lifting box mechanics from floor to elevated mat table at 90 degrees. cone stacking with LUE overhead gripping tasks with focus on posture and mechanics related to work tasks.    PT Home Exercise Plan Update HEP as needed (consider adding in thoracic towel roll stretch)    Consulted and Agree with Plan of Care Patient            HEP includes following exercises:   Access Code: [email or web address] URL: [email or web address] Date: 10/02/2020 Prepared by: Bradly Chris  Exercises Seated Eccentric Wrist Extension - 1 x daily - 7 x weekly - 3 sets - 10 reps Seated Cervical Retraction - 1 x daily - 7 x weekly - 3 sets - 10 reps Standing Wrist Extensor Stretch with Arm Bent - 1 x daily - 7 x weekly - 1 sets - 5 reps - 30 hold Seated Cervical Sidebending Stretch - 1 x daily - 7 x weekly - 1 sets - 5 reps - 30 hold Seated Shoulder Row with Anchored Resistance - 1 x daily - 7 x weekly - 3 sets - 10 reps Shoulder Internal Rotation with Resistance - 1 x daily - 7 x weekly - 3 sets - 10 reps    Patient will benefit from skilled therapeutic intervention in order to improve the following  deficits and impairments:  Impaired sensation, Pain, Decreased activity tolerance, Decreased range of motion, Impaired UE functional use  Visit Diagnosis: Cervicalgia  Muscle weakness (generalized)  Radiculopathy, cervical region     Problem List Patient Active Problem List   Diagnosis Date Noted   Migraine with aura and without status migrainosus, not intractable 04/06/2019   Neuropathy 04/06/2019   Cervical stenosis (uterine cervix) 03/31/2019   Essential hypertension 04/22/2018   Hypertriglyceridemia 04/22/2018   History of chest pain 04/22/2018   Tobacco abuse 04/22/2018   Bradly Chris PT, DPT   10/02/2020, 4:47 PM  [facility] [address], [place], [address] Phone: [phone]   Fax:  [phone]  Name: Robyn Anderson MRN: [id number] Date of Birth: [birth year]

## 2020-10-05 IMAGING — CT CT RENAL STONE PROTOCOL
2 of 4 series · 17 of 46 positions shown, 19 images · non-contrast
Comparison: 09/07/2016 CT

CLINICAL DATA: 53-year-old female with acute LEFT abdominal and
flank pain with hematuria.

EXAM:
CT ABDOMEN AND PELVIS WITHOUT CONTRAST
TECHNIQUE: Multidetector CT imaging of the abdomen and pelvis was performed
following the standard protocol without IV contrast.

[Series 3: stone study 5.0 i30f 2 · axial · 0.89mm/px · z∈[+590,+990]mm · 14 of 88 slices shown, 16 images]
[im 4/88  soft-tissue]
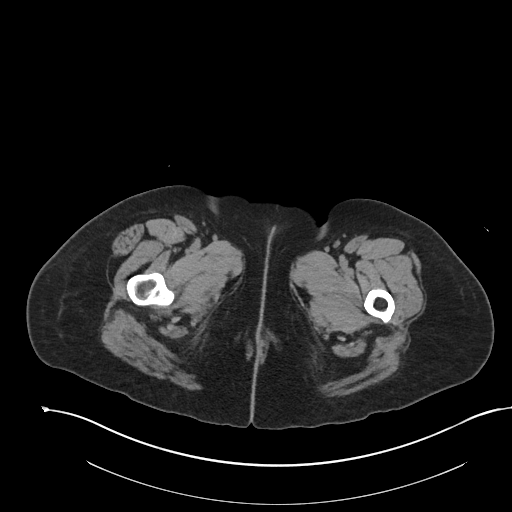
[im 4/88  bone]
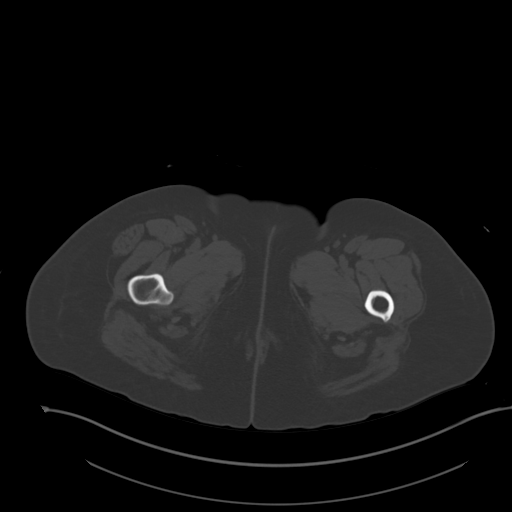
[im 11/88  soft-tissue]
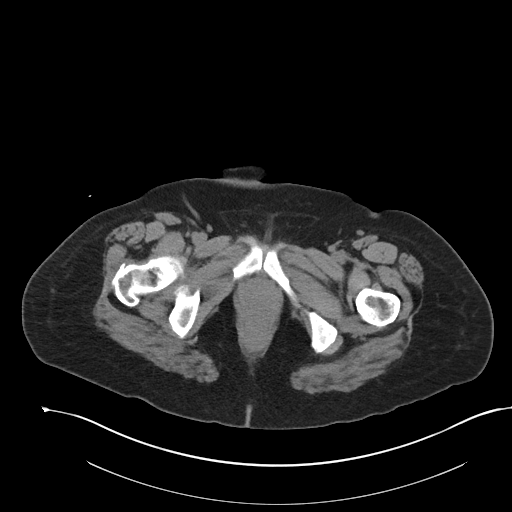
[im 19/88  soft-tissue]
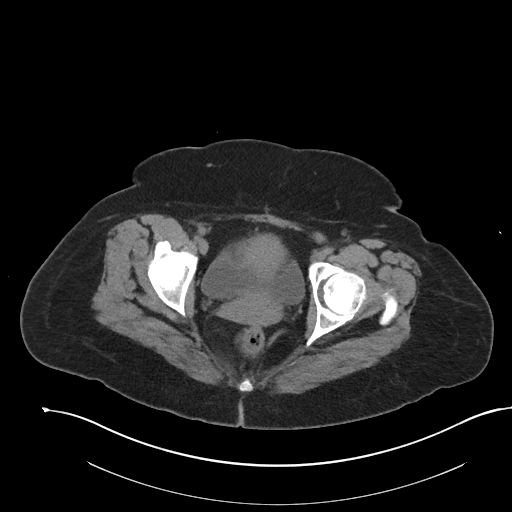
[im 22/88  soft-tissue]
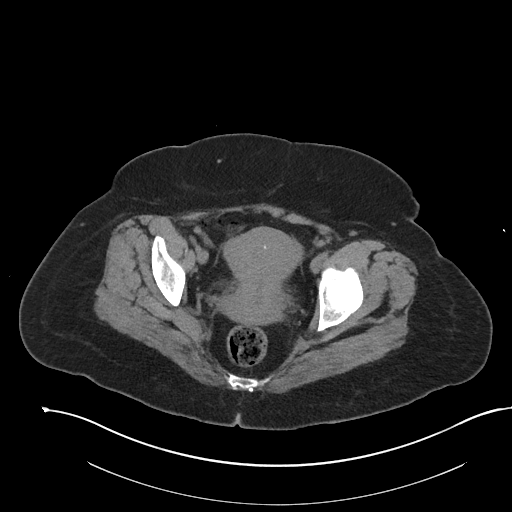
[im 30/88  soft-tissue]
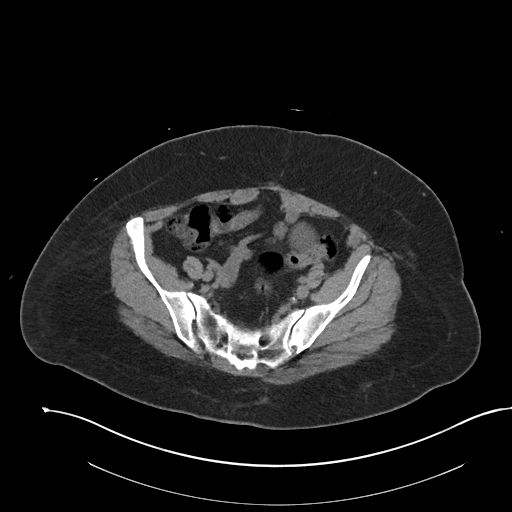
[im 37/88  soft-tissue]
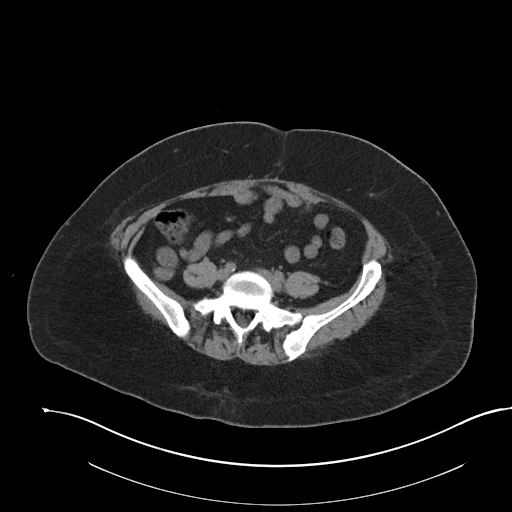
[im 40/88  soft-tissue]
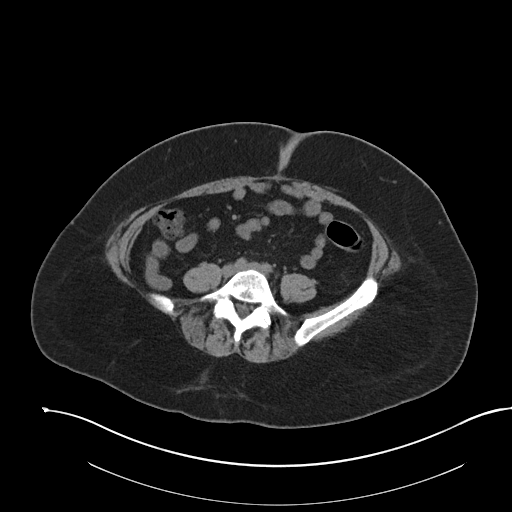
[im 48/88  soft-tissue]
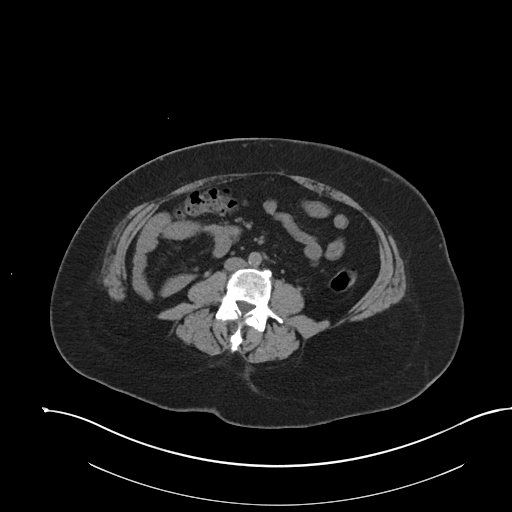
[im 51/88  soft-tissue]
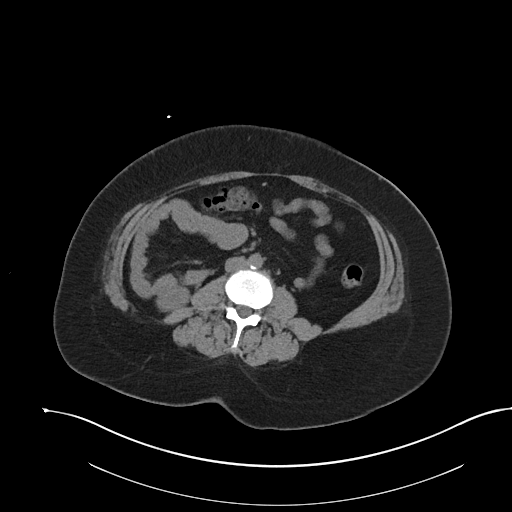
[im 51/88  bone]
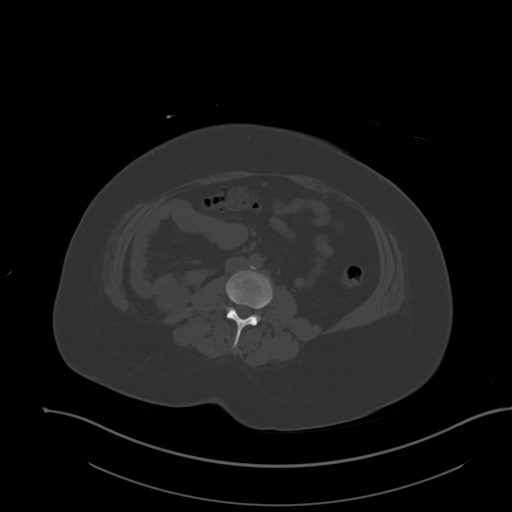
[im 59/88  soft-tissue]
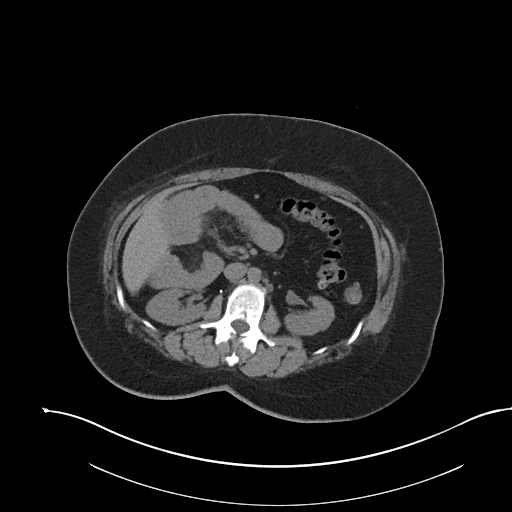
[im 66/88  soft-tissue]
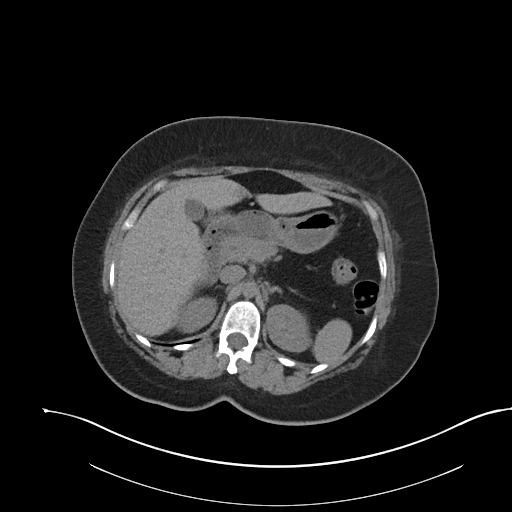
[im 69/88  soft-tissue]
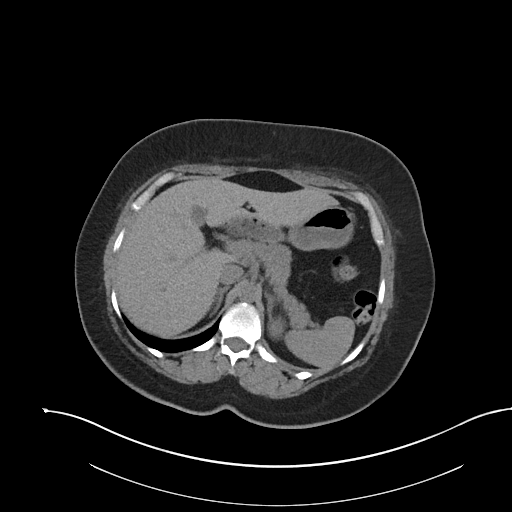
[im 77/88  soft-tissue]
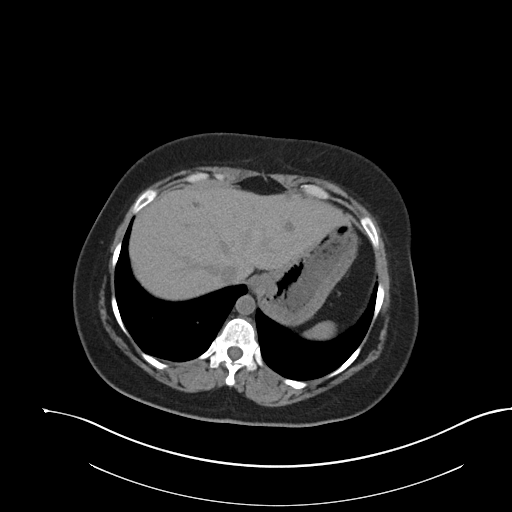
[im 84/88  soft-tissue]
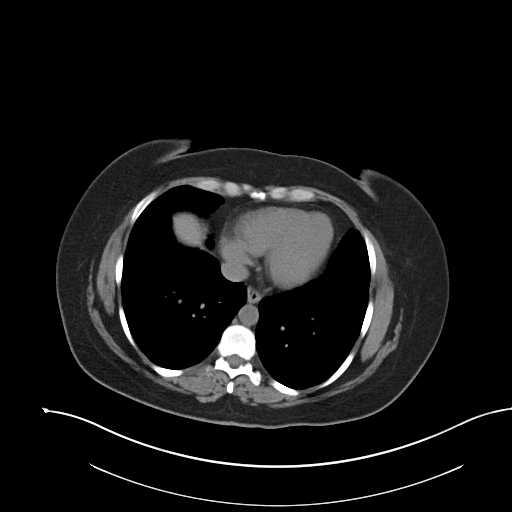

[Series 6: coronal soft tissue · coronal · 0.82mm/px · 3 of 100 slices shown]
[im 34/100  soft-tissue]
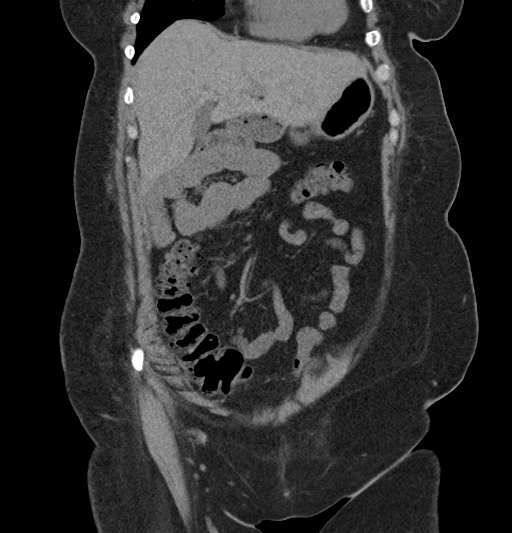
[im 45/100  soft-tissue]
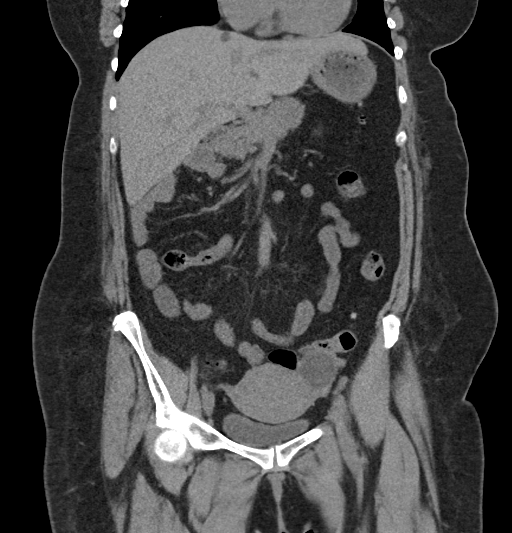
[im 56/100  soft-tissue]
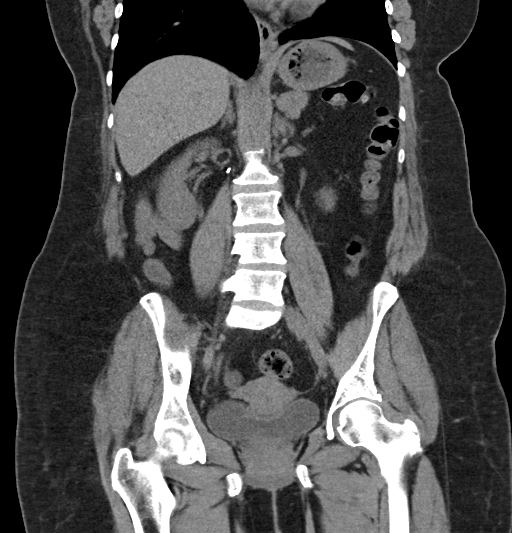

[17 of 46 positions shown; findings below may reference images not displayed]

FINDINGS: Lower chest: No acute abnormality.

Hepatobiliary: The liver and gallbladder are unremarkable except for
unchanged hepatic cysts. No biliary dilatation.

Pancreas: Unremarkable

Spleen: Unremarkable

Adrenals/Urinary Tract: The kidneys, adrenal glands and bladder are
unremarkable.

Stomach/Bowel: Stomach is within normal limits. Appendix appears
normal. No evidence of bowel wall thickening, distention, or
inflammatory changes.

Vascular/Lymphatic: Aortic atherosclerosis. No enlarged abdominal or
pelvic lymph nodes.

Reproductive: A 4 cm LEFT adnexal/ovarian cyst is present without
adjacent inflammation or free fluid. The uterus and RIGHT adnexal
region are unremarkable.

Other: No ascites, focal collection or pneumoperitoneum.

Musculoskeletal: No acute or suspicious bony abnormality.
IMPRESSION: 1. 4 mm LEFT adnexal/ovarian cyst without adjacent inflammation or
free fluid. Consider ultrasound if pain is referable to this area.
2.  Aortic Atherosclerosis ([id number]).

## 2020-10-07 ENCOUNTER — Ambulatory Visit: Payer: Managed Care, Other (non HMO) | Admitting: Physical Therapy

## 2020-10-09 ENCOUNTER — Ambulatory Visit: Payer: Managed Care, Other (non HMO) | Admitting: Physical Therapy

## 2020-10-14 ENCOUNTER — Encounter: Payer: Self-pay | Admitting: Physical Therapy

## 2020-10-14 ENCOUNTER — Ambulatory Visit: Payer: Managed Care, Other (non HMO) | Admitting: Physical Therapy

## 2020-10-14 DIAGNOSIS — M5412 Radiculopathy, cervical region: Secondary | ICD-10-CM

## 2020-10-14 DIAGNOSIS — M542 Cervicalgia: Secondary | ICD-10-CM

## 2020-10-14 NOTE — Therapy (Signed)
[facility] [address], [place], [address] Phone: [phone]   Fax:  [phone]  Physical Therapy Treatment  Patient Details  Name: Robyn Anderson MRN: [id number] Date of Birth: 08-17-1965 Referring Provider (PT): Girtha Hake MD   Encounter Date: 10/14/2020   PT End of Session - 10/14/20 1724     Visit Number 14    Number of Visits 17    Date for PT Re-Evaluation 09/24/20    Authorization Type [employer]    Authorization Time Period 07/30/20 - 10/22/20    PT Start Time 0930    PT Stop Time 0569    PT Time Calculation (min) 45 min    Activity Tolerance Patient tolerated treatment well;No increased pain    Behavior During Therapy WFL for tasks assessed/performed             Past Medical History:  Diagnosis Date   Barrett esophagus    Coronary artery disease    GERD (gastroesophageal reflux disease)    Headache    migraines 1-2/mo   Hypertension    Obesity (BMI 30-39.9)    Postmenopausal bleeding 01/20/2019    Past Surgical History:  Procedure Laterality Date   ATHERECTOMY Right    renal   CARPAL TUNNEL RELEASE Left    CARPAL TUNNEL RELEASE Right 09/27/2015   Procedure: OPEN CARPAL TUNNEL RELEASE RIGHT HAND;  Surgeon: Leanor Kail, MD;  Location: [facility];  Service: Orthopedics;  Laterality: Right;   CESAREAN SECTION     x2   COLONOSCOPY     ESOPHAGOGASTRODUODENOSCOPY     HYSTEROSCOPY WITH D & C N/A 04/18/2019   Procedure: DILATATION AND CURETTAGE /HYSTEROSCOPY;  Surgeon: Aletha Halim, MD;  Location: [facility];  Service: Gynecology;  Laterality: N/A;   LAPAROSCOPIC SALPINGO OOPHERECTOMY Bilateral 04/18/2019   Procedure: LAPAROSCOPIC SALPINGO OOPHORECTOMY;  Surgeon: Aletha Halim, MD;  Location: [facility];  Service: Gynecology;  Laterality: Bilateral;   TUBAL LIGATION      There were no vitals filed for this visit.   Subjective  Assessment - 10/14/20 0933     Subjective Pt reports that her left arm is feeling better than it did last week. She reports that it feels better on Monday, because she does not work on weekends.    Pertinent History Robyn Anderson is a pleasant 55yoF that was referred to OPPT by Dr. Alba Destine, MD for an evaluation of cervical neck pain with radicular symptoms. The onset of this condition was January 2022 with no specified mechanism of injury. Symptoms include sharp cervical neck/ RUE pain with numbness/throbbing in the thumb/index fingers. Patient had a MRI on neck 2/3 weeks ago and has an MRI scheduled for her back on April 16th.  Patient also reports chronic low back pain with R LE sciatica Patient has had surgery in B hands for carpal tunnel. Patient is L hand dominant and reports previous history of pain in L UE but does not bother as much as the R UE. 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 Patient states that symptoms are exuberated when clicking a computer mouse at work with R hand. Patient was taken out of work on March 5TH due to condition. Patient would like to return to work with decreased pain.    Limitations Lifting;House hold activities    How long can you sit comfortably? No related to neck/arm pain    How  long can you stand comfortably? No related to neck/arm pain    How long can you walk comfortably? No related to neck/arm pain    Diagnostic tests MRI spine November 2021, pt was told to have stenosis in low back as well as arthritis.    Patient Stated Goals To return to work    Currently in Pain? Yes    Pain Score 2     Pain Location Arm    Pain Orientation Right    Pain Descriptors / Indicators Aching    Pain Type Chronic pain    Pain Radiating Towards R thumb and index fingers    Pain Onset More than a month ago    Pain Score 4    Pain Location Arm    Pain Orientation Left    Pain Descriptors / Indicators  Aching    Pain Onset More than a month ago            THEREX:  Forearm Extensor Stretch 5 x 30 sec   Radial Nerve Glides 2 x 10  -Vcs on sequence of movement  -Pt reports relief of numbness and tingling    Resisted wrist extension with green therband  -Vcs to perform flexion slowly   Resisted wrist extension isometric 5 x 30 sec  -Vcs to place wrist in increased extension   Discussion about workplace modification and adjusting work setup to decrease  -Repositioning of mouse pad from right to left side    Updated HEP and educated patient on changes to exercises and addition of new exercises      PT Education - 10/14/20 1723     Education Details form/technique with exercise    Person(s) Educated Patient    Methods Explanation;Demonstration    Comprehension Verbalized understanding;Returned demonstration;Tactile cues required;Verbal cues required              PT Short Term Goals - 10/14/20 1734       PT SHORT TERM GOAL #1   Title Patient will improve grip strength to 15 kg to be able to perform object manipulation ADL's more efficiently.    Baseline 07/30/20:10kg on 5/19: 18kg Rt, 23kg Lt    Time 3    Period Weeks    Status Achieved    Target Date 08/20/20      PT SHORT TERM GOAL #2   Title Patient will show indepedence and compliance with HEP to minimize pain and improve functional limiations.    Baseline 08/01/20: HEP given; 5/19 current and compliant    Time 3    Period Weeks    Status Achieved    Target Date 08/20/20               PT Long Term Goals - 10/14/20 1733       PT LONG TERM GOAL #1   Title Patient will improve FOTO or 63 score to increase functional capacity    Baseline 07/30/20: 53 at eval; 09/05/20: 58; 6/6: 59/66    Time 8    Period Weeks    Status On-going      PT LONG TERM GOAL #2   Title Patient will have a decrease in R UE pain to 5/10 or less to be able to return to work and perform work tasks    Baseline 07/30/20:10/10;  09/05/20: in past week has been mostly 5/10, peaking 7/10 one day so far; 6/6: 6-7/10 prior to returning to work.    Time 8    Period  Weeks    Status On-going      PT LONG TERM GOAL #3   Title Patient will have at least 36 (previously 50) degrees of cervical extension AROM to increase mobility to perform ADL's    Baseline 07/30/20:19 degrees; 31 degrees on 09/05/20; 6/6: 46 degrees    Time 8    Period Weeks    Status Achieved                Plan - 10/14/20 1733     Clinical Impression Statement Pt is a 55 yo female that presents for follow-up for bilateral lateral epicondylitis and right sided cervical radiculopathy. Pt is still significantly limited by pain in bilateral forearms. While she was able to tolerate all forearm eccentric and mobility exercises, she did struggle to complete strengthening exercises due to pain. Pt demonstrated relieve of numbness and tingling with radial glides for right arm. Pt educated on modifying environment, but her work environment has been modified as much as possible. Pt will continue to benefit from skilled PT for strenghtening exercises and nerve mobility to decrease pain and her symptoms to be able to better tolerate extensive forearm work related tasks.    Personal Factors and Comorbidities Time since onset of injury/illness/exacerbation;Age    Examination-Activity Limitations Lift;Carry;Other    Examination-Participation Restrictions Laundry;Cleaning;Occupation    Stability/Clinical Decision Making Stable/Uncomplicated    Rehab Potential Good    PT Frequency 2x / week    PT Duration 8 weeks    PT Treatment/Interventions Passive range of motion;Therapeutic activities;Therapeutic exercise;Patient/family education;Manual techniques    PT Next Visit Plan Tennis Elbow Brace, radial head glides, eccentric forearm progression    PT Home Exercise Plan [email or web address]    Consulted and Agree with Plan of Care Patient            Access Code: [email or web address] URL:  [email or web address] Date: 10/14/2020 Prepared by: Bradly Chris  Exercises Seated Wrist Flexion Active Stretch Pronated with Elbow Straight - 1 x daily - 7 x weekly - 1 sets - 5 reps - 30 hold Radial Nerve Flossing - 1 x daily - 7 x weekly - 3 sets - 10 reps - 2 hold Eccentric Wrist Extension with Resistance - 1 x daily - 3 x weekly - 2 sets - 10 reps Isometric Wrist Extension Pronated - 1 x daily - 3 x weekly - 1 sets - 5 reps - 30 hold  Patient will benefit from skilled therapeutic intervention in order to improve the following deficits and impairments:  Impaired sensation, Pain, Decreased activity tolerance, Decreased range of motion, Impaired UE functional use  Visit Diagnosis: Cervicalgia  Radiculopathy, cervical region     Problem List Patient Active Problem List   Diagnosis Date Noted   Migraine with aura and without status migrainosus, not intractable 04/06/2019   Neuropathy 04/06/2019   Cervical stenosis (uterine cervix) 03/31/2019   Essential hypertension 04/22/2018   Hypertriglyceridemia 04/22/2018   History of chest pain 04/22/2018   Tobacco abuse 04/22/2018   Bradly Chris PT, DPT  10/14/2020, 5:43 PM  [facility] [address] [place], [address] Phone: [phone]   Fax:  [phone]  Name: Robyn Anderson MRN: [id number] Date of Birth: 02/05/1966

## 2020-10-16 ENCOUNTER — Ambulatory Visit: Payer: Managed Care, Other (non HMO) | Admitting: Physical Therapy

## 2020-10-22 ENCOUNTER — Ambulatory Visit: Payer: Managed Care, Other (non HMO) | Admitting: Physical Therapy

## 2020-10-25 ENCOUNTER — Ambulatory Visit: Payer: Managed Care, Other (non HMO) | Attending: Physical Medicine & Rehabilitation | Admitting: Physical Therapy

## 2020-10-25 DIAGNOSIS — M5412 Radiculopathy, cervical region: Secondary | ICD-10-CM | POA: Insufficient documentation

## 2020-10-25 DIAGNOSIS — M542 Cervicalgia: Secondary | ICD-10-CM | POA: Diagnosis not present
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
## 2020-10-29 ENCOUNTER — Ambulatory Visit: Payer: Managed Care, Other (non HMO) | Admitting: Physical Therapy

## 2020-10-29 DIAGNOSIS — M5412 Radiculopathy, cervical region: Secondary | ICD-10-CM

## 2020-10-29 DIAGNOSIS — M542 Cervicalgia: Secondary | ICD-10-CM | POA: Diagnosis not present

## 2020-10-29 NOTE — Therapy (Addendum)
[facility] [address] [place], [address] Phone: [phone]   Fax:  [phone]  Physical Therapy Discharge Summary Reporting Period: 07/30/20-10/29/20 Patient Details  Name: Robyn Anderson MRN: [id number] Date of Birth: [birth year] Referring Provider (PT): Girtha Hake MD   Encounter Date: 10/29/2020     Past Medical History:  Diagnosis Date   Barrett esophagus    Coronary artery disease    GERD (gastroesophageal reflux disease)    Headache    migraines 1-2/mo   Hypertension    Obesity (BMI 30-39.9)    Postmenopausal bleeding 01/20/2019    Past Surgical History:  Procedure Laterality Date   ATHERECTOMY Right    renal   CARPAL TUNNEL RELEASE Left    CARPAL TUNNEL RELEASE Right 09/27/2015   Procedure: OPEN CARPAL TUNNEL RELEASE RIGHT HAND;  Surgeon: Leanor Kail, MD;  Location: [facility];  Service: Orthopedics;  Laterality: Right;   CESAREAN SECTION     x2   COLONOSCOPY     ESOPHAGOGASTRODUODENOSCOPY     HYSTEROSCOPY WITH D & C N/A 04/18/2019   Procedure: DILATATION AND CURETTAGE /HYSTEROSCOPY;  Surgeon: Aletha Halim, MD;  Location: [facility];  Service: Gynecology;  Laterality: N/A;   LAPAROSCOPIC SALPINGO OOPHERECTOMY Bilateral 04/18/2019   Procedure: LAPAROSCOPIC SALPINGO OOPHORECTOMY;  Surgeon: Aletha Halim, MD;  Location: [facility];  Service: Gynecology;  Laterality: Bilateral;   TUBAL LIGATION      There were no vitals filed for this visit.    THEREX:  Fitted Tennis Elbow Strap on left arm and instructed pt on how to do it independently.   Education about using ergonomic mouse   Forearm Extensor Stretch 5 x 30 sec on RUE + BUE   Resisted wrist extension with green therband 2 X 10  -Vcs to perform flexion slowly   Resisted pronation and supination 3 x 10 with #3 lbs  -Vcs to use wrist instead of arm   Updated HEP and  educated patient on changes to exercises and addition of new exercises      PT Short Term Goals - 10/29/20 1001       PT SHORT TERM GOAL #1   Title Patient will improve grip strength to 15 kg to be able to perform object manipulation ADL's more efficiently.    Baseline 07/30/20:10kg on 5/19: 18kg Rt, 23kg Lt    Time 3    Period Weeks    Status Achieved    Target Date 08/20/20      PT SHORT TERM GOAL #2   Title Patient will show indepedence and compliance with HEP to minimize pain and improve functional limiations.    Baseline 08/01/20: HEP given; 5/19 current and compliant    Time 3    Period Weeks    Status Achieved    Target Date 08/20/20               PT Long Term Goals - 10/29/20 1001       PT LONG TERM GOAL #1   Title Patient will improve FOTO or 63 score to increase functional capacity    Baseline 07/30/20: 53 at eval; 09/05/20: 58; 6/6: 59/66 7/8: 61    Time 4    Period Weeks    Status Not Met      PT LONG TERM GOAL #2   Title Patient will have a decrease in R UE pain to 5/10 or less to be able to  return to work and perform work tasks    Baseline 07/30/20:10/10; 09/05/20: in past week has been mostly 5/10, peaking 7/10 one day so far; 6/6: 6-7/10 prior to returning to work. 7/8: Still experiencing 5/10 in RUE work    Time 4    Period Weeks    Status Not Met      PT LONG TERM GOAL #3   Title Patient will have at least 36 (previously 50) degrees of cervical extension AROM to increase mobility to perform ADL's    Baseline 07/30/20:19 degrees; 31 degrees on 09/05/20; 6/6: 46 degrees    Time 8    Period Weeks    Status Achieved              Plan - 11/01/20 1249     Clinical Impression Statement Pt presents for f/u for bilateral elbow pain. She continues to demonstrate limited progress towards resolution of pain especially given heavy upper extremity use for job demands and lack of off time to rest forearm extensor tendons. Pt educated on using tennis elbow  brace and ergonomic mouse to reduce demand on extensor tendons. Pt is meeting with her physiatrist this week for ongoing management and then she will be meeting with neuromuscular physician at the end of month to discuss ongoing management of ongoing cerivcal radicular symptoms. After further discussion with PCP, patient has determined that she would no longer like to pursue PT for her underlying deficits. This decision is in line with discussion that PT and pt about approaching visit number where conservative management of laterl epicondylitis and cervical radiculopathy had been exhausted. However, given that call to discontinue PT was not taken by this PT and by secretary, it is unclear about exactly why pt discontinued PT.    Personal Factors and Comorbidities Time since onset of injury/illness/exacerbation;Age    Examination-Activity Limitations Lift;Carry;Other    Examination-Participation Restrictions Laundry;Cleaning;Occupation    Stability/Clinical Decision Making Stable/Uncomplicated    Rehab Potential Good    PT Frequency 2x / week    PT Duration 8 weeks    PT Treatment/Interventions Passive range of motion;Therapeutic activities;Therapeutic exercise;Patient/family education;Manual techniques    PT Next Visit Plan N/a. pt request to d/c from PT.    [place] and Agree with Plan of Care Patient                   Access Code: [email or web address] URL: [email or web address] Date: 10/29/2020 Prepared by: Bradly Chris  Exercises Seated Wrist Flexion Active Stretch Pronated with Elbow Straight - 1 x daily - 7 x weekly - 1 sets - 5 reps - 30 hold Radial Nerve Flossing - 1 x daily - 7 x weekly - 3 sets - 10 reps - 2 hold Eccentric Wrist Extension with Resistance - 1 x daily - 3 x weekly - 3 sets - 10 reps Forearm Pronation with Dumbbell - 1 x daily - 3 x weekly - 3 sets - 10 reps    Patient will benefit from skilled therapeutic  intervention in order to improve the following deficits and impairments:  Impaired sensation, Pain, Decreased activity tolerance, Decreased range of motion, Impaired UE functional use  Visit Diagnosis: Cervicalgia  Radiculopathy, cervical region     Problem List Patient Active Problem List   Diagnosis Date Noted   Migraine with aura and without status migrainosus, not intractable 04/06/2019   Neuropathy 04/06/2019   Cervical stenosis (uterine cervix) 03/31/2019   Essential hypertension 04/22/2018  Hypertriglyceridemia 04/22/2018   History of chest pain 04/22/2018   Tobacco abuse 04/22/2018   Bradly Chris PT, DPT  11/01/2020, 12:47 PM  [facility] [address], [place], [address] Phone: [phone]   Fax:  [phone]  Name: PEGGI YONO MRN: [id number] Date of Birth: [birth year]

## 2020-10-31 ENCOUNTER — Ambulatory Visit: Payer: Managed Care, Other (non HMO) | Admitting: Physical Therapy

## 2020-10-31 ENCOUNTER — Telehealth: Payer: Self-pay | Admitting: Physical Therapy

## 2020-10-31 NOTE — Telephone Encounter (Signed)
Left VM instructing pt to call back to discuss recent news about physician stating that she could discharge from PT and for her to take [place].

## 2020-11-05 ENCOUNTER — Encounter: Payer: Managed Care, Other (non HMO) | Admitting: Physical Therapy

## 2020-11-07 ENCOUNTER — Encounter: Payer: Managed Care, Other (non HMO) | Admitting: Physical Therapy

## 2020-11-08 ENCOUNTER — Other Ambulatory Visit: Payer: Self-pay

## 2020-11-08 ENCOUNTER — Encounter (HOSPITAL_COMMUNITY): Payer: Self-pay

## 2020-11-08 ENCOUNTER — Emergency Department (HOSPITAL_COMMUNITY): Payer: Managed Care, Other (non HMO)

## 2020-11-08 ENCOUNTER — Emergency Department (HOSPITAL_COMMUNITY)
Admission: EM | Admit: 2020-11-08 | Discharge: 2020-11-08 | Disposition: A | Discharge status: Home / Self Care | Payer: Managed Care, Other (non HMO) | Attending: Emergency Medicine | Admitting: Emergency Medicine

## 2020-11-08 DIAGNOSIS — R531 Weakness: Secondary | ICD-10-CM | POA: Diagnosis not present

## 2020-11-08 DIAGNOSIS — I2510 Atherosclerotic heart disease of native coronary artery without angina pectoris: Secondary | ICD-10-CM | POA: Diagnosis not present

## 2020-11-08 DIAGNOSIS — F17210 Nicotine dependence, cigarettes, uncomplicated: Secondary | ICD-10-CM | POA: Insufficient documentation

## 2020-11-08 DIAGNOSIS — I10 Essential (primary) hypertension: Secondary | ICD-10-CM | POA: Diagnosis not present

## 2020-11-08 DIAGNOSIS — Z79899 Other long term (current) drug therapy: Secondary | ICD-10-CM | POA: Insufficient documentation

## 2020-11-08 DIAGNOSIS — R202 Paresthesia of skin: Secondary | ICD-10-CM | POA: Diagnosis present

## 2020-11-08 MED ORDER — KETOROLAC TROMETHAMINE 15 MG/ML IJ SOLN
15.0000 mg | Freq: Once | INTRAMUSCULAR | Status: AC
  Administered 2020-11-08: 15 mg via INTRAMUSCULAR
  Filled 2020-11-08: qty 1

## 2020-11-08 NOTE — ED Triage Notes (Signed)
Pt c/o numbness in her hands and feet. Pt also reports pain in her neck. Pt states she had a steroid injection in her neck on Monday for pinched nerves.

## 2020-11-08 NOTE — Discharge Instructions (Addendum)
The CT scan of your neck does not show any change from prior exams.  Your exam here is normal.  You do not appear to have any weakness on 1 side or the other or difficult loss of sensation.  Please return if you have worsening symptoms.  Otherwise, please be reevaluated by your doctor early next week.

## 2020-11-08 NOTE — ED Provider Notes (Signed)
Emergency Medicine Provider Triage Evaluation Note  Robyn Anderson , a 55 y.o. female  was evaluated in triage.  Pt complains of pain to neck and RUE since epidural steroid injection in her neck on Monday, 11/04/20. Has been having cervicalgia and R arm pain since January 2022. Patient states that pain is constant, but seems worse since her procedure. Has taken her prescribed Gabapentin without relief. Also reporting subjective decreased sensation in b/l feet. No incontinence, low back pain, genital or perianal numbness, hx of CA or IVDU.  Review of Systems  Positive: Neck and RUE pain Negative: Incontinence, fever, low back pain  Physical Exam  BP 117/65 (BP Location: Left Arm)   Pulse 70   Temp 98.3 F (36.8 C) (Oral)   Resp 18   Ht '4\' 9"'$  (1.448 m)   Wt 67.1 kg   LMP 07/30/2017   SpO2 100%   BMI 32.03 kg/m  Gen:   Awake, no distress  Resp:  Normal effort  MSK:   Moves extremities without difficulty  Other:  No focal deficits noted on exam. Equal grips strength and strength against resistance in all major muscle groups bilaterally. Ambulatory without assistance.  Medical Decision Making  Medically screening exam initiated at 3:51 AM.  Appropriate orders placed.  GUADLUPE HOLTAN was informed that the remainder of the evaluation will be completed by another provider, this initial triage assessment does not replace that evaluation, and the importance of remaining in the ED until their evaluation is complete.  Cervicalgia    Antonietta Breach, PA-C 11/08/20 0354    Margette Fast, MD 11/08/20 [id number]

## 2020-11-08 NOTE — ED Provider Notes (Signed)
[facility] EMERGENCY DEPARTMENT Provider Note   CSN: [id number] Arrival date & time: 11/08/20  0300     History Chief Complaint  Patient presents with   Numbness    Robyn Anderson is a 55 y.o. female.  HPI 55 year old female history of hypertension, obesity, Barrett's esophagus, cervical disease, status postinjection Monday, presents today with her numbness.  She states that Wednesday she noted some numbness in all of her toes.  She also feels some generalized right-sided weakness.  She has not had any difficulty speaking, seeing, or walking.  She denies any headache, head injury, increased neck pain although she has ongoing chronic neck pain, and she has ongoing numbness in her right hand since January.  She has not had any fever, chills, chest pain, dyspnea, abdominal pain, nausea, vomiting, diarrhea, leg swelling, history of PE.     Past Medical History:  Diagnosis Date   Barrett esophagus    Coronary artery disease    GERD (gastroesophageal reflux disease)    Headache    migraines 1-2/mo   Hypertension    Obesity (BMI 30-39.9)    Postmenopausal bleeding 01/20/2019    Patient Active Problem List   Diagnosis Date Noted   Migraine with aura and without status migrainosus, not intractable 04/06/2019   Neuropathy 04/06/2019   Cervical stenosis (uterine cervix) 03/31/2019   Essential hypertension 04/22/2018   Hypertriglyceridemia 04/22/2018   History of chest pain 04/22/2018   Tobacco abuse 04/22/2018    Past Surgical History:  Procedure Laterality Date   ATHERECTOMY Right    renal   CARPAL TUNNEL RELEASE Left    CARPAL TUNNEL RELEASE Right 09/27/2015   Procedure: OPEN CARPAL TUNNEL RELEASE RIGHT HAND;  Surgeon: Leanor Kail, MD;  Location: [facility];  Service: Orthopedics;  Laterality: Right;   CESAREAN SECTION     x2   COLONOSCOPY     ESOPHAGOGASTRODUODENOSCOPY     HYSTEROSCOPY WITH D & C N/A 04/18/2019   Procedure: DILATATION AND  CURETTAGE /HYSTEROSCOPY;  Surgeon: Aletha Halim, MD;  Location: [facility];  Service: Gynecology;  Laterality: N/A;   LAPAROSCOPIC SALPINGO OOPHERECTOMY Bilateral 04/18/2019   Procedure: LAPAROSCOPIC SALPINGO OOPHORECTOMY;  Surgeon: Aletha Halim, MD;  Location: [facility];  Service: Gynecology;  Laterality: Bilateral;   TUBAL LIGATION       OB History     Gravida  2   Para  2   Term      Preterm      AB      Living  2      SAB      IAB      Ectopic      Multiple      Live Births  2           Family History  Problem Relation Age of Onset   Cancer Mother    Hypertension Mother    Cancer Father    Hypertension Father    Hypertension Sister    Hypertension Brother    Diabetes Paternal Grandmother    Stroke Maternal Grandmother    Epilepsy Son     Social History   Tobacco Use   Smoking status: Every Day    Packs/day: 0.50    Years: 30.00    Pack years: 15.00    Types: Cigarettes   Smokeless tobacco: Never  Substance Use Topics   Alcohol use: No   Drug use: No    Home Medications Prior to  Admission medications   Medication Sig Start Date End Date Taking? Authorizing Provider  albuterol (VENTOLIN HFA) 108 (90 Base) MCG/ACT inhaler Inhale into the lungs. 08/16/17   [provider]  albuterol (VENTOLIN HFA) 108 (90 Base) MCG/ACT inhaler Inhale 1-2 puffs into the lungs every 6 (six) hours as needed for wheezing or shortness of breath. 05/23/20   Hazel Sams, PA-C  amoxicillin-clavulanate (AUGMENTIN) 875-125 MG tablet Take 1 tablet by mouth every 12 (twelve) hours. 05/23/20   Hazel Sams, PA-C  Cholecalciferol (VITAMIN D3) 1.25 MG (50000 UT) CAPS Take 1 capsule by mouth once a week. 08/29/19   [provider]  esomeprazole (NEXIUM) 40 MG capsule Take 40 mg by mouth daily at 12 noon.    [provider]  hyoscyamine (LEVSIN) 0.125 MG tablet Take by mouth 3 (three) times daily as needed.  05/16/19   [provider]  lidocaine (XYLOCAINE) 2 % solution Use as directed 15 mLs in the mouth or throat as needed for mouth pain. 05/23/20   Hazel Sams, PA-C  lisinopril (ZESTRIL) 30 MG tablet TAKE 1 TABLET(30 MG) BY MOUTH DAILY 12/15/19   Troy Sine, MD  nortriptyline (PAMELOR) 10 MG capsule Take 10 mg by mouth at bedtime. 1 capsule by mouth nightly for one week then increase to 2 capsules nightly    [provider]  ondansetron (ZOFRAN) 4 MG tablet Take 1 tablet (4 mg total) by mouth every 8 (eight) hours as needed for nausea or vomiting. 12/12/19   Darr, Edison Nasuti, PA-C  pregabalin (LYRICA) 50 MG capsule Take 50 mg by mouth 3 (three) times daily.    [provider]  SUMAtriptan (IMITREX) 25 MG tablet Take 25 mg by mouth every 2 (two) hours as needed for migraine. May repeat in 2 hours if headache persists or recurs.    [provider]    Allergies    Atorvastatin, Topamax [topiramate], and Vicodin [hydrocodone-acetaminophen]  Review of Systems   Review of Systems  All other systems reviewed and are negative.  Physical Exam Updated Vital Signs BP 116/71   Pulse (!) 59   Temp 98 F (36.7 C)   Resp 16   Ht 1.448 m ('4\' 9"'$ )   Wt 67.1 kg   LMP 07/30/2017   SpO2 100%   BMI 32.03 kg/m   Physical Exam Vitals and nursing note reviewed.  Constitutional:      Appearance: She is well-developed.  HENT:     Head: Normocephalic and atraumatic.     Right Ear: External ear normal.     Left Ear: External ear normal.     Nose: Nose normal.  Eyes:     Conjunctiva/sclera: Conjunctivae normal.     Pupils: Pupils are equal, round, and reactive to light.  Neck:     Thyroid: No thyromegaly.     Vascular: No JVD.     Trachea: No tracheal deviation.  Cardiovascular:     Rate and Rhythm: Normal rate and regular rhythm.     Heart sounds: Normal heart sounds.  Pulmonary:     Effort: Pulmonary effort is normal.     Breath sounds: Normal breath sounds.  No wheezing.  Abdominal:     General: Bowel sounds are normal.     Palpations: Abdomen is soft. There is no mass.     Tenderness: There is no abdominal tenderness. There is no guarding.  Musculoskeletal:        General: Normal range of motion.  Cervical back: Normal range of motion and neck supple.  Lymphadenopathy:     Cervical: No cervical adenopathy.  Skin:    General: Skin is warm and dry.  Neurological:     General: No focal deficit present.     Mental Status: She is alert and oriented to person, place, and time.     GCS: GCS eye subscore is 4. GCS verbal subscore is 5. GCS motor subscore is 6.     Cranial Nerves: No cranial nerve deficit.     Sensory: No sensory deficit.     Motor: No weakness.     Coordination: Coordination normal.     Gait: Gait normal.     Deep Tendon Reflexes: Reflexes are normal and symmetric. Reflexes normal. Babinski sign absent on the right side. Babinski sign absent on the left side.     Reflex Scores:      Bicep reflexes are 2+ on the right side and 2+ on the left side.      Patellar reflexes are 2+ on the right side and 2+ on the left side.    Comments: Strength is normal and equal throughout. Cranial nerves grossly intact. Patient fluent. No gross ataxia and patient able to ambulate without difficulty.  Psychiatric:        Mood and Affect: Mood normal.        Behavior: Behavior normal.        Thought Content: Thought content normal.        Judgment: Judgment normal.    ED Results / Procedures / Treatments   Labs (all labs ordered are listed, but only abnormal results are displayed) Labs Reviewed - No data to display  EKG None  Radiology CT Cervical Spine Wo Contrast  Result Date: 11/08/2020 CLINICAL DATA:  Radiculopathy, neck pain EXAM: CT CERVICAL SPINE WITHOUT CONTRAST TECHNIQUE: Multidetector CT imaging of the cervical spine was performed without intravenous contrast. Multiplanar CT image reconstructions were also generated.  COMPARISON:  MRI 07/09/2020 FINDINGS: Alignment: 2 mm retrolisthesis C5 upon C6 is stable, likely degenerative. Otherwise normal cervical alignment. Skull base and vertebrae: Craniocervical alignment is normal. The atlantodental interval is not widened. Are there is remote fracture of the spinous process of C7 with minimal displacement and cortication along the fracture plane. No acute fracture of the cervical spine. Soft tissues and spinal canal: Posterior disc osteophyte complex ease at C3-C6 efface the anterior canal space and abut the thecal sac with mild resultant flattening. This appears similar in appearance to prior MRI examination. No canal hematoma. The prevertebral soft tissues are not thickened. No paraspinal fluid collection. Disc levels: There is intervertebral disc space narrowing and endplate remodeling at 624THL, most severe at C5-6 in keeping with changes of mild to moderate degenerative disc disease. The prevertebral soft tissues are not thickened on sagittal reformats. Review of the axial images demonstrates multilevel uncovertebral and facet arthrosis resulting in mild bilateral neuroforaminal narrowing at C3-4, mild right and moderate left neuroforaminal narrowing at C4-5, and moderate bilateral neuroforaminal narrowing at C5-6. Upper chest: Unremarkable Other: None IMPRESSION: No acute fracture or listhesis of the cervical spine. 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 Electronically Signed   By: Fidela Salisbury MD   On: 11/08/2020 04:27    Procedures Procedures   Medications Ordered in ED Medications  ketorolac (TORADOL) 15 MG/ML injection 15 mg (15 mg Intramuscular  Given 11/08/20 [id number])    ED Course  I have reviewed the triage vital signs and the nursing notes.  Pertinent labs & imaging results  that were available during my care of the patient were reviewed by me and considered in my medical decision making (see chart for details).    MDM Rules/Calculators/A&P                           55 year old female with recent cervical injection presents today with toe numbness.  She does not have fever, increased neck pain, or any noted neurological deficits on exam.  Neck is supple, patient is afebrile and have low index of suspicion for infection.  CT was obtained of her neck prior to my evaluation and does not show any acute abnormalities although does show ongoing prior displaced nonunited, C7 spinous process fracture, and multilevel degenerative disc disease and degenerative joint disease with multilevel neuroforaminal narrowing which is stable since prior MRI. Patient advised regarding symptomatic treatment, return precautions, and need for follow-up and voiced understanding. Final Clinical Impression(s) / ED Diagnoses Final diagnoses:  Paresthesia    Rx / DC Orders ED Discharge Orders     None        Pattricia Boss, MD 11/08/20 [phone]

## 2020-11-12 ENCOUNTER — Encounter: Payer: Managed Care, Other (non HMO) | Admitting: Physical Therapy

## 2020-11-14 ENCOUNTER — Encounter: Payer: Managed Care, Other (non HMO) | Admitting: Physical Therapy

## 2021-03-05 NOTE — Progress Notes (Signed)
 " Department of Physical Therapy & Occupational Therapy  Hand Therapy Evaluation  Visit Date: 03/05/2021  Clinic Location: [place]  [facility] [address] [place] [address] Dept: [phone] Loc: [phone]  Visit Number: 1 for current Episode of Care  Robyn Anderson (Preferred name: Robyn Anderson) is a 54 y.o. year old referred to Occupational Therapy for evaluation and treatment. MRN: [id number] DOB: 10/17/1965  History  Date of Onset: 01/16/21    ICD-10-CM  1. Orthopedic aftercare  Z47.89  2. Pain of right hand  M79.641  3. Hand muscle weakness  M62.81     Diagnosis: right CTR  Date of surgery: Same as onset date   Surgery Details: Rt CTR 01/16/21  Dominant side:Left  Subjective  Mechanism of injury: symptoms of carpal tunnel for several years. She has had hx of 2 prior CTR surgeries to the right hand and 1 to the left hand   Signs of abuse:No  Barriers to Learning: none  Pain Assessment Pain Assessment %%: 0-10 Pain Score %%:   8; pain range: 8/10;  pain location: surgical scar;  pain character: sharp and stabbing, pain duration: constant and with activity   Current work status:Regular duty: Occupation: Chiropractor Hobbies: watching tv  Patient living situation: Patient lives alone  Level of function prior to onset of current symptoms:Independent  Current level of function: not reported %  Functional ADL Loss:Difficulty with job duties, difficulty with self-care activities, difficulty with household chores, difficulty opening containers, difficulty with resisted grip and pinch, difficulty with carrying task objects  clikcing the computer mouse, weightbearing on the hand,   Patient goals:Decrease pain  Be educated on a formal exercise program Increase ROM and/or muscle strength  Fall history: no Falls risk factors:  none  Other relevant personal factors and/or  co-morbidities: chronicity pain/symptoms   An expanded review of medical and therapy records was completed in addition to review of current functional level.   PMH:  Past Medical History:  Diagnosis Date   Atypical chest pain 04/25/2018   Bilateral arm pain 05/03/2019   Bilateral leg pain 03/31/2019   Burning sensation 07/03/2019   Carpal tunnel syndrome    Carpal tunnel syndrome of left wrist 09/27/2015   Cervical stenosis (uterine cervix) 03/31/2019   DDD (degenerative disc disease), lumbar 03/04/2020   Depression    Essential hypertension 04/22/2018   Formatting of this note might be different from the original. Essential hypertension  Last Assessment & Plan:  Formatting of this note might be different from the original. History of essential hypertension on lisinopril  blood pressure measured today at 104/70.   Essential tremor 10/21/2011   GERD (gastroesophageal reflux disease) 08/28/2019   Headache disorder 03/31/2019   Hip pain, right 04/04/2020   History of chest pain 04/22/2018   Formatting of this note might be different from the original. No CAD and Ca++ score 0 on CTA Feb 2020  Last Assessment & Plan:  Formatting of this note might be different from the original. History of atypical chest pain in the past with a coronary calcium score of 0.  She has had no further chest pain.   Hyperlipidemia 06/12/2020   Irritable colon 04/08/2012   Left ovarian cyst 01/02/2019   Lumbar radiculopathy 01/29/2020   Malaise and fatigue 10/21/2011   Migraine with aura and without status migrainosus, not intractable 04/06/2019   Formatting of this note might be different from the original. Dr Margaret saw her in June  2020- Dr Lane at [facility] now follows her.    Last Assessment & Plan:  Formatting of this note might be different from the original. Dr Margaret saw her in June 2020- Dr Lane at [facility] now follows her.   Migraines    Neuropathy 04/06/2019    Formatting of this note might be different from the original. LE neuropathy- followed by Dr Wilfred  Last Assessment & Plan:  Formatting of this note might be different from the original. LE neuropathy- followed by Dr Wilfred   Nondisplaced fracture of coronoid process of right ulna, initial encounter for closed fracture 02/22/2018   Obesity (BMI 30-39.9), unspecified 05/04/2014   Poor intravenous access    Post-menopausal bleeding 01/02/2019   Prehypertension 05/04/2014   Right carpal tunnel syndrome 08/22/2015   Right wrist pain 08/12/2015   Status post carpal tunnel release 10/15/2015   Tobacco abuse 02/15/2017   Formatting of this note might be different from the original. Tobacco abuse  Last Assessment & Plan:  Formatting of this note might be different from the original. Ongoing tobacco abuse of 1/2 pack/day recalcitrant to respect modification.   Total body pain, unspecified 07/03/2019   Ulnar neuropathy at elbow    Ulnar neuropathy of right upper extremity 08/22/2015   Uterine fibroid 01/02/2019   Vitamin B12 deficiency anemia 04/08/2012   Vitamin D deficiency 10/27/2011          OSPRO-YF:     03/05/2021    1:18 PM  OSPRO Yellow Flag Screening  Negative Mood - Depression  Yes  Negative Mood -  Anxiety Yes  Negative Mood -  Anger Yes  Fear Avoidance - Fear Avoidance Beliefs - Physical Activity Yes  Fear Avoidance - Fear Avoidance Beliefs - Work Yes  Fear Avoidance - Pain Catastrophizing Yes  Fear Avoidance - Kinesiophobia Yes  Fear Avoidance - Pain Anxiety Yes  Positive Affect/Coping - Pain Self-Efficacy Questionnaire Yes  Positive Affect/Coping - Self-Efficacy for Rehabilitation Yes  Positive Affect/Coping - Chronic Pain Acceptance Yes  OSPRO Yellow Flag Screening - Total Score 38  Poor appetite or overeating? Nearly every day  Some unimportant thoughts run through my mind and bother me. Often  I am a hotheaded person. Almost never  I  wouldn't have as much pain as I do if there weren't something potentially dangerous going on in my body. Strongly agree  I can't seem to keep the pain out of my mind. All the time  I cannot do physical activities that (might) make my pain worse.[Completely Disagree(0)-Completely Agree(6)]  6 -  Completely Agree  My work is too heavy for me. [Completely Disagree(0) - Completely Agree(6)] 5  It's OK to experience pain. [Never True(0) - Always True (6)] 0 - Never true  I lead a full life even though I have chronic pain. [Never True(0) - Always True(6)] 0 - Never true  I can perform my therapy no matter how I feel emotionally. [Cannot do it(0) - Can do it(10)] 10 - Certain I can do it  Total OSPRO Yellow Flags (Out of 11) 11     PROMIS CAT - ADULT      12/02/2020 01/24/2021 02/21/2021 03/05/2021  ADULT PROMIS CAT  PROMIS Physical Function T-Score 39 34 43   PROMIS Upper Extremity Function T-Score 32 25 29 29   PROMIS Pain Interference T-Score (range: 10 - 90) 64 74 67 74  PROMIS Depression T-Score 60 68 71      For PROMIS measures, higher scores equals  more of the concept being measured.  PROMIS scores have a mean of 50 and standard deviation (SD) of 10. Scores 0.5 - 1.0 SD worse than the mean = mild symptoms/impairment Scores 1.0 - 2.0 SD worse than the mean = moderate symptoms/impairment Scores 2.0 SD or more worse than the mean = severe symptoms/impairment  Interpretation of PROMIS Scores WNL Mild Symptoms/Impairment Moderate Symptoms/Impairment Severe Symptoms/Impairment  Physical Function (Adult and Pediatric) 45 or greater 40-44 30-39 29 or less  Adult Pain Interference 54 or less 55-59 60-69 70 or greater  Depression 54 or less 55-59 60-69 70 or greater  Sleep Disturbance 54 or less 55-59 60-69 70 or greater  Pediatric or Parent Proxy Pain Interference 50 or less 50-54 55-64 65 or greater  Cognitive Function  45 or greater 40-44  30-39 29 or less    Objective  Mental functions  that reside within the patient that influence the patient's performance in occupations: none noted  Scar Pliability:healed surgical scar with moderate underlying scar tissue, slightly raised over wrist crease  Sensation: grossly intact              Grip Strength 1 Trial Grip Strength 1 Trial: Level 2           R Level 2 (lb): 24 lb           L Level 2 (lb): 35 lb          Robyn Anderson's right UE gross motor coordination is intact.  Robyn Anderson's right hand fine motor coordination is impaired; ineffective grip, pinch, manipulation, and/or stabilization on task objects.   Treatment Today  Modalities Not performed this session.  Orthotic Fit and Function No orthotic needs this visit  Therapeutic Procedures  Range of motion: Wrist / Forearm:  Passive:  wrist flexion and extension Thumb:      AROM: opposition to all fingers Finger(s):      AROM: composite finger flexion composite finger extension   Strengthening: Putty exercises: light/medium resistive putty gross finger flexion, putty rolling for finger extension , 3 point pinching, opposition of thumb to individual fingers with putty and Putty vended to pt. and instructed in home use of putty  Therapeutic Activities: Scar management:      Pt. instructed in proper use of gel digit sleeve and vended for home use.       Pt. instructed in proper technique of scar massage.       instructed in use of mini-massager and vended for home use. Provided tubigrip sleeve added patient comfort and compression over silicone gel sheet.       Home Exercise Program (Therapeutic Procedures)      Pt. instructed with verbal and/or written and supervision performance of the following exercises:      exercises listed above      The patient demonstrates the home program accurately  Patient Education The patient received education regarding Home Exercises by verbal communication and demonstration, appeared to show willingness to learn and verbalized  understanding and able to perform exercises as directed  Assessment  Rehabilitation Potential:Good  Client factors and skill area loss that influence their performance in occupations: Edema, Decreased strength, Pain, Limitations of activities of daily living and Fine motor coordination limitations    Performance deficits include activity limitations and/or participation restrictions in the followings occupations: bathing/showering, dressing, health management and maintenance/recreation/gym activities  , home establishment and management, job performance, meal preparation and cleanup, personal hygiene and grooming, shopping, social participation with community/family/friend and play/leisure  participation  In conclusion, a detailed assessment was completed, including consideration of several treatment options, and the assessment required minimal to moderate modification of tasks or assistance to complete.    Robyn Anderson is a 55 year old, left hand dominant female referred to OT following right CTR surgery. Patient presents with persistent pillar pain around surgical scar and subsequent weakness of the left hand. Patient has difficulty using a computer mouse at work and gripping items around the house to complete chores. The patient will benefit from skilled OT intervention to address the above stated impairments and assist the patient in maximizing their functional level.  Goals   Short Term Goals: -  Patient will demonstrate independence in formal home program to facilitate progress toward long-term goals.  Status: Met for initial HEP issue for this stage of recovery, ongoing HEP modifications likely to occur at each subsequent hand therapy visit per patient's symptoms and progression in OT program. -  Patient will report a decrease in pain (0-10 scale) to no greater than 2/10 to increase ROM, improve activity tolerance, and facilitate progress toward long-term goals.  Status: New and progressing -   Patient will be independent in scar management techniques to progress toward long-term goals.  Status: New and progressing Time frame for goals:                 by 6 weeks  Long Term Goals:  The patient will demonstrate improved function as measured by an increase in PROMIS-CAT Physical Function score by > or = to 5 points and engagement in activity as measured by a decrease in PROMIS-CAT Pain Interference score by > or = to 5 points. -  Patient will demonstrate improved grip strength to 75 % of contralateral side to improve ability to complete the following activities lifting household items to complete chores.  Status: New  Time frame for goals:                 by 12 weeks    Treatment Plan  Patient will be treated 1 time(s) every 4 weeks over next 12 weeks (as scheduling permits).  Treatment will consist of the following: AROM/AAROM/PROM , Orthotic fabrication and modification (as appropriate), Therapeutic activities , Strengthening exercises , Home exercise instruction/review/revision, Wound care/ Scar management / Edema control techniques , Desensitization , Modalities of choice  and Instruction in joint protection/energy conservation/adaptive equipment   OT Billing Documentation OT Evaluation or Progress Note Completed Today: Yes Date of Onset: 01/16/21 Visit Number: 1 Session Start:: 1400 Session Stop:: 1440 Total Time: 40 minutes OT Eval MODERATE Complexity CPT 97166: 1 Therapeutic Procedure CPT 97110 : 15 minutes Therapeutic Activity CPT 97530: 15 minutes    The complexity of care was determined based on the occupational profile, standardized tests and/or clinical reasoning and judgment.  The plan of care was developed in collaboration with the patient/family to determine the appropriate level of intervention to meet the above goals.  If patient returns to clinic with variance in plan of care, then it may be attributable to one or more of the following factors: preferred  clinician availability, appointment time request availability, therapy pool appointment availability, major holiday with clinic closure, caregiver availability, patient transportation, conflicting medical appointment, inclement weather, patient illness, and/or scheduling error.  Note: If patient does not return for follow up visit(s) related to this episode of care, this note will serve as their discharge note from occupational therapy. ___________________________________________________  Clinical Notes on My Chart: Progress notes documented by your healthcare team will now be available on the MyChart portal.  We believe that patients should be a part of the healthcare team.  We encourage you to review notes after visits and in preparation for upcoming appointments.  This provides the opportunity to review recommendations as well as to prepare questions for your healthcare team to address during your next visit.  If you identify discrepancies in the documentation or have specific questions related to the notes, please bring them to your next scheduled visit to discuss with your Physical Therapist (PT) or Occupational Therapist (OT.)  With increased transparency, our hope is that we create more trust, better communication, more shared decision-making, and increased satisfaction. Please be aware that these notes will not be discussed over the phone or through My Chart messages.  They will be discussed only at your next office visit with your provider.  "

## 2021-05-14 ENCOUNTER — Other Ambulatory Visit: Payer: Self-pay | Admitting: Urology

## 2021-05-14 DIAGNOSIS — R310 Gross hematuria: Secondary | ICD-10-CM

## 2021-05-28 ENCOUNTER — Ambulatory Visit: Admission: RE | Admit: 2021-05-28 | Payer: Managed Care, Other (non HMO) | Source: Ambulatory Visit

## 2021-05-29 ENCOUNTER — Ambulatory Visit: Admission: RE | Admit: 2021-05-29 | Payer: Managed Care, Other (non HMO) | Source: Ambulatory Visit

## 2021-06-10 ENCOUNTER — Ambulatory Visit
Admission: RE | Admit: 2021-06-10 | Discharge: 2021-06-10 | Disposition: A | Discharge status: Home / Self Care | Payer: Managed Care, Other (non HMO) | Source: Ambulatory Visit | Attending: Urology | Admitting: Urology

## 2021-06-10 DIAGNOSIS — R310 Gross hematuria: Secondary | ICD-10-CM | POA: Diagnosis not present

## 2021-06-10 LAB — POCT I-STAT CREATININE: Creatinine, Ser: 1 mg/dL (ref 0.44–1.00)

## 2021-06-10 MED ORDER — IOHEXOL 300 MG/ML  SOLN
100.0000 mL | Freq: Once | INTRAMUSCULAR | Status: AC | PRN
  Administered 2021-06-10: 100 mL via INTRAVENOUS

## 2021-06-25 ENCOUNTER — Emergency Department
Admission: EM | Admit: 2021-06-25 | Discharge: 2021-06-25 | Disposition: A | Discharge status: Home / Self Care | Payer: Managed Care, Other (non HMO) | Attending: Emergency Medicine | Admitting: Emergency Medicine

## 2021-06-25 ENCOUNTER — Encounter: Payer: Self-pay | Admitting: Emergency Medicine

## 2021-06-25 ENCOUNTER — Emergency Department: Payer: Managed Care, Other (non HMO)

## 2021-06-25 ENCOUNTER — Other Ambulatory Visit: Payer: Self-pay

## 2021-06-25 DIAGNOSIS — I10 Essential (primary) hypertension: Secondary | ICD-10-CM | POA: Insufficient documentation

## 2021-06-25 DIAGNOSIS — B349 Viral infection, unspecified: Secondary | ICD-10-CM | POA: Insufficient documentation

## 2021-06-25 DIAGNOSIS — I2510 Atherosclerotic heart disease of native coronary artery without angina pectoris: Secondary | ICD-10-CM | POA: Insufficient documentation

## 2021-06-25 DIAGNOSIS — J029 Acute pharyngitis, unspecified: Secondary | ICD-10-CM | POA: Diagnosis present

## 2021-06-25 LAB — BASIC METABOLIC PANEL
Anion gap: 5 (ref 5–15)
BUN: 13 mg/dL (ref 6–20)
CO2: 23 mmol/L (ref 22–32)
Calcium: 9.2 mg/dL (ref 8.9–10.3)
Chloride: 110 mmol/L (ref 98–111)
Creatinine, Ser: 0.89 mg/dL (ref 0.44–1.00)
GFR, Estimated: >60 mL/min (ref >60)
Glucose, Bld: 102 mg/dL — ABNORMAL HIGH (ref 70–99)
Potassium: 3.8 mmol/L (ref 3.5–5.1)
Sodium: 138 mmol/L (ref 135–145)

## 2021-06-25 LAB — CBC
HCT: 45.1 % (ref 36.0–46.0)
Hemoglobin: 14.6 g/dL (ref 12.0–15.0)
MCH: 30.5 pg (ref 26.0–34.0)
MCHC: 32.4 g/dL (ref 30.0–36.0)
MCV: 94.2 fL (ref 80.0–100.0)
Platelets: 268 10*3/uL (ref 150–400)
RBC: 4.79 MIL/uL (ref 3.87–5.11)
RDW: 13.4 % (ref 11.5–15.5)
WBC: 9 10*3/uL (ref 4.0–10.5)
nRBC: 0 % (ref 0.0–0.2)

## 2021-06-25 LAB — TROPONIN I (HIGH SENSITIVITY): Troponin I (High Sensitivity): <2 ng/L (ref <18)

## 2021-06-25 MED ORDER — DEXAMETHASONE 10 MG/ML FOR PEDIATRIC ORAL USE
10.0000 mg | Freq: Once | INTRAMUSCULAR | Status: AC
  Administered 2021-06-25: 10 mg via ORAL
  Filled 2021-06-25: qty 1

## 2021-06-25 MED ORDER — IBUPROFEN 400 MG PO TABS
400.0000 mg | ORAL_TABLET | Freq: Once | ORAL | Status: AC
  Administered 2021-06-25: 400 mg via ORAL
  Filled 2021-06-25: qty 1

## 2021-06-25 NOTE — ED Notes (Signed)
Pt verbalized understanding of discharge.

## 2021-06-25 NOTE — Discharge Instructions (Addendum)
Your chest x-ray did not show pneumonia and your blood work was all reassuring.  You are given a dose of steroids which should help with your sore throat.  You can continue to take Motrin and Tylenol. ?

## 2021-06-25 NOTE — ED Notes (Signed)
Pt arrived via POV with reports of sore throat that started on Friday and shortness of breath that started tonight. Pt able to speak in complete sentences without running out of breath. Pt states she was seen at urgent care on Saturday and dx with UTI and is currently on antibiotics. Pt states she was swabbed for covid, flu, rsv and strep and all were NEGATIVE. ? ?Pt c/o discomfort in chest at this time ?

## 2021-06-25 NOTE — ED Notes (Addendum)
Pt reports sore throat and sob.  Pt also has chest discomfort.  Sx for 1 week.  No cough.  No n/v/  no diaphoresis.  Pt was seen at the urgent care and dx with uti.    Pt alert   ?

## 2021-06-25 NOTE — ED Provider Notes (Signed)
? ?[facility] ?Provider Note ? ? ? Event Date/Time  ? First MD Initiated Contact with Patient 06/25/21 [phone]   ?  (approximate) ? ? ?History  ? ?Sore Throat and Shortness of Breath ? ? ?HPI ? ?Robyn Anderson is a 56 y.o. female with past medical history of coronary artery disease, GERD, migraine headaches who presents with sore throat.  Symptoms of been going on for the past 3 days.  She went to urgent care on Friday and was told that it was allergies.  She had COVID and influenza RSV test and strep test that were negative.  She was started on Augmentin for UTI she says.  She has been taking ibuprofen and Flonase for her symptoms.  Her sore throat has continued.  Still tolerating p.o. without voice change.  Denies fevers chills.  Does have congestion with postnasal drip.  Has mild cough that is nonproductive.  Tonight at work she felt short of breath which is why she returns to the ED.  Denies chest pain.  No nausea vomiting.  Her stepdaughter has similar symptoms. ? ?  ? ?Past Medical History:  ?Diagnosis Date  ? Barrett esophagus   ? Coronary artery disease   ? GERD (gastroesophageal reflux disease)   ? Headache   ? migraines 1-2/mo  ? Hypertension   ? Obesity (BMI 30-39.9)   ? Postmenopausal bleeding 01/20/2019  ? ? ?Patient Active Problem List  ? Diagnosis Date Noted  ? Migraine with aura and without status migrainosus, not intractable 04/06/2019  ? Neuropathy 04/06/2019  ? Cervical stenosis (uterine cervix) 03/31/2019  ? Essential hypertension 04/22/2018  ? Hypertriglyceridemia 04/22/2018  ? History of chest pain 04/22/2018  ? Tobacco abuse 04/22/2018  ? ? ? ?Physical Exam  ?Triage Vital Signs: ?ED Triage Vitals  ?Enc Vitals Group  ?   BP 06/25/21 0209 (!) 113/53  ?   Pulse Rate 06/25/21 0209 71  ?   Resp 06/25/21 0209 16  ?   Temp 06/25/21 0209 (!) 97.5 ?F (36.4 ?C)  ?   Temp Source 06/25/21 0209 Oral  ?   SpO2 06/25/21 0209 100 %  ?   Weight 06/25/21 0200 142 lb (64.4 kg)  ?   Height  06/25/21 0200 '4\' 9"'$  (1.448 m)  ?   Head Circumference --   ?   Peak Flow --   ?   Pain Score 06/25/21 0200 7  ?   Pain Loc --   ?   Pain Edu? --   ?   Excl. in [place]? --   ? ? ?Most recent vital signs: ?Vitals:  ? 06/25/21 0209 06/25/21 0300  ?BP: (!) 113/53 (!) 122/51  ?Pulse: 71 (!) 57  ?Resp: 16 18  ?Temp: (!) 97.5 ?F (36.4 ?C)   ?SpO2: 100% 100%  ? ? ? ?General: Awake, no distress.  ?CV:  Good peripheral perfusion. No edema ?Resp:  Normal effort. Lungs are clear, no wheezing ?Abd:  No distention.  ?Neuro:             Awake, Alert, Oriented x 3  ?Other:  Tonsils are mildly erythematous, no exudate uvula is midline no trismus ? ? ?ED Results / Procedures / Treatments  ?Labs ?(all labs ordered are listed, but only abnormal results are displayed) ?Labs Reviewed  ?BASIC METABOLIC PANEL - Abnormal; Notable for the following components:  ?    Result Value  ? Glucose, Bld 102 (*)   ? All other components within normal  limits  ?CBC  ?TROPONIN I (HIGH SENSITIVITY)  ? ? ? ?EKG ? ?EKG interpretation performed by myself: NSR, nml axis, nml intervals, no acute ischemic changes ? ? ? ?RADIOLOGY ?I reviewed the CXR which does not show any acute cardiopulmonary process; agree with radiology report  ? ? ? ?PROCEDURES: ? ?Critical Care performed: No ? ?.1-3 Lead EKG Interpretation ?Performed by: Rada Hay, MD ?Authorized by: Rada Hay, MD  ? ?  Interpretation: normal   ?  ECG rate assessment: normal   ?  Rhythm: sinus rhythm   ?  Ectopy: none   ?  Conduction: normal   ? ?The patient is on the cardiac monitor to evaluate for evidence of arrhythmia and/or significant heart rate changes. ? ? ?MEDICATIONS ORDERED IN ED: ?Medications  ?dexamethasone (DECADRON) 10 MG/ML injection for Pediatric ORAL use 10 mg (10 mg Oral Given 06/25/21 0252)  ?ibuprofen (ADVIL) tablet 400 mg (400 mg Oral Given 06/25/21 0252)  ? ? ? ?IMPRESSION / MDM / ASSESSMENT AND PLAN / ED COURSE  ?I reviewed the triage vital signs and the nursing notes. ?              ?               ? ?Differential diagnosis includes, but is not limited to, viral illness, pharyngitis, pneumonia, bronchitis, less likely pulmonary embolism, ACS ? ?This patient is a 56 year old female presents with sore throat times several days and shortness of breath tonight.  Seen in urgent care several days ago negative strep test flu influenza and RSV.  Prescribed Augmentin for UTI currently despite having no urinary symptoms.  Sore throat has been ongoing but she is still tolerating p.o. without fever no voice change.  On exam she has some erythema of her tonsils but no exudate uvula is midline no trismus I am not suggestive of PTA.  She developed shortness of breath started at work.  She is satting 100% on room air not tachycardic.  Lungs are clear she is not in any respiratory distress.  Chest x-ray obtained to rule out pneumonia which is negative.  EKG also negative.  CBC BMP and troponin all within normal limits.  I suspect she has a viral illness specially given her child has similar symptoms.  Given ongoing sore throat not relieved by ibuprofen she was given a dose of dexamethasone in the ED.  She is appropriate for discharge. ? ? ?Patient CBC BMP and troponin are all reassuring.  Troponin is negative no leukocytosis or electrolyte abnormality.  Again suspect viral illness.  Patient provided with note for work. ?  ? ? ?FINAL CLINICAL IMPRESSION(S) / ED DIAGNOSES  ? ?Final diagnoses:  ?Pharyngitis, unspecified etiology  ?Viral illness  ? ? ? ?Rx / DC Orders  ? ?ED Discharge Orders   ? ? None  ? ?  ? ? ? ?Note:  This document was prepared using Dragon voice recognition software and may include unintentional dictation errors. ?  ?Rada Hay, MD ?06/25/21 0327 ? ?

## 2021-08-06 ENCOUNTER — Other Ambulatory Visit: Payer: Self-pay

## 2021-08-06 ENCOUNTER — Emergency Department (HOSPITAL_COMMUNITY)
Admission: EM | Admit: 2021-08-06 | Discharge: 2021-08-06 | Disposition: A | Discharge status: Home / Self Care | Payer: Managed Care, Other (non HMO) | Attending: Emergency Medicine | Admitting: Emergency Medicine

## 2021-08-06 ENCOUNTER — Emergency Department (HOSPITAL_COMMUNITY): Payer: Managed Care, Other (non HMO)

## 2021-08-06 ENCOUNTER — Encounter (HOSPITAL_COMMUNITY): Payer: Self-pay

## 2021-08-06 DIAGNOSIS — R0602 Shortness of breath: Secondary | ICD-10-CM | POA: Diagnosis present

## 2021-08-06 DIAGNOSIS — T43295A Adverse effect of other antidepressants, initial encounter: Secondary | ICD-10-CM | POA: Insufficient documentation

## 2021-08-06 DIAGNOSIS — T887XXA Unspecified adverse effect of drug or medicament, initial encounter: Secondary | ICD-10-CM | POA: Insufficient documentation

## 2021-08-06 MED ORDER — ALBUTEROL SULFATE HFA 108 (90 BASE) MCG/ACT IN AERS
2.0000 | INHALATION_SPRAY | RESPIRATORY_TRACT | Status: DC | PRN
  Administered 2021-08-06: 2 via RESPIRATORY_TRACT
  Filled 2021-08-06: qty 6.7

## 2021-08-06 NOTE — ED Provider Notes (Signed)
?[place] ?Provider Note ? ? ?CSN: [id number] ?Arrival date & time: 08/06/21  1440 ? ?  ? ?History ? ?Chief Complaint  ?Patient presents with  ? Shortness of Breath  ? ? ?Robyn Anderson is a 56 y.o. female. ? ?Patient here with concern for medication side effect.  Feeling anxiousness, shortness of breath after taking her first dose of the venlafaxine.  States that she is continuing to feel anxious after taking it last night.  Denies any chest pain, ongoing shortness of breath.  She has some chronic leg issues and chronic headaches that is why she takes this medicine.  Patient denies any abdominal pain.  She did have some nausea and vomiting that has now resolved as well. ? ? ? ?  ? ?Home Medications ?Prior to Admission medications   ?Medication Sig Start Date End Date Taking? Authorizing Provider  ?albuterol (VENTOLIN HFA) 108 (90 Base) MCG/ACT inhaler Inhale into the lungs. 08/16/17   [provider]  ?albuterol (VENTOLIN HFA) 108 (90 Base) MCG/ACT inhaler Inhale 1-2 puffs into the lungs every 6 (six) hours as needed for wheezing or shortness of breath. 05/23/20   Hazel Sams, PA-C  ?amoxicillin-clavulanate (AUGMENTIN) 875-125 MG tablet Take 1 tablet by mouth every 12 (twelve) hours. 05/23/20   Hazel Sams, PA-C  ?Cholecalciferol (VITAMIN D3) 1.25 MG (50000 UT) CAPS Take 1 capsule by mouth once a week. 08/29/19   [provider]  ?esomeprazole (NEXIUM) 40 MG capsule Take 40 mg by mouth daily at 12 noon.    [provider]  ?hyoscyamine (LEVSIN) 0.125 MG tablet Take by mouth 3 (three) times daily as needed. 05/16/19   [provider]  ?lidocaine (XYLOCAINE) 2 % solution Use as directed 15 mLs in the mouth or throat as needed for mouth pain. 05/23/20   Hazel Sams, PA-C  ?lisinopril (ZESTRIL) 30 MG tablet TAKE 1 TABLET(30 MG) BY MOUTH DAILY 12/15/19   Troy Sine, MD  ?nortriptyline (PAMELOR) 10 MG capsule Take 10 mg by mouth at  bedtime. 1 capsule by mouth nightly for one week then increase to 2 capsules nightly    [provider]  ?ondansetron (ZOFRAN) 4 MG tablet Take 1 tablet (4 mg total) by mouth every 8 (eight) hours as needed for nausea or vomiting. 12/12/19   Darr, Edison Nasuti, PA-C  ?pregabalin (LYRICA) 50 MG capsule Take 50 mg by mouth 3 (three) times daily.    [provider]  ?SUMAtriptan (IMITREX) 25 MG tablet Take 25 mg by mouth every 2 (two) hours as needed for migraine. May repeat in 2 hours if headache persists or recurs.    [provider]  ?   ? ?Allergies    ?Topiramate, Atorvastatin, Diclofenac, Hydrocodone-acetaminophen, and Vicodin [hydrocodone-acetaminophen]   ? ?Review of Systems   ?Review of Systems ? ?Physical Exam ?Updated Vital Signs ?BP (!) 169/90 (BP Location: Right Arm)   Pulse 73   Temp 98.7 ?F (37.1 ?C) (Oral)   Resp 18   Ht '4\' 9"'$  (1.448 m)   Wt 61.2 kg   LMP 07/30/2017   SpO2 97%   BMI 29.21 kg/m?  ?Physical Exam ?Vitals and nursing note reviewed.  ?Constitutional:   ?   General: She is not in acute distress. ?   Appearance: She is well-developed. She is not ill-appearing.  ?HENT:  ?   Head: Normocephalic and atraumatic.  ?   Mouth/Throat:  ?   Mouth: Mucous membranes are moist.  ?  Eyes:  ?   Conjunctiva/sclera: Conjunctivae normal.  ?   Pupils: Pupils are equal, round, and reactive to light.  ?Cardiovascular:  ?   Rate and Rhythm: Normal rate and regular rhythm.  ?   Pulses: Normal pulses.  ?   Heart sounds: Normal heart sounds. No murmur heard. ?Pulmonary:  ?   Effort: Pulmonary effort is normal. No respiratory distress.  ?   Breath sounds: Normal breath sounds. No decreased breath sounds, wheezing or rhonchi.  ?Abdominal:  ?   Palpations: Abdomen is soft.  ?   Tenderness: There is no abdominal tenderness.  ?Musculoskeletal:     ?   General: No swelling.  ?   Cervical back: Normal range of motion and neck supple.  ?Skin: ?   General: Skin is warm and dry.  ?   Capillary Refill:  Capillary refill takes less than 2 seconds.  ?Neurological:  ?   General: No focal deficit present.  ?   Mental Status: She is alert and oriented to person, place, and time.  ?   Cranial Nerves: No cranial nerve deficit.  ?   Motor: No weakness.  ?Psychiatric:     ?   Mood and Affect: Mood is anxious.  ? ? ?ED Results / Procedures / Treatments   ?Labs ?(all labs ordered are listed, but only abnormal results are displayed) ?Labs Reviewed - No data to display ? ?EKG ?EKG Interpretation ? ?Date/Time:  Wednesday August 06 2021 15:03:37 EDT ?Ventricular Rate:  74 ?PR Interval:  144 ?QRS Duration: 72 ?QT Interval:  388 ?QTC Calculation: 430 ?R Axis:   -4 ?Text Interpretation: Normal sinus rhythm Normal ECG When compared with ECG of 25-Jun-2021 02:14, PREVIOUS ECG IS PRESENT Confirmed by Robyn Anderson [phone]) on 08/06/2021 5:11:28 PM ? ?Radiology ?DG Chest 2 View ? ?Result Date: 08/06/2021 ?CLINICAL DATA:  Shortness of breath EXAM: CHEST - 2 VIEW COMPARISON:  Radiograph 06/25/2021 FINDINGS: The heart size and mediastinal contours are within normal limits.No focal airspace disease. No pleural effusion or pneumothorax.No acute osseous abnormality. Thoracic spondylosis. IMPRESSION: No evidence of acute cardiopulmonary disease. Electronically Signed   By: Maurine Simmering M.D.   On: 08/06/2021 15:56   ? ?Procedures ?Procedures  ? ? ?Medications Ordered in ED ?Medications - No data to display ? ?ED Course/ Medical Decision Making/ A&P ?  ?                        ?Medical Decision Making ?Amount and/or Complexity of Data Reviewed ?Radiology: ordered. ? ? ?Robyn Anderson is here with medication side effect, anxiety.  Unremarkable vitals.  No fever.  EKG per my review and interpretation shows sinus rhythm.  No ischemic changes.  Chest x-ray has been done in triage and per my review and interpretation there is no evidence of pneumonia.  Overall she appears well.  She is anxious and feels like after she took this dose of ven,afaxine last  night she has not felt generally well.  She denies any active chest pain, shortness of breath, abdominal pain neurologically she is intact.  Overall she appears well.  She does take benzos for anxiety as needed however she drove here.  She has the medication at home and recommend that maybe she consider taking that dose if she is still having some symptoms later today.  I have no concern for ACS or PE or other acute process at this time.  Suspect that visit today is medication related  side effect or anxiety.  Vital signs are unremarkable.  Images and EKG unremarkable.  Recommend follow-up with her neurologist and discharged in ED in good condition.  No concern anaphylaxis.  No rash, no wheezing, no swelling of the mouth or lips. ? ?This chart was dictated using voice recognition software.  Despite best efforts to proofread,  errors can occur which can change the documentation meaning.  ? ? ? ? ? ? ? ?Final Clinical Impression(s) / ED Diagnoses ?Final diagnoses:  ?Medication side effect  ? ? ?Rx / DC Orders ?ED Discharge Orders   ? ? None  ? ?  ? ? ?  ?Robyn Sites, DO ?08/06/21 1729 ? ?

## 2021-08-06 NOTE — ED Triage Notes (Signed)
Pt arrived POV from home c/o SHOB, headache and leg pain after starting a new medication yesterday. Pt was started on Venlafaxine and took one dose last night when she started feeling weird. Pt called her dr this morning who stated to quit taking the medicine but to come here.  ?

## 2021-10-03 ENCOUNTER — Emergency Department: Payer: Managed Care, Other (non HMO)

## 2021-10-03 ENCOUNTER — Other Ambulatory Visit: Payer: Self-pay

## 2021-10-03 ENCOUNTER — Emergency Department
Admission: EM | Admit: 2021-10-03 | Discharge: 2021-10-03 | Disposition: A | Discharge status: Home / Self Care | Payer: Managed Care, Other (non HMO) | Attending: Emergency Medicine | Admitting: Emergency Medicine

## 2021-10-03 ENCOUNTER — Encounter: Payer: Self-pay | Admitting: Radiology

## 2021-10-03 DIAGNOSIS — R197 Diarrhea, unspecified: Secondary | ICD-10-CM | POA: Insufficient documentation

## 2021-10-03 DIAGNOSIS — I2510 Atherosclerotic heart disease of native coronary artery without angina pectoris: Secondary | ICD-10-CM | POA: Insufficient documentation

## 2021-10-03 DIAGNOSIS — I10 Essential (primary) hypertension: Secondary | ICD-10-CM | POA: Insufficient documentation

## 2021-10-03 DIAGNOSIS — R1031 Right lower quadrant pain: Secondary | ICD-10-CM | POA: Insufficient documentation

## 2021-10-03 DIAGNOSIS — R110 Nausea: Secondary | ICD-10-CM | POA: Insufficient documentation

## 2021-10-03 DIAGNOSIS — R1011 Right upper quadrant pain: Secondary | ICD-10-CM | POA: Insufficient documentation

## 2021-10-03 DIAGNOSIS — R109 Unspecified abdominal pain: Secondary | ICD-10-CM | POA: Diagnosis present

## 2021-10-03 LAB — URINALYSIS, ROUTINE W REFLEX MICROSCOPIC
Bacteria, UA: NONE SEEN
Bilirubin Urine: NEGATIVE
Glucose, UA: NEGATIVE mg/dL
Hgb urine dipstick: NEGATIVE
Ketones, ur: NEGATIVE mg/dL
Nitrite: NEGATIVE
Protein, ur: NEGATIVE mg/dL
Specific Gravity, Urine: 1.021 (ref 1.005–1.030)
pH: 5 (ref 5.0–8.0)

## 2021-10-03 LAB — COMPREHENSIVE METABOLIC PANEL
ALT: 24 U/L (ref 0–44)
AST: 25 U/L (ref 15–41)
Albumin: 4.3 g/dL (ref 3.5–5.0)
Alkaline Phosphatase: 105 U/L (ref 38–126)
Anion gap: 8 (ref 5–15)
BUN: 14 mg/dL (ref 6–20)
CO2: 26 mmol/L (ref 22–32)
Calcium: 9.6 mg/dL (ref 8.9–10.3)
Chloride: 107 mmol/L (ref 98–111)
Creatinine, Ser: 0.91 mg/dL (ref 0.44–1.00)
GFR, Estimated: >60 mL/min (ref >60)
Glucose, Bld: 85 mg/dL (ref 70–99)
Potassium: 3.7 mmol/L (ref 3.5–5.1)
Sodium: 141 mmol/L (ref 135–145)
Total Bilirubin: 0.7 mg/dL (ref 0.3–1.2)
Total Protein: 7.4 g/dL (ref 6.5–8.1)

## 2021-10-03 LAB — CBC
HCT: 47.5 % — ABNORMAL HIGH (ref 36.0–46.0)
Hemoglobin: 15.7 g/dL — ABNORMAL HIGH (ref 12.0–15.0)
MCH: 30.8 pg (ref 26.0–34.0)
MCHC: 33.1 g/dL (ref 30.0–36.0)
MCV: 93.3 fL (ref 80.0–100.0)
Platelets: 290 10*3/uL (ref 150–400)
RBC: 5.09 MIL/uL (ref 3.87–5.11)
RDW: 12.6 % (ref 11.5–15.5)
WBC: 8.6 10*3/uL (ref 4.0–10.5)
nRBC: 0 % (ref 0.0–0.2)

## 2021-10-03 LAB — LIPASE, BLOOD: Lipase: 35 U/L (ref 11–51)

## 2021-10-03 MED ORDER — IOHEXOL 300 MG/ML  SOLN
100.0000 mL | Freq: Once | INTRAMUSCULAR | Status: AC | PRN
  Administered 2021-10-03: 100 mL via INTRAVENOUS

## 2021-10-03 MED ORDER — SODIUM CHLORIDE 0.9 % IV BOLUS
1000.0000 mL | Freq: Once | INTRAVENOUS | Status: AC
  Administered 2021-10-03: 1000 mL via INTRAVENOUS

## 2021-10-03 MED ORDER — ONDANSETRON HCL 4 MG/2ML IJ SOLN
4.0000 mg | Freq: Once | INTRAMUSCULAR | Status: AC
  Administered 2021-10-03: 4 mg via INTRAVENOUS
  Filled 2021-10-03: qty 2

## 2021-10-03 MED ORDER — HYOSCYAMINE SULFATE SL 0.125 MG SL SUBL: 0.1250 mg | SUBLINGUAL_TABLET | Freq: Four times a day (QID) | SUBLINGUAL | 0 refills | Status: AC | PRN

## 2021-10-03 MED ORDER — FENTANYL CITRATE PF 50 MCG/ML IJ SOSY
50.0000 ug | PREFILLED_SYRINGE | Freq: Once | INTRAMUSCULAR | Status: AC
  Administered 2021-10-03: 50 ug via INTRAVENOUS
  Filled 2021-10-03: qty 1

## 2021-10-03 NOTE — Discharge Instructions (Signed)
Your ultrasound and CT of the abdomen and pelvis were both negative for concerning findings.  Please take medication as prescribed which should help with the nausea and pain.  Follow-up with your gastroenterologist next week if not improving.  Return to the emergency department for symptoms that change or worsen or for new concerns if you are unable to schedule an appointment.

## 2021-10-03 NOTE — ED Triage Notes (Signed)
Pt here with right side abd pain that started 2-3 weeks ago and has gotten worse over time. Pt states she is nausea and not able to keep anything down. Pt states she also has IBS so she has some periods of diarrhea. Pt states pain does not radiate.

## 2021-10-03 NOTE — ED Provider Notes (Signed)
[facility] Provider Note    Event Date/Time   First MD Initiated Contact with Patient 10/03/21 1111     (approximate)   History   Abdominal Pain   HPI  Robyn Anderson is a 56 y.o. female presenting to the emergency department for treatment and evaluation of right side abdominal pain with associated nausea.  She reports history of IBS and has intermittent diarrhea but then will have constipation.  She was started on Effexor and had an allergic reaction to it and since then she has had diarrhea.  Pain in the upper abdomen has been intermittent for the past few weeks but seems to be worse now.  She was evaluated her gastroenterologist last week and is scheduled for endoscopy and colonoscopy but her symptoms seem to be getting worse so she decided to come to the emergency department.  She denies fever.  Past Medical History:  Diagnosis Date   Barrett esophagus    Coronary artery disease    GERD (gastroesophageal reflux disease)    Headache    migraines 1-2/mo   Hypertension    Obesity (BMI 30-39.9)    Postmenopausal bleeding 01/20/2019     Physical Exam   Triage Vital Signs: ED Triage Vitals  Enc Vitals Group     BP 10/03/21 1059 (!) 152/78     Pulse Rate 10/03/21 1059 75     Resp 10/03/21 1059 16     Temp 10/03/21 1059 97.9 F (36.6 C)     Temp Source 10/03/21 1059 Oral     SpO2 10/03/21 1059 100 %     Weight 10/03/21 1107 133 lb (60.3 kg)     Height 10/03/21 1107 '4\' 9"'$  (1.448 m)     Head Circumference --      Peak Flow --      Pain Score 10/03/21 1106 10     Pain Loc --      Pain Edu? --      Excl. in [place]? --     Most recent vital signs: Vitals:   10/03/21 1300 10/03/21 1412  BP: (!) 95/57 105/65  Pulse: 62 64  Resp:  16  Temp:    SpO2: 94% 96%    General: Awake, no distress.  CV:  Good peripheral perfusion.  Resp:  Normal effort.  Abd:  No distention.  Diffuse tenderness of the upper and lower abdomen on the right side.    ED  Results / Procedures / Treatments   Labs (all labs ordered are listed, but only abnormal results are displayed) Labs Reviewed  CBC - Abnormal; Notable for the following components:      Result Value   Hemoglobin 15.7 (*)    HCT 47.5 (*)    All other components within normal limits  URINALYSIS, ROUTINE W REFLEX MICROSCOPIC - Abnormal; Notable for the following components:   Color, Urine YELLOW (*)    APPearance CLOUDY (*)    Leukocytes,Ua SMALL (*)    All other components within normal limits  LIPASE, BLOOD  COMPREHENSIVE METABOLIC PANEL  POC URINE PREG, ED     EKG  Not indicated.   RADIOLOGY  Ultrasound of the right upper quadrant negative for acute concerns.  CT abdomen and pelvis negative for acute concerns.  I have independently reviewed and interpreted imaging as well as reviewed report from radiology.  PROCEDURES:  Critical Care performed: No  Procedures   MEDICATIONS ORDERED IN ED:  Medications  sodium chloride 0.9 %  bolus 1,000 mL (0 mLs Intravenous Stopped 10/03/21 1306)  fentaNYL (SUBLIMAZE) injection 50 mcg (50 mcg Intravenous Given 10/03/21 1151)  ondansetron (ZOFRAN) injection 4 mg (4 mg Intravenous Given 10/03/21 1151)  iohexol (OMNIPAQUE) 300 MG/ML solution 100 mL (100 mLs Intravenous Contrast Given 10/03/21 1322)     IMPRESSION / MDM / ASSESSMENT AND PLAN / ED COURSE   I reviewed the triage vital signs and the nursing notes.  Differential diagnosis includes, but is not limited to: Acute cholecystitis, cholelithiasis, appendicitis, bowel obstruction, colitis.  Patient's presentation is most consistent with acute presentation with potential threat to life or bodily function.  56 year old female presenting to the emergency department for treatment and evaluation of abdominal pain.  See HPI for further details.  Clinical Course as of 10/03/21 1625  Fri Oct 03, 2021  1241 Ultrasound of the right upper quadrant is negative for acute concerns.  Plan  will be to proceed with CT of the abdomen and pelvis with contrast. [CT]    Clinical Course User Index [CT] Mikaiah Stoffer B, FNP   No acute findings in [place] or CT abd and pelvis. Will discharge her home with prescription for Levsin which will hopefully help with pain and nausea. She is to call and schedule a follow up with her GI specialist as well.  FINAL CLINICAL IMPRESSION(S) / ED DIAGNOSES   Final diagnoses:  Right upper quadrant abdominal pain     Rx / DC Orders   ED Discharge Orders          Ordered    Hyoscyamine Sulfate SL (LEVSIN/SL) 0.125 MG SUBL  Every 6 hours PRN        10/03/21 1425             Note:  This document was prepared using Dragon voice recognition software and may include unintentional dictation errors.   Victorino Dike, FNP 10/03/21 1625    Carrie Mew, MD 10/04/21 [phone]

## 2022-04-20 ENCOUNTER — Ambulatory Visit
Admission: RE | Admit: 2022-04-20 | Discharge: 2022-04-20 | Disposition: A | Discharge status: Home / Self Care | Payer: Managed Care, Other (non HMO) | Source: Ambulatory Visit | Attending: Nurse Practitioner | Admitting: Nurse Practitioner

## 2022-04-20 ENCOUNTER — Other Ambulatory Visit: Payer: Self-pay

## 2022-04-20 VITALS — BP 137/92 | HR 76 | Temp 98.7°F | Resp 20

## 2022-04-20 DIAGNOSIS — M545 Low back pain, unspecified: Secondary | ICD-10-CM | POA: Diagnosis present

## 2022-04-20 DIAGNOSIS — Z87442 Personal history of urinary calculi: Secondary | ICD-10-CM | POA: Diagnosis not present

## 2022-04-20 DIAGNOSIS — J019 Acute sinusitis, unspecified: Secondary | ICD-10-CM | POA: Diagnosis present

## 2022-04-20 DIAGNOSIS — B9689 Other specified bacterial agents as the cause of diseases classified elsewhere: Secondary | ICD-10-CM | POA: Diagnosis present

## 2022-04-20 LAB — POCT URINALYSIS DIP (MANUAL ENTRY)
Bilirubin, UA: NEGATIVE
Glucose, UA: NEGATIVE mg/dL
Ketones, POC UA: NEGATIVE mg/dL
Leukocytes, UA: NEGATIVE
Nitrite, UA: NEGATIVE
Protein Ur, POC: NEGATIVE mg/dL
Spec Grav, UA: 1.025 (ref 1.010–1.025)
Urobilinogen, UA: 0.2 E.U./dL
pH, UA: 5 (ref 5.0–8.0)

## 2022-04-20 MED ORDER — AMOXICILLIN-POT CLAVULANATE 875-125 MG PO TABS: 1.0000 | ORAL_TABLET | Freq: Two times a day (BID) | ORAL | 0 refills | Status: AC

## 2022-04-20 NOTE — Discharge Instructions (Addendum)
I believe you have a bacterial sinus infection.  Please start the Augmentin to treat this.  Please also start Mucinex 600 mg twice daily and make sure you are drinking plenty of liquids.  I wonder if the back pain is coming from the kidney stone.  The urinalysis shows a very small amount of blood today, otherwise it is unremarkable.  We have sent for urine culture and will call you in a couple of days if this comes back showing we need to treat with an alternate antibiotic.  You can take Tylenol or Advil as needed for the pain.  Please make sure you are drinking plenty of fluids.  If you develop severe pain or nausea/vomiting and are unable to keep fluids down, or fever greater than 101 F, please call emergency room.

## 2022-04-20 NOTE — ED Provider Notes (Signed)
[facility]    CSN: [id number] Arrival date & time: 04/20/22  0349      History   Chief Complaint Chief Complaint  Patient presents with   Nasal Congestion    Sore throat  and leg pain  body aches.  Maybe urinary tract infection. - Entered by patient   Facial Pain   Sore Throat   Back Pain    HPI Robyn Anderson is a 57 y.o. female.   Patient presents today for 2-week history of bodyaches, chills, slight cough, nasal congestion and runny nose, postnasal drainage, sore throat, sinus pressure above her eyes and in her cheeks, headache, decreased appetite, and fatigue.  Patient denies shortness of breath or wheezing, chest pain or chest congestion, ear pain, abdominal pain, nausea/vomiting, diarrhea, or known sick contacts.  Has been taking Advil and Nasacort which does not help very much.  Patient is also concerned about left low back pain that began a couple of days ago.  No dysuria, urinary frequency, or urgency.  Reports she has been voiding smaller amounts than normal.  No new urinary incontinence, foul urinary odor, hematuria, or abdominal pain.  No suprapubic pain or pressure or vaginal discharge.  Has not take anything for urinary symptoms so far.  Reports she has a known kidney stone in her left kidney.  Reports that she is due for a follow-up with urology, does not have an appointment scheduled yet.  Patient denies antibiotic use in the past 90 days.  Denies allergy to antibiotic therapy.    Past Medical History:  Diagnosis Date   Barrett esophagus    Coronary artery disease    GERD (gastroesophageal reflux disease)    Headache    migraines 1-2/mo   Hypertension    Obesity (BMI 30-39.9)    Postmenopausal bleeding 01/20/2019    Patient Active Problem List   Diagnosis Date Noted   Migraine with aura and without status migrainosus, not intractable 04/06/2019   Neuropathy 04/06/2019   Cervical stenosis (uterine cervix) 03/31/2019   Essential hypertension  04/22/2018   Hypertriglyceridemia 04/22/2018   History of chest pain 04/22/2018   Tobacco abuse 04/22/2018    Past Surgical History:  Procedure Laterality Date   ATHERECTOMY Right    renal   CARPAL TUNNEL RELEASE Left    CARPAL TUNNEL RELEASE Right 09/27/2015   Procedure: OPEN CARPAL TUNNEL RELEASE RIGHT HAND;  Surgeon: Leanor Kail, MD;  Location: [facility];  Service: Orthopedics;  Laterality: Right;   CESAREAN SECTION     x2   COLONOSCOPY     ESOPHAGOGASTRODUODENOSCOPY     HYSTEROSCOPY WITH D & C N/A 04/18/2019   Procedure: DILATATION AND CURETTAGE /HYSTEROSCOPY;  Surgeon: Aletha Halim, MD;  Location: [facility];  Service: Gynecology;  Laterality: N/A;   LAPAROSCOPIC SALPINGO OOPHERECTOMY Bilateral 04/18/2019   Procedure: LAPAROSCOPIC SALPINGO OOPHORECTOMY;  Surgeon: Aletha Halim, MD;  Location: [facility];  Service: Gynecology;  Laterality: Bilateral;   TUBAL LIGATION      OB History     Gravida  2   Para  2   Term      Preterm      AB      Living  2      SAB      IAB      Ectopic      Multiple      Live Births  2            Home  Medications    Prior to Admission medications   Medication Sig Start Date End Date Taking? Authorizing Provider  amoxicillin-clavulanate (AUGMENTIN) 875-125 MG tablet Take 1 tablet by mouth 2 (two) times daily for 7 days. 04/20/22 04/27/22 Yes Eulogio Bear, NP  albuterol (VENTOLIN HFA) 108 (90 Base) MCG/ACT inhaler Inhale into the lungs. 08/16/17   [provider]  albuterol (VENTOLIN HFA) 108 (90 Base) MCG/ACT inhaler Inhale 1-2 puffs into the lungs every 6 (six) hours as needed for wheezing or shortness of breath. 05/23/20   Hazel Sams, PA-C  Cholecalciferol (VITAMIN D3) 1.25 MG (50000 UT) CAPS Take 1 capsule by mouth once a week. 08/29/19   [provider]  esomeprazole (NEXIUM) 40 MG capsule Take 40 mg by mouth daily at 12 noon.    [provider]  Hyoscyamine Sulfate SL (LEVSIN/SL) 0.125 MG SUBL Place 0.125 mg under the tongue every 6 (six) hours as needed. 10/03/21   Triplett, Cari B, FNP  lidocaine (XYLOCAINE) 2 % solution Use as directed 15 mLs in the mouth or throat as needed for mouth pain. 05/23/20   Hazel Sams, PA-C  lisinopril (ZESTRIL) 30 MG tablet TAKE 1 TABLET(30 MG) BY MOUTH DAILY 12/15/19   Troy Sine, MD  nortriptyline (PAMELOR) 10 MG capsule Take 10 mg by mouth at bedtime. 1 capsule by mouth nightly for one week then increase to 2 capsules nightly    [provider]  ondansetron (ZOFRAN) 4 MG tablet Take 1 tablet (4 mg total) by mouth every 8 (eight) hours as needed for nausea or vomiting. 12/12/19   Darr, Edison Nasuti, PA-C  pregabalin (LYRICA) 50 MG capsule Take 50 mg by mouth 3 (three) times daily.    [provider]  SUMAtriptan (IMITREX) 25 MG tablet Take 25 mg by mouth every 2 (two) hours as needed for migraine. May repeat in 2 hours if headache persists or recurs.    [provider]    Family History Family History  Problem Relation Age of Onset   Cancer Mother    Hypertension Mother    Cancer Father    Hypertension Father    Hypertension Sister    Hypertension Brother    Stroke Maternal Grandmother    Diabetes Paternal Grandmother    Epilepsy Son     Social History Social History   Tobacco Use   Smoking status: Every Day    Packs/day: 0.50    Years: 30.00    Total pack years: 15.00    Types: Cigarettes   Smokeless tobacco: Never  Substance Use Topics   Alcohol use: No   Drug use: No     Allergies   Topiramate, Atorvastatin, Diclofenac, Hydrocodone-acetaminophen, and Vicodin [hydrocodone-acetaminophen]   Review of Systems Review of Systems Per HPI  Physical Exam Triage Vital Signs ED Triage Vitals [04/20/22 0942]  Enc Vitals Group     BP (!) 137/92     Pulse Rate 76     Resp 20     Temp 98.7 F (37.1 C)     Temp src      SpO2 98 %      Weight      Height      Head Circumference      Peak Flow      Pain Score 5     Pain Loc      Pain Edu?      Excl. in [place]?    No data found.  Updated Vital  Signs BP (!) 137/92   Pulse 76   Temp 98.7 F (37.1 C)   Resp 20   LMP 07/30/2017   SpO2 98%   Visual Acuity Right Eye Distance:   Left Eye Distance:   Bilateral Distance:    Right Eye Near:   Left Eye Near:    Bilateral Near:     Physical Exam Vitals and nursing note reviewed.  Constitutional:      General: She is not in acute distress.    Appearance: Normal appearance. She is not ill-appearing or toxic-appearing.  HENT:     Head: Normocephalic and atraumatic.     Right Ear: Ear canal and external ear normal. A middle ear effusion is present.     Left Ear: Ear canal and external ear normal. A middle ear effusion is present.     Nose: Congestion and rhinorrhea present.     Right Sinus: Maxillary sinus tenderness and frontal sinus tenderness present.     Left Sinus: Maxillary sinus tenderness and frontal sinus tenderness present.     Mouth/Throat:     Mouth: Mucous membranes are moist.     Pharynx: Oropharynx is clear. Posterior oropharyngeal erythema present. No oropharyngeal exudate.  Eyes:     General: No scleral icterus.    Extraocular Movements: Extraocular movements intact.  Cardiovascular:     Rate and Rhythm: Normal rate and regular rhythm.  Pulmonary:     Effort: Pulmonary effort is normal. No respiratory distress.     Breath sounds: Normal breath sounds. No wheezing, rhonchi or rales.  Abdominal:     General: Abdomen is flat. Bowel sounds are normal. There is no distension.     Palpations: Abdomen is soft.     Tenderness: There is no abdominal tenderness. There is no right CVA tenderness, left CVA tenderness or guarding.  Musculoskeletal:     Cervical back: Normal range of motion and neck supple.  Lymphadenopathy:     Cervical: Cervical adenopathy present.  Skin:    General: Skin is warm and  dry.     Capillary Refill: Capillary refill takes less than 2 seconds.     Coloration: Skin is not jaundiced or pale.     Findings: No erythema or rash.  Neurological:     Mental Status: She is alert and oriented to person, place, and time.  Psychiatric:        Behavior: Behavior is cooperative.      UC Treatments / Results  Labs (all labs ordered are listed, but only abnormal results are displayed) Labs Reviewed  POCT URINALYSIS DIP (MANUAL ENTRY) - Abnormal; Notable for the following components:      Result Value   Blood, UA trace-intact (*)    All other components within normal limits  URINE CULTURE    EKG   Radiology No results found.  Procedures Procedures (including critical care time)  Medications Ordered in UC Medications - No data to display  Initial Impression / Assessment and Plan / UC Course  I have reviewed the triage vital signs and the nursing notes.  Pertinent labs & imaging results that were available during my care of the patient were reviewed by me and considered in my medical decision making (see chart for details).   Patient is well-appearing, normotensive, afebrile, not tachycardic, not tachypneic, oxygenating well on room air.    Acute bacterial sinusitis Suspect bacterial at this point given length of symptoms Start Augmentin twice daily for 7 days  Supportive care discussed  including Mucinex, nasal saline rinses, continue Nasocort for effusion Er and return precautions discussed  Acute left-sided low back pain without sciatica History of kidney stones UA without signs of UTI today, trace RBC send for urine culture for rule out Patient has known left nephrolithiasis, suspect this may be attributing to symptoms Supportive care discussed, push fluids ER and return precautions discussed  The patient was given the opportunity to ask questions.  All questions answered to their satisfaction.  The patient is in agreement to this plan.    Final  Clinical Impressions(s) / UC Diagnoses   Final diagnoses:  Acute bacterial sinusitis  Acute left-sided low back pain without sciatica  History of kidney stones     Discharge Instructions      I believe you have a bacterial sinus infection.  Please start the Augmentin to treat this.  Please also start Mucinex 600 mg twice daily and make sure you are drinking plenty of liquids.  I wonder if the back pain is coming from the kidney stone.  The urinalysis shows a very small amount of blood today, otherwise it is unremarkable.  We have sent for urine culture and will call you in a couple of days if this comes back showing we need to treat with an alternate antibiotic.  You can take Tylenol or Advil as needed for the pain.  Please make sure you are drinking plenty of fluids.  If you develop severe pain or nausea/vomiting and are unable to keep fluids down, or fever greater than 101 F, please call emergency room.     ED Prescriptions     Medication Sig Dispense Auth. Provider   amoxicillin-clavulanate (AUGMENTIN) 875-125 MG tablet Take 1 tablet by mouth 2 (two) times daily for 7 days. 14 tablet Eulogio Bear, NP      PDMP not reviewed this encounter.   Eulogio Bear, NP 04/20/22 [phone]

## 2022-04-20 NOTE — ED Triage Notes (Signed)
Pt reports she thinks she has a sinus HA with post nasal drip. Pt also feels like she has a UTI with back pain.

## 2022-04-21 LAB — URINE CULTURE: Culture: NO GROWTH

## 2022-05-27 ENCOUNTER — Other Ambulatory Visit: Payer: Self-pay | Admitting: Physical Medicine & Rehabilitation

## 2022-05-27 DIAGNOSIS — M5010 Cervical disc disorder with radiculopathy, unspecified cervical region: Secondary | ICD-10-CM

## 2022-06-11 ENCOUNTER — Other Ambulatory Visit: Payer: Managed Care, Other (non HMO)

## 2022-06-15 NOTE — Discharge Instructions (Signed)

## 2022-06-16 ENCOUNTER — Ambulatory Visit
Admission: RE | Admit: 2022-06-16 | Discharge: 2022-06-16 | Disposition: A | Discharge status: Home / Self Care | Payer: Managed Care, Other (non HMO) | Source: Ambulatory Visit | Attending: Physical Medicine & Rehabilitation | Admitting: Physical Medicine & Rehabilitation

## 2022-06-16 DIAGNOSIS — M5010 Cervical disc disorder with radiculopathy, unspecified cervical region: Secondary | ICD-10-CM

## 2022-06-16 MED ORDER — TRIAMCINOLONE ACETONIDE 40 MG/ML IJ SUSP (RADIOLOGY)
60.0000 mg | Freq: Once | INTRAMUSCULAR | Status: AC
  Administered 2022-06-16: 60 mg via EPIDURAL

## 2022-06-16 MED ORDER — IOPAMIDOL (ISOVUE-M 300) INJECTION 61%
1.0000 mL | Freq: Once | INTRAMUSCULAR | Status: AC
  Administered 2022-06-16: 1 mL via EPIDURAL

## 2022-07-01 ENCOUNTER — Other Ambulatory Visit: Payer: Self-pay | Admitting: Physical Medicine & Rehabilitation

## 2022-07-01 DIAGNOSIS — M5010 Cervical disc disorder with radiculopathy, unspecified cervical region: Secondary | ICD-10-CM

## 2022-07-06 ENCOUNTER — Ambulatory Visit
Admission: RE | Admit: 2022-07-06 | Discharge: 2022-07-06 | Disposition: A | Discharge status: Home / Self Care | Payer: Managed Care, Other (non HMO) | Source: Ambulatory Visit

## 2022-07-06 VITALS — BP 143/91 | HR 66 | Temp 98.0°F | Resp 18

## 2022-07-06 DIAGNOSIS — R350 Frequency of micturition: Secondary | ICD-10-CM

## 2022-07-06 DIAGNOSIS — J309 Allergic rhinitis, unspecified: Secondary | ICD-10-CM | POA: Diagnosis not present

## 2022-07-06 DIAGNOSIS — R519 Headache, unspecified: Secondary | ICD-10-CM | POA: Diagnosis not present

## 2022-07-06 LAB — POCT URINALYSIS DIP (MANUAL ENTRY)
Bilirubin, UA: NEGATIVE
Blood, UA: NEGATIVE
Glucose, UA: NEGATIVE mg/dL
Ketones, POC UA: NEGATIVE mg/dL
Leukocytes, UA: NEGATIVE
Nitrite, UA: NEGATIVE
Protein Ur, POC: NEGATIVE mg/dL
Spec Grav, UA: 1.015 (ref 1.010–1.025)
Urobilinogen, UA: 0.2 E.U./dL
pH, UA: 5.5 (ref 5.0–8.0)

## 2022-07-06 MED ORDER — CETIRIZINE HCL 10 MG PO TABS: 10.0000 mg | ORAL_TABLET | Freq: Every day | ORAL | 0 refills | Status: AC

## 2022-07-06 MED ORDER — FLUTICASONE PROPIONATE 50 MCG/ACT NA SUSP: 1.0000 | Freq: Every day | NASAL | 0 refills | Status: AC

## 2022-07-06 MED ORDER — KETOROLAC TROMETHAMINE 30 MG/ML IJ SOLN
30.0000 mg | Freq: Once | INTRAMUSCULAR | Status: AC
  Administered 2022-07-06: 30 mg via INTRAMUSCULAR

## 2022-07-06 NOTE — Discharge Instructions (Signed)
Please start taking the oral Zyrtec and Flonase nasal spray for allergies We have given you a shot of Toradol today which is a strong anti-inflammatory for the headache Do not take any Advil, or other NSAIDs for the next 48 hours You can take Tylenol 500 to 1000 mg every 6 hours as needed for headache in the meantime The urine sample today looks great-no signs of infection Follow-up with primary care provider if your symptoms persist or worsen despite treatment

## 2022-07-06 NOTE — ED Triage Notes (Signed)
Chills, headache, nasal drainage, lower abdominal pain, lower back pain, urinary frequency, that started Friday. Taking advil and Fioricet for headache.

## 2022-07-06 NOTE — ED Provider Notes (Signed)
[facility]    CSN: [id number] Arrival date & time: 07/06/22  1032      History   Chief Complaint Chief Complaint  Patient presents with   Headache    Hea stopped up. Drainage down throat.  Sneezing.  Pain in abdominal area and back pain. - Entered by patient   Urinary Frequency    HPI RAFEEF Anderson is a 57 y.o. female.   Patient presents today for 3-day history of feeling like her head is "stopped up."  Also endorses dry cough and stable wheezing, nasal congestion and sneezing, postnasal drainage, headache across the top of her head that feels like her typical migraine, lower bilateral abdominal/groin pain, and low back pain.  Patient denies known fevers, body aches, congested cough, shortness of breath or chest pain, runny nose, sore throat, ear pain, nausea or vomiting, diarrhea, and decreased appetite.  She is tired "all the time" and is unable to tell me if she is more tired than normal.  Has taken 1 dose of migraine medication, over-the-counter Claritin-D which helped a little bit  Also having increased urinary urgency and frequency, lower suprapubic pressure, and voiding smaller amounts that began about 6 days ago.  She denies dysuria, hematuria, changes in the odor or color of her urine, and vaginal discharge.  Reports she has a known kidney stone in her left kidney, however reports "I can never pass them."    Past Medical History:  Diagnosis Date   Barrett esophagus    Coronary artery disease    GERD (gastroesophageal reflux disease)    Headache    migraines 1-2/mo   Hypertension    Obesity (BMI 30-39.9)    Postmenopausal bleeding 01/20/2019    Patient Active Problem List   Diagnosis Date Noted   Migraine with aura and without status migrainosus, not intractable 04/06/2019   Neuropathy 04/06/2019   Cervical stenosis (uterine cervix) 03/31/2019   Essential hypertension 04/22/2018   Hypertriglyceridemia 04/22/2018   History of chest pain 04/22/2018    Tobacco abuse 04/22/2018    Past Surgical History:  Procedure Laterality Date   ATHERECTOMY Right    renal   CARPAL TUNNEL RELEASE Left    CARPAL TUNNEL RELEASE Right 09/27/2015   Procedure: OPEN CARPAL TUNNEL RELEASE RIGHT HAND;  Surgeon: Leanor Kail, MD;  Location: [facility];  Service: Orthopedics;  Laterality: Right;   CESAREAN SECTION     x2   COLONOSCOPY     ESOPHAGOGASTRODUODENOSCOPY     HYSTEROSCOPY WITH D & C N/A 04/18/2019   Procedure: DILATATION AND CURETTAGE /HYSTEROSCOPY;  Surgeon: Aletha Halim, MD;  Location: [facility];  Service: Gynecology;  Laterality: N/A;   LAPAROSCOPIC SALPINGO OOPHERECTOMY Bilateral 04/18/2019   Procedure: LAPAROSCOPIC SALPINGO OOPHORECTOMY;  Surgeon: Aletha Halim, MD;  Location: [facility];  Service: Gynecology;  Laterality: Bilateral;   TUBAL LIGATION      OB History     Gravida  2   Para  2   Term      Preterm      AB      Living  2      SAB      IAB      Ectopic      Multiple      Live Births  2            Home Medications    Prior to Admission medications   Medication Sig Start Date End Date Taking? Authorizing Provider  albuterol (VENTOLIN HFA) 108 (90 Base) MCG/ACT inhaler Inhale into the lungs. 08/16/17  Yes [provider]  albuterol (VENTOLIN HFA) 108 (90 Base) MCG/ACT inhaler Inhale 1-2 puffs into the lungs every 6 (six) hours as needed for wheezing or shortness of breath. 05/23/20  Yes Hazel Sams, PA-C  butalbital-acetaminophen-caffeine (FIORICET) 50-325-40 MG tablet TAKE 1 TABLET BY MOUTH EVERY 4 HOURS FOR UP TO 10 DAYS AS NEEDED FOR PAIN 11/25/21  Yes [provider]  cetirizine (ZYRTEC) 10 MG tablet Take 1 tablet (10 mg total) by mouth daily. 07/06/22 08/05/22 Yes Eulogio Bear, NP  Cholecalciferol (VITAMIN D3) 1.25 MG (50000 UT) CAPS Take 1 capsule by mouth once a week. 08/29/19  Yes [provider]  esomeprazole (NEXIUM) 40  MG capsule Take 40 mg by mouth daily at 12 noon.   Yes [provider]  fluticasone (FLONASE) 50 MCG/ACT nasal spray Place 1 spray into both nostrils daily. 07/06/22  Yes Noemi Chapel A, NP  lisinopril (ZESTRIL) 30 MG tablet TAKE 1 TABLET(30 MG) BY MOUTH DAILY 12/15/19  Yes Troy Sine, MD  Hyoscyamine Sulfate SL (LEVSIN/SL) 0.125 MG SUBL Place 0.125 mg under the tongue every 6 (six) hours as needed. 10/03/21   Triplett, Cari B, FNP  lidocaine (XYLOCAINE) 2 % solution Use as directed 15 mLs in the mouth or throat as needed for mouth pain. 05/23/20   Hazel Sams, PA-C  nortriptyline (PAMELOR) 10 MG capsule Take 10 mg by mouth at bedtime. 1 capsule by mouth nightly for one week then increase to 2 capsules nightly    [provider]  ondansetron (ZOFRAN) 4 MG tablet Take 1 tablet (4 mg total) by mouth every 8 (eight) hours as needed for nausea or vomiting. 12/12/19   Darr, Edison Nasuti, PA-C  pregabalin (LYRICA) 50 MG capsule Take 50 mg by mouth 3 (three) times daily.    [provider]  SUMAtriptan (IMITREX) 25 MG tablet Take 25 mg by mouth every 2 (two) hours as needed for migraine. May repeat in 2 hours if headache persists or recurs.    [provider]    Family History Family History  Problem Relation Age of Onset   Cancer Mother    Hypertension Mother    Cancer Father    Hypertension Father    Hypertension Sister    Hypertension Brother    Stroke Maternal Grandmother    Diabetes Paternal Grandmother    Epilepsy Son     Social History Social History   Tobacco Use   Smoking status: Every Day    Packs/day: 0.50    Years: 30.00    Additional pack years: 0.00    Total pack years: 15.00    Types: Cigarettes   Smokeless tobacco: Never  Substance Use Topics   Alcohol use: No   Drug use: No     Allergies   Topiramate, Atorvastatin, Diclofenac, Hydrocodone-acetaminophen, and Vicodin [hydrocodone-acetaminophen]   Review of Systems Review of  Systems Per HPI  Physical Exam Triage Vital Signs ED Triage Vitals [07/06/22 1123]  Enc Vitals Group     BP (!) 143/91     Pulse Rate 66     Resp 18     Temp 98 F (36.7 C)     Temp Source Oral     SpO2 98 %     Weight      Height      Head Circumference      Peak Flow  Pain Score 7     Pain Loc      Pain Edu?      Excl. in [place]?    No data found.  Updated Vital Signs BP (!) 143/91 (BP Location: Right Arm)   Pulse 66   Temp 98 F (36.7 C) (Oral)   Resp 18   LMP 07/30/2017   SpO2 98%   Visual Acuity Right Eye Distance:   Left Eye Distance:   Bilateral Distance:    Right Eye Near:   Left Eye Near:    Bilateral Near:     Physical Exam Vitals and nursing note reviewed.  Constitutional:      General: She is not in acute distress.    Appearance: Normal appearance. She is not ill-appearing or toxic-appearing.  HENT:     Head: Normocephalic and atraumatic.     Right Ear: Tympanic membrane, ear canal and external ear normal.     Left Ear: Tympanic membrane, ear canal and external ear normal.     Nose: No congestion or rhinorrhea.     Mouth/Throat:     Mouth: Mucous membranes are moist.     Pharynx: Oropharynx is clear. Posterior oropharyngeal erythema present. No oropharyngeal exudate.     Comments: Cobblestoning of posterior pharynx Eyes:     General: No scleral icterus.    Extraocular Movements: Extraocular movements intact.  Cardiovascular:     Rate and Rhythm: Normal rate and regular rhythm.  Pulmonary:     Effort: Pulmonary effort is normal. No respiratory distress.     Breath sounds: Normal breath sounds. No wheezing, rhonchi or rales.  Abdominal:     General: Abdomen is flat. Bowel sounds are normal. There is no distension.     Palpations: Abdomen is soft.     Tenderness: There is no abdominal tenderness. There is no right CVA tenderness, left CVA tenderness, guarding or rebound. Negative signs include Murphy's sign, Rovsing's sign and McBurney's  sign.       Comments: Pain to groin in areas marked  Musculoskeletal:     Cervical back: Normal range of motion and neck supple.  Skin:    General: Skin is warm and dry.     Coloration: Skin is not jaundiced or pale.     Findings: No erythema or rash.  Neurological:     Mental Status: She is alert and oriented to person, place, and time.  Psychiatric:        Behavior: Behavior is cooperative.      UC Treatments / Results  Labs (all labs ordered are listed, but only abnormal results are displayed) Labs Reviewed  POCT URINALYSIS DIP (MANUAL ENTRY)    EKG   Radiology No results found.  Procedures Procedures (including critical care time)  Medications Ordered in UC Medications  ketorolac (TORADOL) 30 MG/ML injection 30 mg (30 mg Intramuscular Given 07/06/22 1207)    Initial Impression / Assessment and Plan / UC Course  I have reviewed the triage vital signs and the nursing notes.  Pertinent labs & imaging results that were available during my care of the patient were reviewed by me and considered in my medical decision making (see chart for details).   Patient is well-appearing, normotensive, afebrile, not tachycardic, not tachypneic, oxygenating well on room air.    1. Allergic rhinitis, unspecified seasonality, unspecified trigger Low suspicion for viral syndrome given no fever, other viral symptoms Treat with oral antihistamine like Zyrtec, nasal spray like Flonase Follow-up with primary care provider  with persistent or worsening symptoms despite treatment  2. Acute intractable headache, unspecified headache type Noted flags in history or on exam today Toradol 30 mg IM given today in urgent care Recommended no other NSAIDs for the next 48 hours Can use Tylenol as needed for headache Note given for work  3. Urinary frequency Urinalysis is unremarkable today; no CVA tenderness Low suspicion for UTI or pyelonephritis or kidney stone and discussed this with  patient Supportive care discussed with patient  The patient was given the opportunity to ask questions.  All questions answered to their satisfaction.  The patient is in agreement to this plan.    Final Clinical Impressions(s) / UC Diagnoses   Final diagnoses:  Allergic rhinitis, unspecified seasonality, unspecified trigger  Acute intractable headache, unspecified headache type  Urinary frequency     Discharge Instructions      Please start taking the oral Zyrtec and Flonase nasal spray for allergies We have given you a shot of Toradol today which is a strong anti-inflammatory for the headache Do not take any Advil, or other NSAIDs for the next 48 hours You can take Tylenol 500 to 1000 mg every 6 hours as needed for headache in the meantime The urine sample today looks great-no signs of infection Follow-up with primary care provider if your symptoms persist or worsen despite treatment     ED Prescriptions     Medication Sig Dispense Auth. Provider   cetirizine (ZYRTEC) 10 MG tablet Take 1 tablet (10 mg total) by mouth daily. 30 tablet Noemi Chapel A, NP   fluticasone (FLONASE) 50 MCG/ACT nasal spray Place 1 spray into both nostrils daily. 16 g Eulogio Bear, NP      PDMP not reviewed this encounter.   Eulogio Bear, NP 07/06/22 1355

## 2022-07-10 ENCOUNTER — Inpatient Hospital Stay: Admission: RE | Admit: 2022-07-10 | Payer: Managed Care, Other (non HMO) | Source: Ambulatory Visit

## 2022-07-14 ENCOUNTER — Emergency Department (HOSPITAL_COMMUNITY): Payer: Managed Care, Other (non HMO)

## 2022-07-14 ENCOUNTER — Other Ambulatory Visit: Payer: Self-pay

## 2022-07-14 ENCOUNTER — Emergency Department (HOSPITAL_COMMUNITY)
Admission: EM | Admit: 2022-07-14 | Discharge: 2022-07-14 | Disposition: A | Discharge status: Home / Self Care | Payer: Managed Care, Other (non HMO) | Attending: Student | Admitting: Student

## 2022-07-14 DIAGNOSIS — W109XXA Fall (on) (from) unspecified stairs and steps, initial encounter: Secondary | ICD-10-CM | POA: Insufficient documentation

## 2022-07-14 DIAGNOSIS — I2510 Atherosclerotic heart disease of native coronary artery without angina pectoris: Secondary | ICD-10-CM | POA: Insufficient documentation

## 2022-07-14 DIAGNOSIS — I10 Essential (primary) hypertension: Secondary | ICD-10-CM | POA: Diagnosis not present

## 2022-07-14 DIAGNOSIS — Z79899 Other long term (current) drug therapy: Secondary | ICD-10-CM | POA: Insufficient documentation

## 2022-07-14 DIAGNOSIS — F17210 Nicotine dependence, cigarettes, uncomplicated: Secondary | ICD-10-CM | POA: Insufficient documentation

## 2022-07-14 DIAGNOSIS — S92252A Displaced fracture of navicular [scaphoid] of left foot, initial encounter for closed fracture: Secondary | ICD-10-CM | POA: Insufficient documentation

## 2022-07-14 DIAGNOSIS — M25572 Pain in left ankle and joints of left foot: Secondary | ICD-10-CM | POA: Diagnosis present

## 2022-07-14 MED ORDER — NAPROXEN 375 MG PO TABS: 375.0000 mg | ORAL_TABLET | Freq: Two times a day (BID) | ORAL | 0 refills | Status: AC

## 2022-07-14 MED ORDER — LIDOCAINE 5 % EX PTCH
1.0000 | MEDICATED_PATCH | CUTANEOUS | Status: DC
Start: 2022-07-14 — End: 2022-07-14
  Administered 2022-07-14: 1 via TRANSDERMAL
  Filled 2022-07-14: qty 1

## 2022-07-14 MED ORDER — ACETAMINOPHEN 500 MG PO TABS
1000.0000 mg | ORAL_TABLET | Freq: Three times a day (TID) | ORAL | 0 refills | Status: AC
Start: 2022-07-14 — End: 2022-08-13

## 2022-07-14 MED ORDER — KETOROLAC TROMETHAMINE 15 MG/ML IJ SOLN
15.0000 mg | Freq: Once | INTRAMUSCULAR | Status: AC
  Administered 2022-07-14: 15 mg via INTRAMUSCULAR
  Filled 2022-07-14: qty 1

## 2022-07-14 NOTE — Discharge Instructions (Signed)
For pain:  - Acetaminophen 1000 mg three times daily (every 8 hours) - Naproxen 2 times daily (every 12 hours)

## 2022-07-14 NOTE — ED Triage Notes (Signed)
Pt presents following a fall and urgent care visit yesterday. Pt complains of Left Ankle/foot (no toe pain) and left elbow pain, abrasions on right knee. AxOx4

## 2022-07-14 NOTE — ED Provider Notes (Signed)
[facility] Provider Note  CSN: [id number] Arrival date & time: 07/14/22 [id number]  Chief Complaint(s) Fall  HPI Robyn Anderson is a 57 y.o. female With PMH Barrett's esophagus, GERD, HTN, HLD who presents emergency department for evaluation of elbow pain and ankle pain after a fall.  Patient states that yesterday she was walking up her steps, slipped on the steps and suffered a mechanical fall causing pain to the left ankle and left elbow.  She states that she was seen at an urgent care yesterday and ultimately placed in a ankle brace but pain has continued.  Currently denies chest pain, shortness of breath, abdominal pain, nausea, vomiting or other systemic symptoms.  Denies head strike or loss of consciousness during the fall.   Past Medical History Past Medical History:  Diagnosis Date   Barrett esophagus    Coronary artery disease    GERD (gastroesophageal reflux disease)    Headache    migraines 1-2/mo   Hypertension    Obesity (BMI 30-39.9)    Postmenopausal bleeding 01/20/2019   Patient Active Problem List   Diagnosis Date Noted   Migraine with aura and without status migrainosus, not intractable 04/06/2019   Neuropathy 04/06/2019   Cervical stenosis (uterine cervix) 03/31/2019   Essential hypertension 04/22/2018   Hypertriglyceridemia 04/22/2018   History of chest pain 04/22/2018   Tobacco abuse 04/22/2018   Home Medication(s) Prior to Admission medications   Medication Sig Start Date End Date Taking? Authorizing Provider  albuterol (VENTOLIN HFA) 108 (90 Base) MCG/ACT inhaler Inhale into the lungs. 08/16/17   [provider]  albuterol (VENTOLIN HFA) 108 (90 Base) MCG/ACT inhaler Inhale 1-2 puffs into the lungs every 6 (six) hours as needed for wheezing or shortness of breath. 05/23/20   Hazel Sams, PA-C  butalbital-acetaminophen-caffeine (FIORICET) 50-325-40 MG tablet TAKE 1 TABLET BY MOUTH EVERY 4 HOURS FOR UP TO 10  DAYS AS NEEDED FOR PAIN 11/25/21   [provider]  cetirizine (ZYRTEC) 10 MG tablet Take 1 tablet (10 mg total) by mouth daily. 07/06/22 08/05/22  Eulogio Bear, NP  Cholecalciferol (VITAMIN D3) 1.25 MG (50000 UT) CAPS Take 1 capsule by mouth once a week. 08/29/19   [provider]  esomeprazole (NEXIUM) 40 MG capsule Take 40 mg by mouth daily at 12 noon.    [provider]  fluticasone (FLONASE) 50 MCG/ACT nasal spray Place 1 spray into both nostrils daily. 07/06/22   Eulogio Bear, NP  Hyoscyamine Sulfate SL (LEVSIN/SL) 0.125 MG SUBL Place 0.125 mg under the tongue every 6 (six) hours as needed. 10/03/21   Triplett, Cari B, FNP  lidocaine (XYLOCAINE) 2 % solution Use as directed 15 mLs in the mouth or throat as needed for mouth pain. 05/23/20   Hazel Sams, PA-C  lisinopril (ZESTRIL) 30 MG tablet TAKE 1 TABLET(30 MG) BY MOUTH DAILY 12/15/19   Troy Sine, MD  nortriptyline (PAMELOR) 10 MG capsule Take 10 mg by mouth at bedtime. 1 capsule by mouth nightly for one week then increase to 2 capsules nightly    [provider]  ondansetron (ZOFRAN) 4 MG tablet Take 1 tablet (4 mg total) by mouth every 8 (eight) hours as needed for nausea or vomiting. 12/12/19   Darr, Edison Nasuti, PA-C  pregabalin (LYRICA) 50 MG capsule Take 50 mg by mouth 3 (three) times daily.    [provider]  SUMAtriptan (IMITREX) 25 MG tablet Take 25 mg by mouth  every 2 (two) hours as needed for migraine. May repeat in 2 hours if headache persists or recurs.    [provider]                                                                                                                                    Past Surgical History Past Surgical History:  Procedure Laterality Date   ATHERECTOMY Right    renal   CARPAL TUNNEL RELEASE Left    CARPAL TUNNEL RELEASE Right 09/27/2015   Procedure: OPEN CARPAL TUNNEL RELEASE RIGHT HAND;  Surgeon: Leanor Kail, MD;  Location: [facility];  Service: Orthopedics;  Laterality: Right;   CESAREAN SECTION     x2   COLONOSCOPY     ESOPHAGOGASTRODUODENOSCOPY     HYSTEROSCOPY WITH D & C N/A 04/18/2019   Procedure: DILATATION AND CURETTAGE /HYSTEROSCOPY;  Surgeon: Aletha Halim, MD;  Location: [facility];  Service: Gynecology;  Laterality: N/A;   LAPAROSCOPIC SALPINGO OOPHERECTOMY Bilateral 04/18/2019   Procedure: LAPAROSCOPIC SALPINGO OOPHORECTOMY;  Surgeon: Aletha Halim, MD;  Location: [facility];  Service: Gynecology;  Laterality: Bilateral;   TUBAL LIGATION     Family History Family History  Problem Relation Age of Onset   Cancer Mother    Hypertension Mother    Cancer Father    Hypertension Father    Hypertension Sister    Hypertension Brother    Stroke Maternal Grandmother    Diabetes Paternal Grandmother    Epilepsy Son     Social History Social History   Tobacco Use   Smoking status: Every Day    Packs/day: 0.50    Years: 30.00    Additional pack years: 0.00    Total pack years: 15.00    Types: Cigarettes   Smokeless tobacco: Never  Substance Use Topics   Alcohol use: No   Drug use: No   Allergies Topiramate, Atorvastatin, Diclofenac, Hydrocodone-acetaminophen, and Vicodin [hydrocodone-acetaminophen]  Review of Systems Review of Systems  Musculoskeletal:  Positive for arthralgias, joint swelling and myalgias.    Physical Exam Vital Signs  I have reviewed the triage vital signs Ht 4\' 9"  (1.448 m)   Wt 61.2 kg   LMP 07/30/2017   BMI 29.21 kg/m   Physical Exam Vitals and nursing note reviewed.  Constitutional:      General: She is not in acute distress.    Appearance: She is well-developed.  HENT:     Head: Normocephalic and atraumatic.  Eyes:     Conjunctiva/sclera: Conjunctivae normal.  Cardiovascular:     Rate and Rhythm: Normal rate and regular rhythm.     Heart sounds: No murmur heard. Pulmonary:     Effort: Pulmonary effort is  normal. No respiratory distress.  Musculoskeletal:        General: Tenderness present. No swelling.     Cervical back: Neck supple.  Skin:    General:  Skin is warm and dry.     Capillary Refill: Capillary refill takes less than 2 seconds.  Neurological:     Mental Status: She is alert.  Psychiatric:        Mood and Affect: Mood normal.     ED Results and Treatments Labs (all labs ordered are listed, but only abnormal results are displayed) Labs Reviewed - No data to display                                                                                                                        Radiology No results found.  Pertinent labs & imaging results that were available during my care of the patient were reviewed by me and considered in my medical decision making (see MDM for details).  Medications Ordered in ED Medications  ketorolac (TORADOL) 15 MG/ML injection 15 mg (has no administration in time range)  lidocaine (LIDODERM) 5 % 1 patch (has no administration in time range)                                                                                                                                     Procedures Procedures  (including critical care time)  Medical Decision Making / ED Course   This patient presents to the ED for concern of elbow pain, ankle pain, this involves an extensive number of treatment options, and is a complaint that carries with it a high risk of complications and morbidity.  The differential diagnosis includes fracture, ligamentous injury, sprain, contusion, hematoma  MDM: Patient seen emergency room for evaluation of a fall with ankle and elbow pain.  Physical exam with tenderness over the dorsum of the left foot but is otherwise unremarkable.  X-rays showing an acute navicular avulsion fracture on the left but is otherwise unremarkable.  Patient placed in a cam boot, given crutches and pain control.  We did have a discussion about Naprosyn  use given patient's history of Barrett's esophagus and I informed her that we should really only be doing this for 1 week or less to decrease her risk of peptic ulcer disease.  I sent an ambulatory referral to podiatry and patient will require outpatient follow-up.  At this time she does not meet inpatient criteria for admission.   Additional history obtained:  -External records from outside source obtained and reviewed including: Chart review including previous notes, labs, imaging, consultation  notes   Lab Tests: -I ordered, reviewed, and interpreted labs.   The pertinent results include:   Labs Reviewed - No data to display      Imaging Studies ordered: I ordered imaging studies including x-ray elbow, ankle I independently visualized and interpreted imaging. I agree with the radiologist interpretation   Medicines ordered and prescription drug management: Meds ordered this encounter  Medications   ketorolac (TORADOL) 15 MG/ML injection 15 mg   lidocaine (LIDODERM) 5 % 1 patch    -I have reviewed the patients home medicines and have made adjustments as needed  Critical interventions none Cardiac Monitoring: The patient was maintained on a cardiac monitor.  I personally viewed and interpreted the cardiac monitored which showed an underlying rhythm of: NSR  Social Determinants of Health:  Factors impacting patients care include: none   Reevaluation: After the interventions noted above, I reevaluated the patient and found that they have :improved  Co morbidities that complicate the patient evaluation  Past Medical History:  Diagnosis Date   Barrett esophagus    Coronary artery disease    GERD (gastroesophageal reflux disease)    Headache    migraines 1-2/mo   Hypertension    Obesity (BMI 30-39.9)    Postmenopausal bleeding 01/20/2019      Dispostion: I considered admission for this patient, but at this time she does not meet inpatient criteria for admission she  is safe for discharge with outpatient follow-up     Final Clinical Impression(s) / ED Diagnoses Final diagnoses:  None     @PCDICTATION @    Teressa Lower, MD 07/14/22 1002

## 2022-07-24 ENCOUNTER — Emergency Department
Admission: EM | Admit: 2022-07-24 | Discharge: 2022-07-25 | Disposition: A | Discharge status: Home / Self Care | Payer: No Typology Code available for payment source | Attending: Emergency Medicine | Admitting: Emergency Medicine

## 2022-07-24 DIAGNOSIS — W228XXA Striking against or struck by other objects, initial encounter: Secondary | ICD-10-CM | POA: Diagnosis not present

## 2022-07-24 DIAGNOSIS — Y990 Civilian activity done for income or pay: Secondary | ICD-10-CM | POA: Diagnosis not present

## 2022-07-24 DIAGNOSIS — I10 Essential (primary) hypertension: Secondary | ICD-10-CM | POA: Diagnosis not present

## 2022-07-24 DIAGNOSIS — S0993XA Unspecified injury of face, initial encounter: Secondary | ICD-10-CM | POA: Insufficient documentation

## 2022-07-24 DIAGNOSIS — F17200 Nicotine dependence, unspecified, uncomplicated: Secondary | ICD-10-CM | POA: Diagnosis not present

## 2022-07-24 NOTE — ED Triage Notes (Signed)
Pt states she was at work changing a filter and when she pulled the filter off it came back and hit her in the face. Injuring her nose and under her left eye. Pt states she has small bump on nose. No bleeding or bruising at this time.

## 2022-07-25 MED ORDER — IBUPROFEN 400 MG PO TABS
400.0000 mg | ORAL_TABLET | Freq: Once | ORAL | Status: AC
  Administered 2022-07-25: 400 mg via ORAL
  Filled 2022-07-25: qty 1

## 2022-07-25 NOTE — Discharge Instructions (Signed)
You may have bruised or have a mild break to your nose.  You can use ice and take Tylenol Motrin for pain.  You may have bruising tomorrow.

## 2022-07-25 NOTE — ED Provider Notes (Signed)
[facility] Provider Note    Event Date/Time   First MD Initiated Contact with Patient 07/24/22 2356     (approximate)   History   Facial Injury   HPI  Robyn Anderson is a 57 y.o. female past medical history of obesity hypertension who presents with injury to her face.  Patient works at [employer] which she was using a UA machine when part of the pieces of machine got stuck and she was pulling on it and that fell back and hit her on the nasal bridge and under the left eye.  Denies loss of consciousness.  She was concerned about her nose being broken.  Denies nasal bleeding no vision change numbness tingling weakness.  He is not anticoagulated.    Past Medical History:  Diagnosis Date   Barrett esophagus    Coronary artery disease    GERD (gastroesophageal reflux disease)    Headache    migraines 1-2/mo   Hypertension    Obesity (BMI 30-39.9)    Postmenopausal bleeding 01/20/2019    Patient Active Problem List   Diagnosis Date Noted   Migraine with aura and without status migrainosus, not intractable 04/06/2019   Neuropathy 04/06/2019   Cervical stenosis (uterine cervix) 03/31/2019   Essential hypertension 04/22/2018   Hypertriglyceridemia 04/22/2018   History of chest pain 04/22/2018   Tobacco abuse 04/22/2018     Physical Exam  Triage Vital Signs: ED Triage Vitals  Enc Vitals Group     BP 07/24/22 2135 (!) 149/93     Pulse Rate 07/24/22 2135 80     Resp 07/24/22 2135 18     Temp 07/24/22 2135 97.9 F (36.6 C)     Temp src --      SpO2 07/24/22 2135 100 %     Weight 07/24/22 2134 135 lb (61.2 kg)     Height 07/24/22 2134 4\' 9"  (1.448 m)     Head Circumference --      Peak Flow --      Pain Score 07/24/22 2134 7     Pain Loc --      Pain Edu? --      Excl. in GC? --     Most recent vital signs: Vitals:   07/24/22 2135  BP: (!) 149/93  Pulse: 80  Resp: 18  Temp: 97.9 F (36.6 C)  SpO2: 100%     General: Awake, no distress.   CV:  Good peripheral perfusion.  Resp:  Normal effort.  Abd:  No distention.  Neuro:             Awake, Alert, Oriented x 3  Other:  Mild swelling just distal to the nasal bridge but nasal septum is well aligned no significant deformity no septal hematoma Pupils equal round reactive to light extraocular movements are full mild inferior orbital swelling on the left but no deformity no significant tenderness no diplopia with upward gaze   ED Results / Procedures / Treatments  Labs (all labs ordered are listed, but only abnormal results are displayed) Labs Reviewed - No data to display   EKG     RADIOLOGY    PROCEDURES:  Critical Care performed: No  Procedures   MEDICATIONS ORDERED IN ED: Medications  ibuprofen (ADVIL) tablet 400 mg (has no administration in time range)     IMPRESSION / MDM / ASSESSMENT AND PLAN / ED COURSE  I reviewed the triage vital signs and the nursing notes.  Patient's presentation is most consistent with acute, uncomplicated illness.  Differential diagnosis includes, but is not limited to, nasal bone fracture, contusion, low suspicion for facial bone fracture  Patient is a 58 year old female who presents with facial injury.  She works at [employer] was using one of the urine machines when part of the metal piece that she uses with the machine was stuck and she pulled back on it and then hit her primarily in the nose.  She did not have loss of consciousness.  Denies vision change numbness ting weakness or other neurologic symptoms other than a mild headache.  She is not anticoagulated.  No vomiting.  She was concerned her nose could be broken.  On exam she does not have any significant facial deformity.  She has some mild tenderness distal to the nasal bridge with a small amount of swelling but not significant nasal septum is well aligned no septal hematoma or bleeding.  I have low suspicion for facial bone fracture she has  some just some mild swelling and tenderness infraorbitally on the left but no diplopia with upward gaze and extraocular movements are intact with good pupillary response.  Neurologic Sam otherwise is nonfocal.  Suspect contusion to the nose.  Even if she had a small nasal bone fracture it would not be operative given how well aligned her noses.  Discussed ice and Tylenol Motrin.  She is appropriate for discharge.       FINAL CLINICAL IMPRESSION(S) / ED DIAGNOSES   Final diagnoses:  Facial injury, initial encounter     Rx / DC Orders   ED Discharge Orders     None        Note:  This document was prepared using Dragon voice recognition software and may include unintentional dictation errors.   Georga Hacking, MD 07/25/22 [id number]

## 2022-07-27 ENCOUNTER — Encounter: Payer: Self-pay | Admitting: Podiatry

## 2022-07-27 ENCOUNTER — Ambulatory Visit (INDEPENDENT_AMBULATORY_CARE_PROVIDER_SITE_OTHER): Payer: Managed Care, Other (non HMO)

## 2022-07-27 ENCOUNTER — Ambulatory Visit: Payer: Managed Care, Other (non HMO) | Admitting: Podiatry

## 2022-07-27 DIAGNOSIS — M76822 Posterior tibial tendinitis, left leg: Secondary | ICD-10-CM

## 2022-07-27 NOTE — Progress Notes (Signed)
  Subjective:  Patient ID: Robyn Anderson, female    DOB: 1966-03-20,   MRN: [id number]  Chief Complaint  Patient presents with   Ankle Injury    Left foot ankle injury.     57 y.o. female presents for concern of foot fracture. Relates she fell two weeks ago and was seen in the ED and given a CAM boot and X-rays taken. Relates she had a fracture and was told to follow-up . Denies any other pedal complaints. Denies n/v/f/c.   Past Medical History:  Diagnosis Date   Barrett esophagus    Coronary artery disease    GERD (gastroesophageal reflux disease)    Headache    migraines 1-2/mo   Hypertension    Obesity (BMI 30-39.9)    Postmenopausal bleeding 01/20/2019    Objective:  Physical Exam: Vascular: DP/PT pulses 2/4 bilateral. CFT <3 seconds. Normal hair growth on digits. No edema.  Skin. No lacerations or abrasions bilateral feet.  Musculoskeletal: MMT 5/5 bilateral lower extremities in DF, PF, Inversion and Eversion. Deceased ROM in DF of ankle joint.  Neurological: Sensation intact to light touch.   Assessment:   1. Posterior tibial tendon dysfunction (PTTD) of left lower extremity      Plan:  Patient was evaluated and treated and all questions answered. -Xrays reviewed. No acute fractures or dislocations noted today.  Reviewed X-rays from ED and noted from urgent care and ED.  -Discussed treatement options for navicular fracture vs injury to the posterior tibial tendon; risks, alternatives, and benefits explained. Discussed PTTD diagnosis and treatment options with patient. Stretching exercises discussed and handout dispensed. Continue with tyelnol for pain.  -Continue in CAM boot for three more weeks than transition for last week with ankle brace.  -Recommend protection, rest, ice, elevation daily until symptoms improve -Patient to return to office in 4 weeks for serial x-rays to assess healing  or sooner if condition worsens.   Louann Sjogren, DPM

## 2022-07-27 NOTE — Patient Instructions (Signed)
Posterior Tibial Tendinitis Rehab Ask your health care provider which exercises are safe for you. Do exercises exactly as told by your health care provider and adjust them as directed. It is normal to feel mild stretching, pulling, tightness, or discomfort as you do these exercises. Stop right away if you feel sudden pain or your pain gets worse. Do not begin these exercises until told by your health care provider. Stretching and range-of-motion exercises These exercises warm up your muscles and joints and improve the movement and flexibility in your ankle and foot. These exercises may also help to relieve pain. Standing wall calf stretch, knee straight  Stand with your hands against a wall. Extend your left / right leg behind you, and bend your front knee slightly. If directed, place a folded washcloth under the arch of your foot for support. Point the toes of your back foot slightly inward. Keeping your heels on the floor and your back knee straight, shift your weight toward the wall. Do not allow your back to arch. You should feel a gentle stretch in your upper left / right calf. Hold this position for __________ seconds. Repeat __________ times. Complete this exercise __________ times a day. Standing wall calf stretch, knee bent Stand with your hands against a wall. Extend your left / right leg behind you, and bend your front knee slightly. If directed, place a folded washcloth under the arch of your foot for support. Point the toes of your back foot slightly inward. Unlock your back knee so it is bent. Keep your heels on the floor. You should feel a gentle stretch deep in your lower left / right calf. Hold this position for __________ seconds. Repeat __________ times. Complete this exercise __________ times a day. Strengthening exercises These exercises build strength and endurance in your ankle and foot. Endurance is the ability to use your muscles for a long time, even after they get  tired. Ankle inversion with band Secure one end of a rubber exercise band or tubing to a fixed object, such as a table leg or a pole, that will stay still when the band is pulled. Loop the other end of the band around the middle of your left / right foot. Sit on the floor facing the object with your left / right leg extended. The band or tube should be slightly tense when your foot is relaxed. Leading with your big toe, slowly bring your left / right foot and ankle inward, toward your other foot (inversion). Hold this position for __________ seconds. Slowly return your foot to the starting position. Repeat __________ times. Complete this exercise __________ times a day. Towel curls  Sit in a chair on a non-carpeted surface, and put your feet on the floor. Place a towel in front of your feet. If told by your health care provider, add a __________ weight to the end of the towel. Keeping your heel on the floor, put your left / right foot on the towel. Pull the towel toward you by grabbing the towel with your toes and curling them under. Keep your heel on the floor while you do this. Let your toes relax. Grab the towel with your toes again. Keep going until the towel is completely underneath your foot. Repeat __________ times. Complete this exercise __________ times a day. Balance exercise This exercise improves or maintains your balance. Balance is important in preventing falls. Single leg stand Without wearing shoes, stand near a railing or in a doorway. You may hold   on to the railing or door frame as needed for balance. Stand on your left / right foot. Keep your big toe down on the floor and try to keep your arch lifted. If balancing in this position is too easy, try the exercise with your eyes closed or while standing on a pillow. Hold this position for __________ seconds. Repeat __________ times. Complete this exercise __________ times a day. This information is not intended to replace  advice given to you by your health care provider. Make sure you discuss any questions you have with your health care provider. Document Revised: 08/02/2018 Document Reviewed: 06/08/2018 Elsevier Patient Education  2023 [employer] Inc[employer]  
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
## 2022-07-29 ENCOUNTER — Ambulatory Visit: Payer: Managed Care, Other (non HMO) | Attending: Physical Medicine & Rehabilitation

## 2022-07-29 DIAGNOSIS — M6281 Muscle weakness (generalized): Secondary | ICD-10-CM | POA: Insufficient documentation

## 2022-07-29 DIAGNOSIS — M5459 Other low back pain: Secondary | ICD-10-CM | POA: Insufficient documentation

## 2022-07-29 DIAGNOSIS — M5412 Radiculopathy, cervical region: Secondary | ICD-10-CM | POA: Diagnosis present

## 2022-07-29 DIAGNOSIS — R293 Abnormal posture: Secondary | ICD-10-CM | POA: Insufficient documentation

## 2022-07-29 DIAGNOSIS — M542 Cervicalgia: Secondary | ICD-10-CM | POA: Diagnosis present

## 2022-07-31 ENCOUNTER — Ambulatory Visit: Payer: Managed Care, Other (non HMO) | Admitting: Physical Therapy

## 2022-07-31 DIAGNOSIS — M5412 Radiculopathy, cervical region: Secondary | ICD-10-CM

## 2022-07-31 DIAGNOSIS — M6281 Muscle weakness (generalized): Secondary | ICD-10-CM

## 2022-07-31 DIAGNOSIS — M542 Cervicalgia: Secondary | ICD-10-CM | POA: Diagnosis not present

## 2022-07-31 DIAGNOSIS — R293 Abnormal posture: Secondary | ICD-10-CM
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
## 2022-08-05 ENCOUNTER — Other Ambulatory Visit: Payer: Self-pay | Admitting: Physical Medicine & Rehabilitation

## 2022-08-05 ENCOUNTER — Ambulatory Visit: Payer: Managed Care, Other (non HMO) | Admitting: Physical Therapy

## 2022-08-05 DIAGNOSIS — M542 Cervicalgia: Secondary | ICD-10-CM | POA: Diagnosis not present

## 2022-08-05 DIAGNOSIS — M5441 Lumbago with sciatica, right side: Secondary | ICD-10-CM

## 2022-08-05 DIAGNOSIS — M5412 Radiculopathy, cervical region: Secondary | ICD-10-CM

## 2022-08-05 DIAGNOSIS — R293 Abnormal posture: Secondary | ICD-10-CM

## 2022-08-05 DIAGNOSIS — M6281 Muscle weakness (generalized): Secondary | ICD-10-CM

## 2022-08-05 DIAGNOSIS — M5442 Lumbago with sciatica, left side: Secondary | ICD-10-CM
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
## 2022-08-07 ENCOUNTER — Ambulatory Visit: Payer: Managed Care, Other (non HMO) | Admitting: Physical Therapy

## 2022-08-12 ENCOUNTER — Ambulatory Visit: Payer: Managed Care, Other (non HMO)

## 2022-08-12 DIAGNOSIS — M542 Cervicalgia: Secondary | ICD-10-CM | POA: Diagnosis not present

## 2022-08-12 DIAGNOSIS — R293 Abnormal posture: Secondary | ICD-10-CM

## 2022-08-12 DIAGNOSIS — M5412 Radiculopathy, cervical region: Secondary | ICD-10-CM

## 2022-08-12 DIAGNOSIS — M5459 Other low back pain: Secondary | ICD-10-CM

## 2022-08-12 DIAGNOSIS — M6281 Muscle weakness (generalized): Secondary | ICD-10-CM
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
## 2022-08-14 ENCOUNTER — Ambulatory Visit: Payer: Managed Care, Other (non HMO) | Admitting: Physical Therapy

## 2022-08-19 ENCOUNTER — Ambulatory Visit: Payer: Managed Care, Other (non HMO) | Attending: Physical Medicine & Rehabilitation | Admitting: Physical Therapy

## 2022-08-19 DIAGNOSIS — M6281 Muscle weakness (generalized): Secondary | ICD-10-CM | POA: Diagnosis present

## 2022-08-19 DIAGNOSIS — M5412 Radiculopathy, cervical region: Secondary | ICD-10-CM | POA: Diagnosis present

## 2022-08-19 DIAGNOSIS — M542 Cervicalgia: Secondary | ICD-10-CM

## 2022-08-19 DIAGNOSIS — R293 Abnormal posture: Secondary | ICD-10-CM

## 2022-08-19 DIAGNOSIS — M5459 Other low back pain: Secondary | ICD-10-CM
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
## 2022-08-21 ENCOUNTER — Ambulatory Visit: Payer: Managed Care, Other (non HMO) | Admitting: Physical Therapy

## 2022-08-21 ENCOUNTER — Encounter: Payer: Self-pay | Admitting: Physical Medicine & Rehabilitation

## 2022-08-21 ENCOUNTER — Encounter: Payer: Self-pay | Admitting: Physical Therapy

## 2022-08-21 DIAGNOSIS — M5412 Radiculopathy, cervical region: Secondary | ICD-10-CM

## 2022-08-21 DIAGNOSIS — M5459 Other low back pain: Secondary | ICD-10-CM

## 2022-08-21 DIAGNOSIS — M542 Cervicalgia: Secondary | ICD-10-CM

## 2022-08-21 DIAGNOSIS — M6281 Muscle weakness (generalized): Secondary | ICD-10-CM

## 2022-08-21 DIAGNOSIS — R293 Abnormal posture: Secondary | ICD-10-CM
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
## 2022-08-26 ENCOUNTER — Encounter: Payer: Managed Care, Other (non HMO) | Admitting: Physical Therapy

## 2022-08-28 ENCOUNTER — Encounter: Payer: Managed Care, Other (non HMO) | Admitting: Physical Therapy

## 2022-08-31 ENCOUNTER — Ambulatory Visit: Payer: Managed Care, Other (non HMO) | Admitting: Podiatry

## 2022-09-02 ENCOUNTER — Ambulatory Visit: Payer: Managed Care, Other (non HMO) | Admitting: Physical Therapy

## 2022-09-02 DIAGNOSIS — M5459 Other low back pain: Secondary | ICD-10-CM

## 2022-09-02 DIAGNOSIS — M6281 Muscle weakness (generalized): Secondary | ICD-10-CM

## 2022-09-02 DIAGNOSIS — M542 Cervicalgia: Secondary | ICD-10-CM | POA: Diagnosis not present

## 2022-09-02 DIAGNOSIS — R293 Abnormal posture: Secondary | ICD-10-CM

## 2022-09-02 DIAGNOSIS — M5412 Radiculopathy, cervical region: Secondary | ICD-10-CM
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
## 2022-09-04 ENCOUNTER — Ambulatory Visit: Payer: Managed Care, Other (non HMO) | Admitting: Physical Therapy

## 2022-09-09 ENCOUNTER — Ambulatory Visit: Payer: Managed Care, Other (non HMO) | Admitting: Physical Therapy

## 2022-09-10 ENCOUNTER — Ambulatory Visit
Admission: RE | Admit: 2022-09-10 | Discharge: 2022-09-10 | Disposition: A | Discharge status: Home / Self Care | Payer: Managed Care, Other (non HMO) | Source: Ambulatory Visit | Attending: Physical Medicine & Rehabilitation | Admitting: Physical Medicine & Rehabilitation

## 2022-09-10 DIAGNOSIS — M5441 Lumbago with sciatica, right side: Secondary | ICD-10-CM

## 2022-09-10 DIAGNOSIS — M5442 Lumbago with sciatica, left side: Secondary | ICD-10-CM

## 2022-09-11 ENCOUNTER — Ambulatory Visit: Payer: Managed Care, Other (non HMO) | Admitting: Physical Therapy

## 2022-09-11 ENCOUNTER — Encounter: Payer: Self-pay | Admitting: Physical Therapy

## 2022-09-11 DIAGNOSIS — R293 Abnormal posture: Secondary | ICD-10-CM

## 2022-09-11 DIAGNOSIS — M542 Cervicalgia: Secondary | ICD-10-CM

## 2022-09-11 DIAGNOSIS — M5412 Radiculopathy, cervical region: Secondary | ICD-10-CM

## 2022-09-11 DIAGNOSIS — M6281 Muscle weakness (generalized): Secondary | ICD-10-CM
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
## 2022-09-13 ENCOUNTER — Encounter (HOSPITAL_COMMUNITY): Payer: Self-pay

## 2022-09-13 ENCOUNTER — Emergency Department (HOSPITAL_COMMUNITY)
Admission: EM | Admit: 2022-09-13 | Discharge: 2022-09-13 | Disposition: A | Discharge status: Home / Self Care | Payer: Managed Care, Other (non HMO) | Attending: Emergency Medicine | Admitting: Emergency Medicine

## 2022-09-13 ENCOUNTER — Emergency Department (HOSPITAL_COMMUNITY): Payer: Managed Care, Other (non HMO)

## 2022-09-13 ENCOUNTER — Other Ambulatory Visit: Payer: Self-pay

## 2022-09-13 DIAGNOSIS — I10 Essential (primary) hypertension: Secondary | ICD-10-CM | POA: Diagnosis not present

## 2022-09-13 DIAGNOSIS — I2510 Atherosclerotic heart disease of native coronary artery without angina pectoris: Secondary | ICD-10-CM | POA: Diagnosis not present

## 2022-09-13 DIAGNOSIS — R251 Tremor, unspecified: Secondary | ICD-10-CM

## 2022-09-13 LAB — CBC WITH DIFFERENTIAL/PLATELET
Abs Immature Granulocytes: 0.04 10*3/uL (ref 0.00–0.07)
Basophils Absolute: 0.1 10*3/uL (ref 0.0–0.1)
Basophils Relative: 1 %
Eosinophils Absolute: 0.2 10*3/uL (ref 0.0–0.5)
Eosinophils Relative: 2 %
HCT: 48.5 % — ABNORMAL HIGH (ref 36.0–46.0)
Hemoglobin: 16.1 g/dL — ABNORMAL HIGH (ref 12.0–15.0)
Immature Granulocytes: 1 %
Lymphocytes Relative: 45 %
Lymphs Abs: 4 10*3/uL (ref 0.7–4.0)
MCH: 31.8 pg (ref 26.0–34.0)
MCHC: 33.2 g/dL (ref 30.0–36.0)
MCV: 95.8 fL (ref 80.0–100.0)
Monocytes Absolute: 0.8 10*3/uL (ref 0.1–1.0)
Monocytes Relative: 9 %
Neutro Abs: 3.7 10*3/uL (ref 1.7–7.7)
Neutrophils Relative %: 42 %
Platelets: 296 10*3/uL (ref 150–400)
RBC: 5.06 MIL/uL (ref 3.87–5.11)
RDW: 13.3 % (ref 11.5–15.5)
WBC: 8.8 10*3/uL (ref 4.0–10.5)
nRBC: 0 % (ref 0.0–0.2)

## 2022-09-13 LAB — URINALYSIS, ROUTINE W REFLEX MICROSCOPIC
Bilirubin Urine: NEGATIVE
Glucose, UA: NEGATIVE mg/dL
Hgb urine dipstick: NEGATIVE
Ketones, ur: NEGATIVE mg/dL
Leukocytes,Ua: NEGATIVE
Nitrite: NEGATIVE
Protein, ur: NEGATIVE mg/dL
Specific Gravity, Urine: 1.005 (ref 1.005–1.030)
pH: 6 (ref 5.0–8.0)

## 2022-09-13 LAB — COMPREHENSIVE METABOLIC PANEL
ALT: 25 U/L (ref 0–44)
AST: 24 U/L (ref 15–41)
Albumin: 3.4 g/dL — ABNORMAL LOW (ref 3.5–5.0)
Alkaline Phosphatase: 90 U/L (ref 38–126)
Anion gap: 9 (ref 5–15)
BUN: 12 mg/dL (ref 6–20)
CO2: 26 mmol/L (ref 22–32)
Calcium: 9.2 mg/dL (ref 8.9–10.3)
Chloride: 103 mmol/L (ref 98–111)
Creatinine, Ser: 0.94 mg/dL (ref 0.44–1.00)
GFR, Estimated: >60 mL/min (ref >60)
Glucose, Bld: 97 mg/dL (ref 70–99)
Potassium: 3.7 mmol/L (ref 3.5–5.1)
Sodium: 138 mmol/L (ref 135–145)
Total Bilirubin: 0.6 mg/dL (ref 0.3–1.2)
Total Protein: 6.8 g/dL (ref 6.5–8.1)

## 2022-09-13 LAB — MAGNESIUM: Magnesium: 2.1 mg/dL (ref 1.7–2.4)

## 2022-09-13 MED ORDER — METOCLOPRAMIDE HCL 5 MG/ML IJ SOLN
10.0000 mg | Freq: Once | INTRAMUSCULAR | Status: AC
  Administered 2022-09-13: 10 mg via INTRAVENOUS
  Filled 2022-09-13: qty 2

## 2022-09-13 MED ORDER — GADOBUTROL 1 MMOL/ML IV SOLN
6.0000 mL | Freq: Once | INTRAVENOUS | Status: AC | PRN
  Administered 2022-09-13: 6 mL via INTRAVENOUS

## 2022-09-13 MED ORDER — LACTATED RINGERS IV BOLUS
500.0000 mL | Freq: Once | INTRAVENOUS | Status: AC
  Administered 2022-09-13: 500 mL via INTRAVENOUS

## 2022-09-13 MED ORDER — DIPHENHYDRAMINE HCL 50 MG/ML IJ SOLN
12.5000 mg | Freq: Once | INTRAMUSCULAR | Status: AC
  Administered 2022-09-13: 12.5 mg via INTRAVENOUS
  Filled 2022-09-13: qty 1

## 2022-09-13 NOTE — Discharge Instructions (Signed)
Follow-up with your neurologist as soon as possible.  Return to the emergency department for any new or worsening symptoms of concern.

## 2022-09-13 NOTE — ED Provider Notes (Signed)
[place] EMERGENCY DEPARTMENT AT [facility] Provider Note   CSN: [id number] Arrival date & time: 09/13/22  4098     History  Chief Complaint  Patient presents with   Tremors    Robyn Anderson is a 57 y.o. female.  HPI Patient presents for intermittent left hand shaking.  Medical history includes HTN, HLD, migraine headaches, neuropathy, CAD.  In the past, she was started on Topamax for migraine prophylaxis.  After she was started on that medication, she did have tremors and medication was discontinued.  Earlier this month, she underwent tendon surgery on her left wrist.  She had her sutures removed last week on Tuesday.  Starting on Thursday, 3 days ago, she started having intermittent episodes of uncontrolled left hand shaking.  She states that she usually notices this when she is holding something.  Episodes last for only a few seconds.  They will sometimes occur when she is not using her left hand.  She had 1 such episode this morning which prompted her to come into the ED.  She has never had a seizure before.  Her son has frequent seizures.  Patient also describes some bilateral leg twitching that occurred when she was getting an MRI of her lower back 3 days ago.  Currently, she is asymptomatic.    Home Medications Prior to Admission medications   Medication Sig Start Date End Date Taking? Authorizing Provider  albuterol (VENTOLIN HFA) 108 (90 Base) MCG/ACT inhaler Inhale into the lungs. 08/16/17   [provider]  albuterol (VENTOLIN HFA) 108 (90 Base) MCG/ACT inhaler Inhale 1-2 puffs into the lungs every 6 (six) hours as needed for wheezing or shortness of breath. 05/23/20   Rhys Martini, PA-C  butalbital-acetaminophen-caffeine (FIORICET) 50-325-40 MG tablet TAKE 1 TABLET BY MOUTH EVERY 4 HOURS FOR UP TO 10 DAYS AS NEEDED FOR PAIN 11/25/21   [provider]  cetirizine (ZYRTEC) 10 MG tablet Take 1 tablet (10 mg total) by mouth daily. 07/06/22 08/05/22   Valentino Nose, NP  Cholecalciferol (VITAMIN D3) 1.25 MG (50000 UT) CAPS Take 1 capsule by mouth once a week. 08/29/19   [provider]  esomeprazole (NEXIUM) 40 MG capsule Take 40 mg by mouth daily at 12 noon.    [provider]  fluticasone (FLONASE) 50 MCG/ACT nasal spray Place 1 spray into both nostrils daily. 07/06/22   Valentino Nose, NP  Hyoscyamine Sulfate SL (LEVSIN/SL) 0.125 MG SUBL Place 0.125 mg under the tongue every 6 (six) hours as needed. 10/03/21   Triplett, Cari B, FNP  lidocaine (XYLOCAINE) 2 % solution Use as directed 15 mLs in the mouth or throat as needed for mouth pain. 05/23/20   Rhys Martini, PA-C  lisinopril (ZESTRIL) 30 MG tablet TAKE 1 TABLET(30 MG) BY MOUTH DAILY 12/15/19   Lennette Bihari, MD  nortriptyline (PAMELOR) 10 MG capsule Take 10 mg by mouth at bedtime. 1 capsule by mouth nightly for one week then increase to 2 capsules nightly    [provider]  ondansetron (ZOFRAN) 4 MG tablet Take 1 tablet (4 mg total) by mouth every 8 (eight) hours as needed for nausea or vomiting. 12/12/19   Darr, Gerilyn Pilgrim, PA-C  pregabalin (LYRICA) 50 MG capsule Take 50 mg by mouth 3 (three) times daily.    [provider]  SUMAtriptan (IMITREX) 25 MG tablet Take 25 mg by mouth every 2 (two) hours as needed for migraine. May repeat in 2 hours if headache  persists or recurs.    [provider]      Allergies    Topiramate, Atorvastatin, Diclofenac, Hydrocodone-acetaminophen, and Vicodin [hydrocodone-acetaminophen]    Review of Systems   Review of Systems  Neurological:  Positive for tremors.  All other systems reviewed and are negative.   Physical Exam Updated Vital Signs BP (!) 153/83   Pulse 68   Temp 98 F (36.7 C) (Oral)   Resp 16   Ht 4\' 9"  (1.448 m)   Wt 61.2 kg   LMP 07/30/2017   SpO2 100%   BMI 29.21 kg/m  Physical Exam Vitals and nursing note reviewed.  Constitutional:      General: She is not in acute  distress.    Appearance: Normal appearance. She is well-developed. She is not ill-appearing, toxic-appearing or diaphoretic.  HENT:     Head: Normocephalic and atraumatic.     Right Ear: External ear normal.     Left Ear: External ear normal.     Nose: Nose normal.     Mouth/Throat:     Mouth: Mucous membranes are moist.  Eyes:     Extraocular Movements: Extraocular movements intact.     Conjunctiva/sclera: Conjunctivae normal.  Cardiovascular:     Rate and Rhythm: Normal rate and regular rhythm.  Pulmonary:     Effort: Pulmonary effort is normal. No respiratory distress.  Abdominal:     General: There is no distension.     Palpations: Abdomen is soft.  Musculoskeletal:        General: No swelling. Normal range of motion.     Cervical back: Normal range of motion and neck supple.     Right lower leg: No edema.     Left lower leg: No edema.  Skin:    General: Skin is warm and dry.     Coloration: Skin is not jaundiced or pale.  Neurological:     General: No focal deficit present.     Mental Status: She is alert and oriented to person, place, and time.     Cranial Nerves: No cranial nerve deficit.     Sensory: No sensory deficit.     Motor: No weakness.     Coordination: Coordination normal.  Psychiatric:        Mood and Affect: Mood normal.        Behavior: Behavior normal.        Thought Content: Thought content normal.        Judgment: Judgment normal.     ED Results / Procedures / Treatments   Labs (all labs ordered are listed, but only abnormal results are displayed) Labs Reviewed  COMPREHENSIVE METABOLIC PANEL - Abnormal; Notable for the following components:      Result Value   Albumin 3.4 (*)    All other components within normal limits  CBC WITH DIFFERENTIAL/PLATELET - Abnormal; Notable for the following components:   Hemoglobin 16.1 (*)    HCT 48.5 (*)    All other components within normal limits  URINALYSIS, ROUTINE W REFLEX MICROSCOPIC - Abnormal;  Notable for the following components:   Color, Urine STRAW (*)    All other components within normal limits  MAGNESIUM    EKG EKG Interpretation  Date/Time:  Sunday Sep 13 2022 09:14:00 EDT Ventricular Rate:  72 PR Interval:  147 QRS Duration: 98 QT Interval:  390 QTC Calculation: 427 R Axis:   19 Text Interpretation: Age not entered, assumed to be  57 years old for purpose  of ECG interpretation Sinus rhythm Low voltage, precordial leads Confirmed by Gloris Manchester [phone]) on 09/13/2022 10:53:25 AM  Radiology MR Brain W and Wo Contrast  Result Date: 09/13/2022 CLINICAL DATA:  TIA EXAM: MRI HEAD WITHOUT AND WITH CONTRAST TECHNIQUE: Multiplanar, multiecho pulse sequences of the brain and surrounding structures were obtained without and with intravenous contrast. CONTRAST:  6mL GADAVIST GADOBUTROL 1 MMOL/ML IV SOLN COMPARISON:  10/11/2018 FINDINGS: Brain: No acute infarction, hemorrhage, hydrocephalus, extra-axial collection or mass lesion. No abnormal enhancement. Minor FLAIR hyperintensity in the periventricular white matter. Brain volume is normal. Vascular: Normal flow voids. Skull and upper cervical spine: Normal marrow signal. Sinuses/Orbits: Negative. IMPRESSION: No acute finding or change from 2020. Electronically Signed   By: Tiburcio Pea M.D.   On: 09/13/2022 12:26    Procedures Procedures    Medications Ordered in ED Medications  gadobutrol (GADAVIST) 1 MMOL/ML injection 6 mL (6 mLs Intravenous Contrast Given 09/13/22 1115)  metoCLOPramide (REGLAN) injection 10 mg (10 mg Intravenous Given 09/13/22 1234)  diphenhydrAMINE (BENADRYL) injection 12.5 mg (12.5 mg Intravenous Given 09/13/22 1234)  lactated ringers bolus 500 mL (0 mLs Intravenous Stopped 09/13/22 1301)    ED Course/ Medical Decision Making/ A&P                             Medical Decision Making Amount and/or Complexity of Data Reviewed Labs: ordered. Radiology: ordered.  Risk Prescription drug  management.   This patient presents to the ED for concern of tremors, this involves an extensive number of treatment options, and is a complaint that carries with it a high risk of complications and morbidity.  The differential diagnosis includes partial focal seizure, intention tremor, muscle spasms, parkinsonism, anxiety   Co morbidities that complicate the patient evaluation  HTN, HLD, migraine headaches, neuropathy, CAD   Additional history obtained:  Additional history obtained from N/A External records from outside source obtained and reviewed including EMR   Lab Tests:  I Ordered, and personally interpreted labs.  The pertinent results include: Normal hemoglobin, no leukocytosis, normal electrolytes, no UTI   Imaging Studies ordered:  I ordered imaging studies including MRI brain I independently visualized and interpreted imaging which showed no acute findings I agree with the radiologist interpretation   Cardiac Monitoring: / EKG:  The patient was maintained on a cardiac monitor.  I personally viewed and interpreted the cardiac monitored which showed an underlying rhythm of: Sinus rhythm  Problem List / ED Course / Critical interventions / Medication management  Patient presents for intermittent episodes of left hand and bilateral leg tremors.  These have occurred over the past couple days.  She denies any history of tremors.  She denies any history of seizures.  Patient is well-appearing on arrival.  She has no focal neurologic deficits on exam, including any evidence of tremor.  Patient underwent lab work and MRI of brain.  Results are reassuring.  While in the ED, she did have a headache and was treated with Reglan and Benadryl.  She had resolution of headache.  She had no episodes of tremoring while in the ED.  She does currently follow with a neurologist.  She is stable for discharge with outpatient neurology follow-up. I ordered medication including IVF, Benadryl,  Reglan for headache Reevaluation of the patient after these medicines showed that the patient resolved I have reviewed the patients home medicines and have made adjustments as needed   Social Determinants of Health:  Has access to outpatient care        Final Clinical Impression(s) / ED Diagnoses Final diagnoses:  Tremor    Rx / DC Orders ED Discharge Orders     None         Gloris Manchester, MD 09/13/22 1343

## 2022-09-13 NOTE — ED Triage Notes (Signed)
Pt states that she has had shaking in her left hand since Thursday. Pt states she had twitching in her legs during her MRI. Pt states she was told by PCP to come in. Pt had surgery on that arm on 5/6.  Pt adds that she fell Friday without injury as well.

## 2022-09-13 NOTE — ED Notes (Signed)
Patient taken to MRI via transport.

## 2022-09-16 ENCOUNTER — Ambulatory Visit: Payer: Managed Care, Other (non HMO) | Admitting: Physical Therapy

## 2022-09-16 DIAGNOSIS — M542 Cervicalgia: Secondary | ICD-10-CM | POA: Diagnosis not present

## 2022-09-16 DIAGNOSIS — R293 Abnormal posture: Secondary | ICD-10-CM

## 2022-09-16 DIAGNOSIS — M5459 Other low back pain: Secondary | ICD-10-CM

## 2022-09-16 DIAGNOSIS — M5412 Radiculopathy, cervical region: Secondary | ICD-10-CM

## 2022-09-16 DIAGNOSIS — M6281 Muscle weakness (generalized): Secondary | ICD-10-CM
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
## 2022-09-18 ENCOUNTER — Ambulatory Visit: Payer: Managed Care, Other (non HMO) | Admitting: Physical Therapy

## 2022-09-18 DIAGNOSIS — R293 Abnormal posture: Secondary | ICD-10-CM

## 2022-09-18 DIAGNOSIS — M542 Cervicalgia: Secondary | ICD-10-CM

## 2022-09-18 DIAGNOSIS — M6281 Muscle weakness (generalized): Secondary | ICD-10-CM

## 2022-09-18 DIAGNOSIS — M5459 Other low back pain: Secondary | ICD-10-CM

## 2022-09-18 DIAGNOSIS — M5412 Radiculopathy, cervical region: Secondary | ICD-10-CM
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
## 2022-09-23 ENCOUNTER — Ambulatory Visit: Payer: Managed Care, Other (non HMO) | Admitting: Physical Therapy

## 2022-09-25 ENCOUNTER — Ambulatory Visit: Payer: Managed Care, Other (non HMO) | Attending: Physical Medicine & Rehabilitation | Admitting: Physical Therapy

## 2022-09-25 ENCOUNTER — Encounter: Payer: Self-pay | Admitting: Physical Therapy

## 2022-09-25 DIAGNOSIS — M5459 Other low back pain: Secondary | ICD-10-CM | POA: Diagnosis present

## 2022-09-25 DIAGNOSIS — M6281 Muscle weakness (generalized): Secondary | ICD-10-CM | POA: Insufficient documentation

## 2022-09-25 DIAGNOSIS — R293 Abnormal posture: Secondary | ICD-10-CM | POA: Diagnosis present

## 2022-09-25 DIAGNOSIS — M5412 Radiculopathy, cervical region: Secondary | ICD-10-CM | POA: Insufficient documentation

## 2022-09-25 DIAGNOSIS — M542 Cervicalgia: Secondary | ICD-10-CM | POA: Insufficient documentation
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
## 2022-09-30 ENCOUNTER — Ambulatory Visit: Payer: Managed Care, Other (non HMO)

## 2022-09-30 DIAGNOSIS — R293 Abnormal posture: Secondary | ICD-10-CM

## 2022-09-30 DIAGNOSIS — M5412 Radiculopathy, cervical region: Secondary | ICD-10-CM

## 2022-09-30 DIAGNOSIS — M5459 Other low back pain: Secondary | ICD-10-CM

## 2022-09-30 DIAGNOSIS — M6281 Muscle weakness (generalized): Secondary | ICD-10-CM

## 2022-09-30 DIAGNOSIS — M542 Cervicalgia: Secondary | ICD-10-CM
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
## 2022-10-02 ENCOUNTER — Ambulatory Visit: Payer: Managed Care, Other (non HMO) | Admitting: Physical Therapy

## 2022-10-02 DIAGNOSIS — M542 Cervicalgia: Secondary | ICD-10-CM | POA: Diagnosis not present

## 2022-10-02 DIAGNOSIS — R293 Abnormal posture: Secondary | ICD-10-CM

## 2022-10-02 DIAGNOSIS — M6281 Muscle weakness (generalized): Secondary | ICD-10-CM

## 2022-10-02 DIAGNOSIS — M5459 Other low back pain: Secondary | ICD-10-CM

## 2022-10-02 DIAGNOSIS — M5412 Radiculopathy, cervical region: Secondary | ICD-10-CM
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
## 2022-10-07 ENCOUNTER — Ambulatory Visit: Payer: Managed Care, Other (non HMO) | Admitting: Physical Therapy

## 2022-10-09 ENCOUNTER — Ambulatory Visit: Payer: Managed Care, Other (non HMO) | Admitting: Physical Therapy

## 2022-10-14 ENCOUNTER — Ambulatory Visit: Payer: Managed Care, Other (non HMO) | Admitting: Physical Therapy

## 2022-10-16 ENCOUNTER — Ambulatory Visit: Payer: Managed Care, Other (non HMO) | Admitting: Physical Therapy

## 2022-10-16 ENCOUNTER — Ambulatory Visit: Payer: Managed Care, Other (non HMO)

## 2022-10-19 ENCOUNTER — Ambulatory Visit: Payer: Managed Care, Other (non HMO) | Attending: Physical Medicine & Rehabilitation | Admitting: Physical Therapy

## 2022-10-19 DIAGNOSIS — M542 Cervicalgia: Secondary | ICD-10-CM

## 2022-10-19 DIAGNOSIS — M6281 Muscle weakness (generalized): Secondary | ICD-10-CM | POA: Diagnosis present

## 2022-10-19 DIAGNOSIS — M5412 Radiculopathy, cervical region: Secondary | ICD-10-CM

## 2022-10-19 DIAGNOSIS — R293 Abnormal posture: Secondary | ICD-10-CM

## 2022-10-19 DIAGNOSIS — M5459 Other low back pain: Secondary | ICD-10-CM
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
## 2022-10-21 ENCOUNTER — Ambulatory Visit: Payer: Managed Care, Other (non HMO) | Admitting: Physical Therapy

## 2022-10-23 ENCOUNTER — Ambulatory Visit: Payer: Managed Care, Other (non HMO) | Admitting: Physical Therapy

## 2022-10-26 ENCOUNTER — Ambulatory Visit: Payer: Managed Care, Other (non HMO) | Admitting: Physical Therapy

## 2022-10-28 ENCOUNTER — Encounter: Payer: Managed Care, Other (non HMO) | Admitting: Physical Therapy

## 2022-10-28 ENCOUNTER — Ambulatory Visit: Payer: Managed Care, Other (non HMO) | Admitting: Physical Therapy

## 2022-10-30 ENCOUNTER — Ambulatory Visit: Payer: Managed Care, Other (non HMO) | Admitting: Physical Therapy

## 2022-11-04 ENCOUNTER — Ambulatory Visit: Payer: Managed Care, Other (non HMO)

## 2022-11-04 ENCOUNTER — Encounter: Payer: Managed Care, Other (non HMO) | Admitting: Physical Therapy

## 2022-11-06 ENCOUNTER — Encounter: Payer: Managed Care, Other (non HMO) | Admitting: Physical Therapy

## 2022-11-13 ENCOUNTER — Encounter: Payer: Managed Care, Other (non HMO) | Admitting: Physical Therapy

## 2022-11-20 ENCOUNTER — Encounter: Payer: Managed Care, Other (non HMO) | Admitting: Physical Therapy

## 2022-12-02 ENCOUNTER — Encounter: Payer: Managed Care, Other (non HMO) | Admitting: Physical Therapy

## 2022-12-28 ENCOUNTER — Encounter: Payer: Managed Care, Other (non HMO) | Admitting: Physical Therapy

## 2022-12-30 ENCOUNTER — Encounter: Payer: Managed Care, Other (non HMO) | Admitting: Physical Therapy

## 2023-01-04 ENCOUNTER — Encounter: Payer: Managed Care, Other (non HMO) | Admitting: Physical Therapy

## 2023-01-06 ENCOUNTER — Other Ambulatory Visit: Payer: Self-pay

## 2023-01-06 ENCOUNTER — Ambulatory Visit
Admission: EM | Admit: 2023-01-06 | Discharge: 2023-01-06 | Disposition: A | Discharge status: Home / Self Care | Payer: Managed Care, Other (non HMO) | Attending: Nurse Practitioner | Admitting: Nurse Practitioner

## 2023-01-06 ENCOUNTER — Encounter: Payer: Managed Care, Other (non HMO) | Admitting: Physical Therapy

## 2023-01-06 DIAGNOSIS — G43911 Migraine, unspecified, intractable, with status migrainosus: Secondary | ICD-10-CM | POA: Diagnosis not present

## 2023-01-06 MED ORDER — ONDANSETRON 4 MG PO TBDP
4.0000 mg | ORAL_TABLET | Freq: Once | ORAL | Status: AC
  Administered 2023-01-06: 4 mg via ORAL

## 2023-01-06 MED ORDER — KETOROLAC TROMETHAMINE 30 MG/ML IJ SOLN
30.0000 mg | Freq: Once | INTRAMUSCULAR | Status: AC
  Administered 2023-01-06: 30 mg via INTRAMUSCULAR

## 2023-01-06 MED ORDER — DEXAMETHASONE SODIUM PHOSPHATE 10 MG/ML IJ SOLN
10.0000 mg | Freq: Once | INTRAMUSCULAR | Status: AC
  Administered 2023-01-06: 10 mg via INTRAMUSCULAR

## 2023-01-06 MED ORDER — SUMATRIPTAN SUCCINATE 6 MG/0.5ML ~~LOC~~ SOLN
6.0000 mg | Freq: Once | SUBCUTANEOUS | Status: AC
  Administered 2023-01-06: 6 mg via SUBCUTANEOUS

## 2023-01-06 MED ORDER — ONDANSETRON 4 MG PO TBDP: 4.0000 mg | ORAL_TABLET | Freq: Three times a day (TID) | ORAL | 0 refills | Status: AC | PRN

## 2023-01-06 NOTE — Discharge Instructions (Addendum)
You were given injections of Decadron 10 mg, Toradol 30 mg, and sumatriptan 6 mg. Take medication as prescribed. May take over-the-counter Tylenol arthritis strength 650 mg tablet every 8 hours for breakthrough pain. Increase fluids and allow for plenty of rest. Recommend sleeping in a dimly lit room, and use of cool cloths across her forehead while symptoms persist. Go to the emergency department immediately if you experience visual changes, worsening headache pain, lightheadedness, weakness, or other concerns. Follow-up with neurology as scheduled. Follow-up as needed.

## 2023-01-06 NOTE — ED Provider Notes (Signed)
[facility]    CSN: [id number] Arrival date & time: 01/06/23  1827      History   Chief Complaint Chief Complaint  Patient presents with   Headache    HPI Robyn Anderson is a 57 y.o. female.   The history is provided by the patient.   Patient presents for complaints of a migraine headache that started over the past 2 days.  Patient states that the headache started on the right side of her head, states that it is moved to the left side of the head and to the rear of her head over the past 2 days.  She also complains of nausea, light sensitivity, and sound sensitivity.  She denies blurred vision, lightheadedness, dizziness, chest pain, abdominal pain, vomiting, or diarrhea.  Patient reports that she has taken her Fioricet with no relief.  Patient states that she has been unable to pick up her Emgality from the pharmacy because it has been out of stock.  Patient states she is scheduled to see her neurologist this week.  Past Medical History:  Diagnosis Date   Barrett esophagus    Coronary artery disease    GERD (gastroesophageal reflux disease)    Headache    migraines 1-2/mo   Hypertension    Obesity (BMI 30-39.9)    Postmenopausal bleeding 01/20/2019    Patient Active Problem List   Diagnosis Date Noted   Migraine with aura and without status migrainosus, not intractable 04/06/2019   Neuropathy 04/06/2019   Cervical stenosis (uterine cervix) 03/31/2019   Essential hypertension 04/22/2018   Hypertriglyceridemia 04/22/2018   History of chest pain 04/22/2018   Tobacco abuse 04/22/2018    Past Surgical History:  Procedure Laterality Date   ATHERECTOMY Right    renal   CARPAL TUNNEL RELEASE Left    CARPAL TUNNEL RELEASE Right 09/27/2015   Procedure: OPEN CARPAL TUNNEL RELEASE RIGHT HAND;  Surgeon: Erin Sons, MD;  Location: [facility];  Service: Orthopedics;  Laterality: Right;   CESAREAN SECTION     x2   COLONOSCOPY      ESOPHAGOGASTRODUODENOSCOPY     HYSTEROSCOPY WITH D & C N/A 04/18/2019   Procedure: DILATATION AND CURETTAGE /HYSTEROSCOPY;  Surgeon: Boulder Bing, MD;  Location: [facility];  Service: Gynecology;  Laterality: N/A;   LAPAROSCOPIC SALPINGO OOPHERECTOMY Bilateral 04/18/2019   Procedure: LAPAROSCOPIC SALPINGO OOPHORECTOMY;  Surgeon: Eldorado Bing, MD;  Location: [facility];  Service: Gynecology;  Laterality: Bilateral;   TUBAL LIGATION      OB History     Gravida  2   Para  2   Term      Preterm      AB      Living  2      SAB      IAB      Ectopic      Multiple      Live Births  2            Home Medications    Prior to Admission medications   Medication Sig Start Date End Date Taking? Authorizing Provider  ondansetron (ZOFRAN-ODT) 4 MG disintegrating tablet Take 1 tablet (4 mg total) by mouth every 8 (eight) hours as needed. 01/06/23  Yes Hargis Vandyne-Warren, Sadie Haber, NP  albuterol (VENTOLIN HFA) 108 (90 Base) MCG/ACT inhaler Inhale into the lungs. 08/16/17   [provider]  albuterol (VENTOLIN HFA) 108 (90 Base) MCG/ACT inhaler Inhale 1-2 puffs into the lungs every 6 (  six) hours as needed for wheezing or shortness of breath. 05/23/20   Rhys Martini, PA-C  butalbital-acetaminophen-caffeine (FIORICET) 50-325-40 MG tablet TAKE 1 TABLET BY MOUTH EVERY 4 HOURS FOR UP TO 10 DAYS AS NEEDED FOR PAIN 11/25/21   [provider]  cetirizine (ZYRTEC) 10 MG tablet Take 1 tablet (10 mg total) by mouth daily. 07/06/22 08/05/22  Valentino Nose, NP  Cholecalciferol (VITAMIN D3) 1.25 MG (50000 UT) CAPS Take 1 capsule by mouth once a week. 08/29/19   [provider]  esomeprazole (NEXIUM) 40 MG capsule Take 40 mg by mouth daily at 12 noon.    [provider]  fluticasone (FLONASE) 50 MCG/ACT nasal spray Place 1 spray into both nostrils daily. 07/06/22   Valentino Nose, NP  Hyoscyamine Sulfate SL (LEVSIN/SL) 0.125  MG SUBL Place 0.125 mg under the tongue every 6 (six) hours as needed. 10/03/21   Triplett, Cari B, FNP  lidocaine (XYLOCAINE) 2 % solution Use as directed 15 mLs in the mouth or throat as needed for mouth pain. 05/23/20   Rhys Martini, PA-C  lisinopril (ZESTRIL) 30 MG tablet TAKE 1 TABLET(30 MG) BY MOUTH DAILY 12/15/19   Lennette Bihari, MD  nortriptyline (PAMELOR) 10 MG capsule Take 10 mg by mouth at bedtime. 1 capsule by mouth nightly for one week then increase to 2 capsules nightly    [provider]  ondansetron (ZOFRAN) 4 MG tablet Take 1 tablet (4 mg total) by mouth every 8 (eight) hours as needed for nausea or vomiting. 12/12/19   Darr, Gerilyn Pilgrim, PA-C  pregabalin (LYRICA) 50 MG capsule Take 50 mg by mouth 3 (three) times daily.    [provider]  SUMAtriptan (IMITREX) 25 MG tablet Take 25 mg by mouth every 2 (two) hours as needed for migraine. May repeat in 2 hours if headache persists or recurs.    [provider]    Family History Family History  Problem Relation Age of Onset   Cancer Mother    Hypertension Mother    Cancer Father    Hypertension Father    Hypertension Sister    Hypertension Brother    Stroke Maternal Grandmother    Diabetes Paternal Grandmother    Epilepsy Son     Social History Social History   Tobacco Use   Smoking status: Every Day    Current packs/day: 0.50    Average packs/day: 0.5 packs/day for 30.0 years (15.0 ttl pk-yrs)    Types: Cigarettes   Smokeless tobacco: Never  Substance Use Topics   Alcohol use: No   Drug use: No     Allergies   Topiramate, Atorvastatin, Diclofenac, Hydrocodone-acetaminophen, and Vicodin [hydrocodone-acetaminophen]   Review of Systems Review of Systems Per HPI  Physical Exam Triage Vital Signs ED Triage Vitals [01/06/23 1845]  Encounter Vitals Group     BP 126/84     Systolic BP Percentile      Diastolic BP Percentile      Pulse Rate 96     Resp 16     Temp 98.4 F (36.9 C)      Temp Source Oral     SpO2 98 %     Weight      Height      Head Circumference      Peak Flow      Pain Score 9     Pain Loc      Pain Education      Exclude from [employer]  Chart    No data found.  Updated Vital Signs BP 126/84 (BP Location: Right Arm)   Pulse 96   Temp 98.4 F (36.9 C) (Oral)   Resp 16   LMP 07/30/2017   SpO2 98%   Visual Acuity Right Eye Distance:   Left Eye Distance:   Bilateral Distance:    Right Eye Near:   Left Eye Near:    Bilateral Near:     Physical Exam Vitals and nursing note reviewed.  Constitutional:      General: She is not in acute distress.    Appearance: She is well-developed.  HENT:     Head: Normocephalic.     Right Ear: Tympanic membrane, ear canal and external ear normal.     Left Ear: Tympanic membrane, ear canal and external ear normal.     Nose: Nose normal.     Mouth/Throat:     Mouth: Mucous membranes are moist.  Eyes:     General: No visual field deficit.    Extraocular Movements: Extraocular movements intact.     Pupils: Pupils are equal, round, and reactive to light.  Cardiovascular:     Rate and Rhythm: Regular rhythm.     Heart sounds: Normal heart sounds.  Pulmonary:     Effort: Pulmonary effort is normal. No respiratory distress.     Breath sounds: Normal breath sounds. No stridor. No wheezing, rhonchi or rales.  Abdominal:     General: Bowel sounds are normal.     Palpations: Abdomen is soft.     Tenderness: There is no abdominal tenderness.  Musculoskeletal:     Cervical back: Normal range of motion.  Lymphadenopathy:     Cervical: No cervical adenopathy.  Skin:    General: Skin is warm and dry.  Neurological:     General: No focal deficit present.     Mental Status: She is alert and oriented to person, place, and time.     GCS: GCS eye subscore is 4. GCS verbal subscore is 5. GCS motor subscore is 6.     Cranial Nerves: No cranial nerve deficit or facial asymmetry.     Sensory: No sensory deficit.      Motor: No weakness.     Coordination: Coordination normal.     Gait: Gait normal.  Psychiatric:        Mood and Affect: Mood normal.        Speech: Speech normal.        Behavior: Behavior normal.      UC Treatments / Results  Labs (all labs ordered are listed, but only abnormal results are displayed) Labs Reviewed - No data to display  EKG   Radiology No results found.  Procedures Procedures (including critical care time)  Medications Ordered in UC Medications  ketorolac (TORADOL) 30 MG/ML injection 30 mg (has no administration in time range)  dexamethasone (DECADRON) injection 10 mg (has no administration in time range)  SUMAtriptan (IMITREX) injection 6 mg (has no administration in time range)  ondansetron (ZOFRAN-ODT) disintegrating tablet 4 mg (has no administration in time range)    Initial Impression / Assessment and Plan / UC Course  I have reviewed the triage vital signs and the nursing notes.  Pertinent labs & imaging results that were available during my care of the patient were reviewed by me and considered in my medical decision making (see chart for details).  The patient is well-appearing, she is in no acute distress, vital signs are stable.  Symptoms consistent with a migraine headache.  Patient was administered Decadron 10 mg IM, Toradol 30 mg IM, and sumatriptan 6 mg subcutaneously.  Patient was also given Zofran 4 mg ODT for nausea.  Supportive care recommendations were provided and discussed with the patient to include increasing her fluids, allowing for plenty of rest, and over-the-counter ibuprofen for pain or discomfort.  Patient was given strict ER follow-up precautions.  Patient is in agreement with this plan of care and verbalizes understanding.  All questions were answered.  Patient stable for discharge.  Final Clinical Impressions(s) / UC Diagnoses   Final diagnoses:  Intractable migraine with status migrainosus, unspecified migraine type      Discharge Instructions      You were given injections of Decadron 10 mg, Toradol 30 mg, and sumatriptan 6 mg. Take medication as prescribed. May take over-the-counter Tylenol arthritis strength 650 mg tablet every 8 hours for breakthrough pain. Increase fluids and allow for plenty of rest. Recommend sleeping in a dimly lit room, and use of cool cloths across her forehead while symptoms persist. Go to the emergency department immediately if you experience visual changes, worsening headache pain, lightheadedness, weakness, or other concerns. Follow-up with neurology as scheduled. Follow-up as needed.     ED Prescriptions     Medication Sig Dispense Auth. Provider   ondansetron (ZOFRAN-ODT) 4 MG disintegrating tablet Take 1 tablet (4 mg total) by mouth every 8 (eight) hours as needed. 20 tablet Tirzah Fross-Warren, Sadie Haber, NP      PDMP not reviewed this encounter.   Abran Cantor, NP 01/06/23 2006

## 2023-01-06 NOTE — ED Triage Notes (Signed)
Pt states headache and nausea since yesterday.  States she has a history of headaches. States she took her Fioricet at home with no relief.

## 2023-01-07 ENCOUNTER — Encounter (HOSPITAL_COMMUNITY): Payer: Self-pay | Admitting: Emergency Medicine

## 2023-01-07 ENCOUNTER — Emergency Department (HOSPITAL_COMMUNITY)
Admission: EM | Admit: 2023-01-07 | Discharge: 2023-01-07 | Disposition: A | Discharge status: Home / Self Care | Payer: Managed Care, Other (non HMO) | Attending: Student | Admitting: Student

## 2023-01-07 ENCOUNTER — Ambulatory Visit: Payer: Managed Care, Other (non HMO)

## 2023-01-07 ENCOUNTER — Emergency Department (HOSPITAL_COMMUNITY): Payer: Managed Care, Other (non HMO)

## 2023-01-07 DIAGNOSIS — Z79899 Other long term (current) drug therapy: Secondary | ICD-10-CM | POA: Insufficient documentation

## 2023-01-07 DIAGNOSIS — Z1152 Encounter for screening for COVID-19: Secondary | ICD-10-CM | POA: Diagnosis not present

## 2023-01-07 DIAGNOSIS — I10 Essential (primary) hypertension: Secondary | ICD-10-CM | POA: Diagnosis not present

## 2023-01-07 DIAGNOSIS — F17210 Nicotine dependence, cigarettes, uncomplicated: Secondary | ICD-10-CM | POA: Insufficient documentation

## 2023-01-07 DIAGNOSIS — I2510 Atherosclerotic heart disease of native coronary artery without angina pectoris: Secondary | ICD-10-CM | POA: Diagnosis not present

## 2023-01-07 DIAGNOSIS — R519 Headache, unspecified: Secondary | ICD-10-CM | POA: Diagnosis present

## 2023-01-07 DIAGNOSIS — G43819 Other migraine, intractable, without status migrainosus: Secondary | ICD-10-CM

## 2023-01-07 LAB — CBC WITH DIFFERENTIAL/PLATELET
Abs Immature Granulocytes: 0.11 10*3/uL — ABNORMAL HIGH (ref 0.00–0.07)
Basophils Absolute: 0 10*3/uL (ref 0.0–0.1)
Basophils Relative: 0 %
Eosinophils Absolute: 0 10*3/uL (ref 0.0–0.5)
Eosinophils Relative: 0 %
HCT: 44.8 % (ref 36.0–46.0)
Hemoglobin: 15.3 g/dL — ABNORMAL HIGH (ref 12.0–15.0)
Immature Granulocytes: 1 %
Lymphocytes Relative: 20 %
Lymphs Abs: 2 10*3/uL (ref 0.7–4.0)
MCH: 32.3 pg (ref 26.0–34.0)
MCHC: 34.2 g/dL (ref 30.0–36.0)
MCV: 94.5 fL (ref 80.0–100.0)
Monocytes Absolute: 0.2 10*3/uL (ref 0.1–1.0)
Monocytes Relative: 2 %
Neutro Abs: 7.8 10*3/uL — ABNORMAL HIGH (ref 1.7–7.7)
Neutrophils Relative %: 77 %
Platelets: 296 10*3/uL (ref 150–400)
RBC: 4.74 MIL/uL (ref 3.87–5.11)
RDW: 12.9 % (ref 11.5–15.5)
WBC: 10.1 10*3/uL (ref 4.0–10.5)
nRBC: 0 % (ref 0.0–0.2)

## 2023-01-07 LAB — COMPREHENSIVE METABOLIC PANEL
ALT: 21 U/L (ref 0–44)
AST: 25 U/L (ref 15–41)
Albumin: 3.3 g/dL — ABNORMAL LOW (ref 3.5–5.0)
Alkaline Phosphatase: 95 U/L (ref 38–126)
Anion gap: 15 (ref 5–15)
BUN: 19 mg/dL (ref 6–20)
CO2: 18 mmol/L — ABNORMAL LOW (ref 22–32)
Calcium: 9.8 mg/dL (ref 8.9–10.3)
Chloride: 105 mmol/L (ref 98–111)
Creatinine, Ser: 0.96 mg/dL (ref 0.44–1.00)
GFR, Estimated: >60 mL/min (ref >60)
Glucose, Bld: 187 mg/dL — ABNORMAL HIGH (ref 70–99)
Potassium: 4.5 mmol/L (ref 3.5–5.1)
Sodium: 138 mmol/L (ref 135–145)
Total Bilirubin: 0.5 mg/dL (ref 0.3–1.2)
Total Protein: 6.6 g/dL (ref 6.5–8.1)

## 2023-01-07 LAB — TROPONIN I (HIGH SENSITIVITY)
Troponin I (High Sensitivity): 4 ng/L (ref <18)
Troponin I (High Sensitivity): 4 ng/L (ref <18)

## 2023-01-07 LAB — BRAIN NATRIURETIC PEPTIDE: B Natriuretic Peptide: 38.6 pg/mL (ref 0.0–100.0)

## 2023-01-07 LAB — D-DIMER, QUANTITATIVE: D-Dimer, Quant: 1.26 ug/mL-FEU — ABNORMAL HIGH (ref 0.00–0.50)

## 2023-01-07 LAB — SARS CORONAVIRUS 2 BY RT PCR: SARS Coronavirus 2 by RT PCR: NEGATIVE

## 2023-01-07 MED ORDER — PROCHLORPERAZINE EDISYLATE 10 MG/2ML IJ SOLN
10.0000 mg | Freq: Once | INTRAMUSCULAR | Status: AC
  Administered 2023-01-07: 10 mg via INTRAVENOUS
  Filled 2023-01-07: qty 2

## 2023-01-07 MED ORDER — LACTATED RINGERS IV BOLUS
1000.0000 mL | Freq: Once | INTRAVENOUS | Status: AC
  Administered 2023-01-07: 1000 mL via INTRAVENOUS

## 2023-01-07 MED ORDER — IOHEXOL 350 MG/ML SOLN
75.0000 mL | Freq: Once | INTRAVENOUS | Status: AC | PRN
  Administered 2023-01-07: 75 mL via INTRAVENOUS

## 2023-01-07 MED ORDER — DIPHENHYDRAMINE HCL 50 MG/ML IJ SOLN
25.0000 mg | Freq: Once | INTRAMUSCULAR | Status: AC
  Administered 2023-01-07: 25 mg via INTRAVENOUS
  Filled 2023-01-07: qty 1

## 2023-01-07 NOTE — ED Provider Notes (Signed)
[place] EM[facility]ENCY DEPARTMENT AT [facility] Provider Note  CSN: [id number] Arrival date & time: 01/07/23 1478  Chief Complaint(s) Migraine, Chest Pain, and Shortness of Breath  HPI Robyn Anderson is a 57 y.o. female with PMH Barrett's esophagus, GERD, HTN, HLD, migraines on Fioricet who presents emergency department for evaluation of headache, chest pain and shortness of breath.  Patient was seen in urgent care yesterday for a migraine and was given Toradol, Decadron and sumatriptan.  She endorses some transient improvement of her headache but shortly after leaving urgent care started to develop chest pain and shortness of breath which have persisted overnight.  She endorses a heavy sensation over the chest and shortness of breath but denies any exertional component to the pain.  Denies diaphoresis, vomiting.  Denies numbness, tingling, weakness or other neurologic complaints.   Past Medical History Past Medical History:  Diagnosis Date   Barrett esophagus    Coronary artery disease    GERD (gastroesophageal reflux disease)    Headache    migraines 1-2/mo   Hypertension    Obesity (BMI 30-39.9)    Postmenopausal bleeding 01/20/2019   Patient Active Problem List   Diagnosis Date Noted   Migraine with aura and without status migrainosus, not intractable 04/06/2019   Neuropathy 04/06/2019   Cervical stenosis (uterine cervix) 03/31/2019   Essential hypertension 04/22/2018   Hypertriglyceridemia 04/22/2018   History of chest pain 04/22/2018   Tobacco abuse 04/22/2018   Home Medication(s) Prior to Admission medications   Medication Sig Start Date End Date Taking? Authorizing Provider  albuterol (VENTOLIN HFA) 108 (90 Base) MCG/ACT inhaler Inhale into the lungs. 08/16/17   [provider]  albuterol (VENTOLIN HFA) 108 (90 Base) MCG/ACT inhaler Inhale 1-2 puffs into the lungs every 6 (six) hours as needed for wheezing or shortness of breath. 05/23/20   Rhys Martini,  PA-C  butalbital-acetaminophen-caffeine (FIORICET) 50-325-40 MG tablet TAKE 1 TABLET BY MOUTH EVERY 4 HOURS FOR UP TO 10 DAYS AS NEEDED FOR PAIN 11/25/21   [provider]  cetirizine (ZYRTEC) 10 MG tablet Take 1 tablet (10 mg total) by mouth daily. 07/06/22 08/05/22  Valentino Nose, NP  Cholecalciferol (VITAMIN D3) 1.25 MG (50000 UT) CAPS Take 1 capsule by mouth once a week. 08/29/19   [provider]  esomeprazole (NEXIUM) 40 MG capsule Take 40 mg by mouth daily at 12 noon.    [provider]  fluticasone (FLONASE) 50 MCG/ACT nasal spray Place 1 spray into both nostrils daily. 07/06/22   Valentino Nose, NP  Hyoscyamine Sulfate SL (LEVSIN/SL) 0.125 MG SUBL Place 0.125 mg under the tongue every 6 (six) hours as needed. 10/03/21   Triplett, Cari B, FNP  lidocaine (XYLOCAINE) 2 % solution Use as directed 15 mLs in the mouth or throat as needed for mouth pain. 05/23/20   Rhys Martini, PA-C  lisinopril (ZESTRIL) 30 MG tablet TAKE 1 TABLET(30 MG) BY MOUTH DAILY 12/15/19   Lennette Bihari, MD  nortriptyline (PAMELOR) 10 MG capsule Take 10 mg by mouth at bedtime. 1 capsule by mouth nightly for one week then increase to 2 capsules nightly    [provider]  ondansetron (ZOFRAN) 4 MG tablet Take 1 tablet (4 mg total) by mouth every 8 (eight) hours as needed for nausea or vomiting. 12/12/19   Darr, Gerilyn Pilgrim, PA-C  ondansetron (ZOFRAN-ODT) 4 MG disintegrating tablet Take 1 tablet (4 mg total) by mouth every 8 (eight) hours as needed. 01/06/23  Leath-Warren, Sadie Haber, NP  pregabalin (LYRICA) 50 MG capsule Take 50 mg by mouth 3 (three) times daily.    [provider]  SUMAtriptan (IMITREX) 25 MG tablet Take 25 mg by mouth every 2 (two) hours as needed for migraine. May repeat in 2 hours if headache persists or recurs.    [provider]                                                                                                                                     Past Surgical History Past Surgical History:  Procedure Laterality Date   ATHERECTOMY Right    renal   CARPAL TUNNEL RELEASE Left    CARPAL TUNNEL RELEASE Right 09/27/2015   Procedure: OPEN CARPAL TUNNEL RELEASE RIGHT HAND;  Surgeon: Erin Sons, MD;  Location: [facility];  Service: Orthopedics;  Laterality: Right;   CESAREAN SECTION     x2   COLONOSCOPY     ESOPHAGOGASTRODUODENOSCOPY     HYSTEROSCOPY WITH D & C N/A 04/18/2019   Procedure: DILATATION AND CURETTAGE /HYSTEROSCOPY;  Surgeon: [place] Bing, MD;  Location: [facility];  Service: Gynecology;  Laterality: N/A;   LAPAROSCOPIC SALPINGO OOPHERECTOMY Bilateral 04/18/2019   Procedure: LAPAROSCOPIC SALPINGO OOPHORECTOMY;  Surgeon: Hermosa Bing, MD;  Location: [facility];  Service: Gynecology;  Laterality: Bilateral;   TUBAL LIGATION     Family History Family History  Problem Relation Age of Onset   Cancer Mother    Hypertension Mother    Cancer Father    Hypertension Father    Hypertension Sister    Hypertension Brother    Stroke Maternal Grandmother    Diabetes Paternal Grandmother    Epilepsy Son     Social History Social History   Tobacco Use   Smoking status: Every Day    Current packs/day: 0.50    Average packs/day: 0.5 packs/day for 30.0 years (15.0 ttl pk-yrs)    Types: Cigarettes   Smokeless tobacco: Never  Substance Use Topics   Alcohol use: No   Drug use: No   Allergies Topiramate, Atorvastatin, Diclofenac, Hydrocodone-acetaminophen, and Vicodin [hydrocodone-acetaminophen]  Review of Systems Review of Systems  Respiratory:  Positive for shortness of breath.   Cardiovascular:  Positive for chest pain.  Neurological:  Positive for headaches.    Physical Exam Vital Signs  I have reviewed the triage vital signs BP (!) 145/77 (BP Location: Right Arm)   Pulse 75   Temp (!) 97.4 F (36.3 C) (Oral)   Resp 18   Wt 63 kg   LMP 07/30/2017   SpO2  97%   BMI 30.06 kg/m   Physical Exam Vitals and nursing note reviewed.  Constitutional:      General: She is not in acute distress.    Appearance: She is well-developed.  HENT:     Head: Normocephalic and atraumatic.  Eyes:     Conjunctiva/sclera:  Conjunctivae normal.  Cardiovascular:     Rate and Rhythm: Normal rate and regular rhythm.     Heart sounds: No murmur heard. Pulmonary:     Effort: Pulmonary effort is normal. No respiratory distress.     Breath sounds: Normal breath sounds.  Abdominal:     Palpations: Abdomen is soft.     Tenderness: There is no abdominal tenderness.  Musculoskeletal:        General: No swelling.     Cervical back: Neck supple.  Skin:    General: Skin is warm and dry.     Capillary Refill: Capillary refill takes less than 2 seconds.  Neurological:     Mental Status: She is alert.     Cranial Nerves: No cranial nerve deficit.     Sensory: No sensory deficit.     Motor: No weakness.  Psychiatric:        Mood and Affect: Mood normal.     ED Results and Treatments Labs (all labs ordered are listed, but only abnormal results are displayed) Labs Reviewed  CBC WITH DIFFERENTIAL/PLATELET - Abnormal; Notable for the following components:      Result Value   Hemoglobin 15.3 (*)    Neutro Abs 7.8 (*)    Abs Immature Granulocytes 0.11 (*)    All other components within normal limits  SARS CORONAVIRUS 2 BY RT PCR  COMPREHENSIVE METABOLIC PANEL  BRAIN NATRIURETIC PEPTIDE  D-DIMER, QUANTITATIVE  TROPONIN I (HIGH SENSITIVITY)                                                                                                                          Radiology DG Chest 2 View  Result Date: 01/07/2023 CLINICAL DATA:  Shortness of breath and chest pain. EXAM: CHEST - 2 VIEW COMPARISON:  PA Lat 08/06/2021 FINDINGS: The heart size and mediastinal contours are within normal limits. There is increased haziness in the lingula alongside the cardiac apex with  obscuration of the heart apex not seen previously. This could be due to interval lingular scarring or small infiltrate. There is no substantial pleural effusion. Thoracic spondylosis and mild kyphosis with no new osseous findings. IMPRESSION: 1. Increased haziness in the lingula alongside the cardiac apex, could be due to interval lingular scarring or small infiltrate. 2. No other interval changes. Electronically Signed   By: Almira Bar M.D.   On: 01/07/2023 07:37    Pertinent labs & imaging results that were available during my care of the patient were reviewed by me and considered in my medical decision making (see MDM for details).  Medications Ordered in ED Medications  prochlorperazine (COMPAZINE) injection 10 mg (has no administration in time range)  diphenhydrAMINE (BENADRYL) injection 25 mg (has no administration in time range)  lactated ringers bolus 1,000 mL (has no administration in time range)  Procedures Procedures  (including critical care time)  Medical Decision Making / ED Course   This patient presents to the ED for concern of headache, shortness of breath, this involves an extensive number of treatment options, and is a complaint that carries with it a high risk of complications and morbidity.  The differential diagnosis includes migraine headache, tension headache, Pe, PTX, Pulmonary Edema, ARDS, COPD/Asthma, ACS, CHF exacerbation, Arrhythmia, Pericardial Effusion/Tamponade, Anemia, Sepsis, Acidosis/Hypercapnia, Anxiety, Viral URI  MDM: Patient seen emergency room for evaluation of headache and shortness of breath.  Physical exam largely unremarkable with no focal motor or sensory deficits.  Cardiopulmonary exam unremarkable.  Laboratory evaluation with a CO2 of 18, high sensitive troponin negative, BNP negative, COVID-negative, chest x-ray  with a possible lingular pneumonia.  A D-dimer 1.26 and CT PE obtained that was reassuringly negative for both PE and pneumonia.  More consistent with bronchitis.  Patient received a headache cocktail and on reevaluation both her headache and her shortness of breath resolved.  Given no chest pain and a heart score less than 4 I have low suspicion for ACS.  At this time with overall reassuring workup and symptoms improved patient does not meet inpatient criteria for admission and she is safe for discharge with outpatient follow-up.  Patient been discharged.  Return precautions given of which she voiced understanding.  Additional history obtained: = -External records from outside source obtained and reviewed including: Chart review including previous notes, labs, imaging, consultation notes   Lab Tests: -I ordered, reviewed, and interpreted labs.   The pertinent results include:   Labs Reviewed  CBC WITH DIFFERENTIAL/PLATELET - Abnormal; Notable for the following components:      Result Value   Hemoglobin 15.3 (*)    Neutro Abs 7.8 (*)    Abs Immature Granulocytes 0.11 (*)    All other components within normal limits  SARS CORONAVIRUS 2 BY RT PCR  COMPREHENSIVE METABOLIC PANEL  BRAIN NATRIURETIC PEPTIDE  D-DIMER, QUANTITATIVE  TROPONIN I (HIGH SENSITIVITY)      EKG   EKG Interpretation Date/Time:  Thursday January 07 2023 07:00:52 EDT Ventricular Rate:  79 PR Interval:  142 QRS Duration:  74 QT Interval:  386 QTC Calculation: 442 R Axis:   -7  Text Interpretation: Normal sinus rhythm Abnormal ECG When compared with ECG of 13-Sep-2022 09:14, PREVIOUS ECG IS PRESENT Confirmed by Syniyah Bourne ([id number]) on 01/07/2023 7:16:07 AM         Imaging Studies ordered: I ordered imaging studies including chest x-ray, CT PE I independently visualized and interpreted imaging. I agree with the radiologist interpretation   Medicines ordered and prescription drug management: Meds ordered  this encounter  Medications   prochlorperazine (COMPAZINE) injection 10 mg   diphenhydrAMINE (BENADRYL) injection 25 mg   lactated ringers bolus 1,000 mL    -I have reviewed the patients home medicines and have made adjustments as needed  Critical interventions none   Cardiac Monitoring: The patient was maintained on a cardiac monitor.  I personally viewed and interpreted the cardiac monitored which showed an underlying rhythm of: NSR  Social Determinants of Health:  Factors impacting patients care include: none   Reevaluation: After the interventions noted above, I reevaluated the patient and found that they have :improved  Co morbidities that complicate the patient evaluation  Past Medical History:  Diagnosis Date   Barrett esophagus    Coronary artery disease    GERD (gastroesophageal reflux disease)    Headache    migraines  1-2/mo   Hypertension    Obesity (BMI 30-39.9)    Postmenopausal bleeding 01/20/2019      Dispostion: I considered admission for this patient, but at this time she does not meet inpatient criteria for admission she is safe for discharge with outpatient follow-up     Final Clinical Impression(s) / ED Diagnoses Final diagnoses:  None     @PCDICTATION @    Glendora Score, MD 01/07/23 1751

## 2023-01-07 NOTE — ED Triage Notes (Signed)
Patient reports migraine since Tuesday.  Patient seen at [facility] last night and treated and states that she started having chest pain and shortness of breath after she left.

## 2023-01-11 ENCOUNTER — Encounter: Payer: Managed Care, Other (non HMO) | Admitting: Physical Therapy

## 2023-01-13 ENCOUNTER — Encounter: Payer: Managed Care, Other (non HMO) | Admitting: Physical Therapy

## 2023-01-18 ENCOUNTER — Encounter: Payer: Managed Care, Other (non HMO) | Admitting: Physical Therapy

## 2023-01-20 ENCOUNTER — Encounter: Payer: Managed Care, Other (non HMO) | Admitting: Physical Therapy

## 2023-01-25 ENCOUNTER — Encounter: Payer: Managed Care, Other (non HMO) | Admitting: Physical Therapy

## 2023-01-27 ENCOUNTER — Encounter: Payer: Managed Care, Other (non HMO) | Admitting: Physical Therapy

## 2023-01-27 NOTE — Progress Notes (Signed)
This encounter was created in error - please disregard.

## 2023-05-16 ENCOUNTER — Other Ambulatory Visit: Payer: Self-pay | Admitting: Nurse Practitioner

## 2023-08-04 ENCOUNTER — Encounter: Payer: Self-pay | Admitting: Cardiovascular Disease

## 2023-08-05 ENCOUNTER — Emergency Department

## 2023-08-05 ENCOUNTER — Telehealth: Payer: Self-pay | Admitting: Cardiovascular Disease

## 2023-08-05 ENCOUNTER — Encounter: Payer: Self-pay | Admitting: Emergency Medicine

## 2023-08-05 ENCOUNTER — Other Ambulatory Visit: Payer: Self-pay

## 2023-08-05 ENCOUNTER — Emergency Department
Admission: EM | Admit: 2023-08-05 | Discharge: 2023-08-05 | Disposition: A | Discharge status: Home / Self Care | Attending: Emergency Medicine | Admitting: Emergency Medicine

## 2023-08-05 DIAGNOSIS — R0602 Shortness of breath: Secondary | ICD-10-CM | POA: Insufficient documentation

## 2023-08-05 DIAGNOSIS — Z87891 Personal history of nicotine dependence: Secondary | ICD-10-CM | POA: Insufficient documentation

## 2023-08-05 DIAGNOSIS — I10 Essential (primary) hypertension: Secondary | ICD-10-CM | POA: Diagnosis not present

## 2023-08-05 DIAGNOSIS — R519 Headache, unspecified: Secondary | ICD-10-CM | POA: Diagnosis present

## 2023-08-05 DIAGNOSIS — J45909 Unspecified asthma, uncomplicated: Secondary | ICD-10-CM | POA: Diagnosis not present

## 2023-08-05 LAB — CBC
HCT: 41 % (ref 36.0–46.0)
Hemoglobin: 14 g/dL (ref 12.0–15.0)
MCH: 31.9 pg (ref 26.0–34.0)
MCHC: 34.1 g/dL (ref 30.0–36.0)
MCV: 93.4 fL (ref 80.0–100.0)
Platelets: 291 10*3/uL (ref 150–400)
RBC: 4.39 MIL/uL (ref 3.87–5.11)
RDW: 13.9 % (ref 11.5–15.5)
WBC: 8.1 10*3/uL (ref 4.0–10.5)
nRBC: 0 % (ref 0.0–0.2)

## 2023-08-05 LAB — COMPREHENSIVE METABOLIC PANEL WITH GFR
ALT: 25 U/L (ref 0–44)
AST: 23 U/L (ref 15–41)
Albumin: 3.5 g/dL (ref 3.5–5.0)
Alkaline Phosphatase: 88 U/L (ref 38–126)
Anion gap: 7 (ref 5–15)
BUN: 13 mg/dL (ref 6–20)
CO2: 25 mmol/L (ref 22–32)
Calcium: 9.1 mg/dL (ref 8.9–10.3)
Chloride: 107 mmol/L (ref 98–111)
Creatinine, Ser: 0.86 mg/dL (ref 0.44–1.00)
GFR, Estimated: >60 mL/min (ref >60)
Glucose, Bld: 102 mg/dL — ABNORMAL HIGH (ref 70–99)
Potassium: 3.7 mmol/L (ref 3.5–5.1)
Sodium: 139 mmol/L (ref 135–145)
Total Bilirubin: 0.5 mg/dL (ref 0.0–1.2)
Total Protein: 6.6 g/dL (ref 6.5–8.1)

## 2023-08-05 LAB — BRAIN NATRIURETIC PEPTIDE: B Natriuretic Peptide: 17.6 pg/mL (ref 0.0–100.0)

## 2023-08-05 MED ORDER — PROCHLORPERAZINE EDISYLATE 10 MG/2ML IJ SOLN
10.0000 mg | Freq: Once | INTRAMUSCULAR | Status: AC
  Administered 2023-08-05: 10 mg via INTRAVENOUS
  Filled 2023-08-05: qty 2

## 2023-08-05 MED ORDER — DIPHENHYDRAMINE HCL 50 MG/ML IJ SOLN
25.0000 mg | Freq: Once | INTRAMUSCULAR | Status: AC
  Administered 2023-08-05: 25 mg via INTRAVENOUS
  Filled 2023-08-05: qty 1

## 2023-08-05 MED ORDER — IPRATROPIUM-ALBUTEROL 0.5-2.5 (3) MG/3ML IN SOLN
3.0000 mL | Freq: Once | RESPIRATORY_TRACT | Status: AC
  Administered 2023-08-05: 3 mL via RESPIRATORY_TRACT
  Filled 2023-08-05: qty 3

## 2023-08-05 MED ORDER — PREDNISONE 20 MG PO TABS: 40.0000 mg | ORAL_TABLET | Freq: Every day | ORAL | 0 refills | Status: AC

## 2023-08-05 MED ORDER — KETOROLAC TROMETHAMINE 30 MG/ML IJ SOLN
30.0000 mg | Freq: Once | INTRAMUSCULAR | Status: AC
  Administered 2023-08-05: 30 mg via INTRAVENOUS
  Filled 2023-08-05: qty 1

## 2023-08-05 NOTE — ED Triage Notes (Signed)
 Pt c/o migraine that started yesterday. Pt took her prescribed medication without relief. Pt took advil this morning without relief. Pt says she feels like she can't breathe well despite using her inhaler since Sunday. Pt only used her inhaler once on Sunday and has not used it since. PT able to speak in complete sentences without any signs of respiratory distress.

## 2023-08-05 NOTE — Telephone Encounter (Signed)
 I can review the plan.  Perhaps we can just try to get her back in to be seen by either Dr. Katheryne Pane or APP

## 2023-08-05 NOTE — ED Notes (Signed)
 See triage note  Presents with migraine  States it started yesterday No relief with meds Slight nausea Also having sensation that she can't breath  Afebrile on arrival

## 2023-08-05 NOTE — Telephone Encounter (Signed)
 Patient called after-hours cardiology line  Verified by name and date of birth  She has seen Dr. Katheryne Pane in the past, the last time she has been seen in Dr. Arlester Ladd clinic was in 2021.  She was seen for atypical chest pain and was referred after an ED presentation.  She ultimately underwent coronary CTA which revealed coronary calcium score of 0 and mild soft plaque noted in the LAD.  She was started on lisinopril for blood pressure control.  She tells me that since Saturday, 4/12, that she has been experiencing periodic chest discomfort and difficulty breathing.  She reports having an inhaler and using it once without relief of her symptoms.  The symptoms have been persistent and associated with an elevated blood pressure despite lisinopril 40 mg.  Reports that her blood pressure is in the 140s/90s.  States that she does have some congestion and a mild cough over the last few days as well.  She was concerned that given her symptoms, that they were cardiac in nature and that she needed to be seen by Dr. Katheryne Pane.  We discussed how her symptoms could be cardiac in nature though are still nonspecific and would require a broader workup prior to narrowing a focus to only cardiac-related pathologies.  She stated that she was concerned about her symptoms.  Given this and persistent chest discomfort and difficulty breathing, I recommended more urgent evaluation in the emergency room to assess for conditions that may require a more immediate response given the inability to further evaluate her symptoms over the phone.  She was in agreement with this plan.

## 2023-08-05 NOTE — ED Provider Notes (Signed)
 [facility] Provider Note    Event Date/Time   First MD Initiated Contact with Patient 08/05/23 1152     (approximate)   History   Migraine   HPI  Robyn Anderson is a 58 year old female with history of HTN, asthma, tobacco use presenting to the emergency department for evaluation of headache and shortness of breath.  Yesterday, patient had onset of congestion with associated shortness of breath.  Has an albuterol inhaler that she has been using with some improvement.  Denies formal diagnosis of COPD, but does report tobacco history.  No change in sputum.  Additionally reports onset of a headache yesterday.  Similar in character to prior and not sudden in onset but has been persistent.  No associated numbness, tingling, focal weakness.  No fevers or chills.     Physical Exam   Triage Vital Signs: ED Triage Vitals [08/05/23 1136]  Encounter Vitals Group     BP (!) 141/89     Systolic BP Percentile      Diastolic BP Percentile      Pulse Rate 76     Resp 18     Temp 97.9 F (36.6 C)     Temp Source Oral     SpO2 99 %     Weight 141 lb (64 kg)     Height 4\' 9"  (1.448 m)     Head Circumference      Peak Flow      Pain Score 10     Pain Loc      Pain Education      Exclude from Growth Chart     Most recent vital signs: Vitals:   08/05/23 1136  BP: (!) 141/89  Pulse: 76  Resp: 18  Temp: 97.9 F (36.6 C)  SpO2: 99%     General: Awake, interactive  CV:  Regular rate, good peripheral perfusion.  Resp:  Unlabored respirations, lungs good auscultation Abd:  Nondistended, soft, nontender to palpation Neuro:  Alert and oriented, normal extraocular movements, symmetric facial movement, sensation intact over bilateral upper and lower extremities with 5 out of 5 strength.  Normal finger-to-nose testing.   ED Results / Procedures / Treatments   Labs (all labs ordered are listed, but only abnormal results are displayed) Labs Reviewed   COMPREHENSIVE METABOLIC PANEL WITH GFR - Abnormal; Notable for the following components:      Result Value   Glucose, Bld 102 (*)    All other components within normal limits  CBC  BRAIN NATRIURETIC PEPTIDE     EKG EKG independently reviewed interpreted by myself (ER attending) demonstrates:  EKG demonstrate sinus rhythm rate of 77, PR 144, QRS 74, QTc 439, no acute ST changes  RADIOLOGY Imaging independently reviewed and interpreted by myself demonstrates:  CXR without focal consolidation  Formal Radiology Read:  DG Chest 2 View Result Date: 08/05/2023 CLINICAL DATA:  Shortness of breath. EXAM: CHEST - 2 VIEW COMPARISON:  01/07/2023. FINDINGS: Bilateral lung fields are clear. Bilateral costophrenic angles are clear. Normal cardio-mediastinal silhouette. No acute osseous abnormalities. The soft tissues are within normal limits. IMPRESSION: No active cardiopulmonary disease. Electronically Signed   By: Jules Schick M.D.   On: 08/05/2023 12:05    PROCEDURES:  Critical Care performed: No  Procedures   MEDICATIONS ORDERED IN ED: Medications  ketorolac (TORADOL) 30 MG/ML injection 30 mg (30 mg Intravenous Given 08/05/23 1229)  diphenhydrAMINE (BENADRYL) injection 25 mg (25 mg Intravenous Given 08/05/23 1225)  prochlorperazine (  COMPAZINE) injection 10 mg (10 mg Intravenous Given 08/05/23 1227)  ipratropium-albuterol (DUONEB) 0.5-2.5 (3) MG/3ML nebulizer solution 3 mL (3 mLs Nebulization Given 08/05/23 1238)     IMPRESSION / MDM / ASSESSMENT AND PLAN / ED COURSE  I reviewed the triage vital signs and the nursing notes.  Differential diagnosis includes, but is not limited to, migraine type headache, other benign headache, low suspicion intracranial bleed, other emergent intracranial process given headache similar in character to prior and reassuring exam.  Regarding shortness of breath, consideration for pneumonia, pneumothorax, asthma or COPD flare  Patient's presentation is most  consistent with acute presentation with potential threat to life or bodily function.  58 year old female presenting with headache and shortness of breath.  Labs sent from triage are reassuring.  X-Elvin Mccartin without pneumonia.  Treated symptomatically with migraine cocktail and DuoNeb.  On reevaluation patient does report feeling improved.  She is comfortable with discharge home.  Suspect likely viral illness triggering asthma flare.  Will DC with short steroid course.  Has albuterol inhaler already.  Strict return precautions provided.  Patient discharged in stable condition.      FINAL CLINICAL IMPRESSION(S) / ED DIAGNOSES   Final diagnoses:  Acute nonintractable headache, unspecified headache type  Shortness of breath     Rx / DC Orders   ED Discharge Orders          Ordered    predniSONE (DELTASONE) 20 MG tablet  Daily with breakfast        08/05/23 1341             Note:  This document was prepared using Dragon voice recognition software and may include unintentional dictation errors.   Claria Crofts, MD 08/05/23 1341

## 2023-08-05 NOTE — Discharge Instructions (Addendum)
 You are seen in the emergency department today for evaluation of your headache and shortness of breath.  Your testing was overall reassuring.  I am glad you are feeling better.  I suspect you may have a viral illness that is triggering an asthma flare.  I sent a short course of steroids to your pharmacy to help with your symptoms.  You can continue to use your albuterol inhaler as needed.  Follow with your primary care doctor for further evaluation.  Return to the ER for new or worsening symptoms.

## 2023-10-26 ENCOUNTER — Ambulatory Visit

## 2024-04-25 NOTE — Progress Notes (Signed)
 Chief Complaint: Chief Complaint  Patient presents with   Follow-up  Neck and bilateral arm pain as well as back and bilateral leg pain  HPI: Patient is a pleasant 59 y.o. female seen in follow-up for the evaluation of neck pain traveling into the bilateral arms, left worse than right and back pain traveling into the bilateral legs.  She would like to focus today's visit on her back and bilateral leg pain.  She did do very well after her last lumbar epidural injection in February 2025 which lasted up until about a couple weeks ago.  She denies any injury.  She rates her pain as a 10/10.  It is constant, sharp, aching, burning. She endorses numbness and tingling in her bilateral hands as well as weakness in her legs but denies any loss of control of bowel or bladder. Pain is worse with lifting, twisting, driving and better with nothing.  She has been on tramadol , meloxicam, prednisone , Robaxin, ibuprofen , Lyrica but pain has not been alleviated.  She is interested in moving forward with epidural steroid injections.  Review of Systems: A 10 point review of systems is negative, except for the pertinent positives and negatives detailed in the HPI.  PMH: Past Medical History:  Diagnosis Date   Atypical chest pain 04/25/2018   Bilateral arm pain 05/03/2019   Bilateral leg pain 03/31/2019   Burning sensation 07/03/2019   Carpal tunnel syndrome    Carpal tunnel syndrome of left wrist 09/27/2015   Cervical stenosis (uterine cervix) 03/31/2019   DDD (degenerative disc disease), lumbar 03/04/2020   Depression    Essential hypertension 04/22/2018   Formatting of this note might be different from the original. Essential hypertension  Last Assessment & Plan:  Formatting of this note might be different from the original. History of essential hypertension on lisinopril  blood pressure measured today at 104/70.   Essential tremor 10/21/2011   GERD (gastroesophageal reflux disease) 08/28/2019    Headache disorder 03/31/2019   Hip pain, right 04/04/2020   History of chest pain 04/22/2018   Formatting of this note might be different from the original. No CAD and Ca++ score 0 on CTA Feb 2020  Last Assessment & Plan:  Formatting of this note might be different from the original. History of atypical chest pain in the past with a coronary calcium score of 0.  She has had no further chest pain.   History of chest pain 04/22/2018   Hyperlipidemia 06/12/2020   Irritable colon 04/08/2012   Left ovarian cyst 01/02/2019   Lumbar radiculopathy 01/29/2020   Malaise and fatigue 10/21/2011   Migraine with aura and without status migrainosus, not intractable 04/06/2019   Formatting of this note might be different from the original. Dr Margaret saw her in June 2020- Dr Lane at [facility] now follows her.    Last Assessment & Plan:  Formatting of this note might be different from the original. Dr Margaret saw her in June 2020- Dr Lane at [facility] now follows her.   Migraines    Neuropathy 04/06/2019   Formatting of this note might be different from the original. LE neuropathy- followed by Dr Wilfred  Last Assessment & Plan:  Formatting of this note might be different from the original. LE neuropathy- followed by Dr Wilfred   Nondisplaced fracture of coronoid process of right ulna, initial encounter for closed fracture 02/22/2018   Obesity (BMI 30-39.9), unspecified 05/04/2014   Poor intravenous access    Post-menopausal bleeding 01/02/2019  Prehypertension 05/04/2014   Right carpal tunnel syndrome 08/22/2015   Right wrist pain 08/12/2015   Status post carpal tunnel release 10/15/2015   Tobacco abuse 02/15/2017   Formatting of this note might be different from the original. Tobacco abuse  Last Assessment & Plan:  Formatting of this note might be different from the original. Ongoing tobacco abuse of 1/2 pack/day recalcitrant to respect modification.    Total body pain, unspecified 07/03/2019   Ulnar neuropathy at elbow    Ulnar neuropathy of right upper extremity 08/22/2015   Uterine fibroid 01/02/2019   Vitamin B12 deficiency anemia 04/08/2012   Vitamin D deficiency 10/27/2011     PSH: Past Surgical History:  Procedure Laterality Date   ENDOSCOPIC CARPAL TUNNEL RELEASE Left 09/24/2005   NEUROPLASTY &/OR TRANSPOSITION MEDIAN NERVE AT CARPAL TUNNEL Right 01/16/2021   Procedure: R-NEUROPLASTY AND/OR TRANSPOSITION; MEDIAN NERVE AT CARPAL TUNNEL;  Surgeon: Klifto, Christopher Scott, MD;  Location: [facility] OR;  Service: Orthopedics;  Laterality: Right;   TRANSFER ADJACENT TISSUE ARM Right 01/16/2021   Procedure: R - ADJACENT TISSUE TRANSFER OR REARRANGEMENT, ARM; DEFECT 10 SQ CM OR LESS;  Surgeon: Klifto, Christopher Scott, MD;  Location: [facility] OR;  Service: Orthopedics;  Laterality: Right;   De Quervain's release  Left 08/24/2022   By Dr. Kathlynn   ENDOSCOPIC CARPAL TUNNEL RELEASE Left 01/28/2024   By Dr. Kathlynn   ABDOMINAL HYSTERECTOMY W/ PARTIAL VAGINACTOMY     CESAREAN SECTION     HYSTERECTOMY  04/18/2019   kidney blockage removed     TUBAL LIGATION       Family History: Family History  Problem Relation Name Age of Onset   Colon cancer Mother Aalia Greulich    High blood pressure (Hypertension) Father Chaelyn Bunyan    Prostate cancer Father Betrice Wanat    Multiple sclerosis Sister     High blood pressure (Hypertension) Brother Wadie Olden    Kidney disease Brother Amandine Covino    Stroke Maternal Grandmother Foy Chute    Seizures Son Bernardino Olden      Social History: Social History   Socioeconomic History   Marital status: Single  Tobacco Use   Smoking status: Every Day    Current packs/day: 0.50    Average packs/day: 0.5 packs/day for 37.0 years (18.5 ttl pk-yrs)    Types: Cigarettes    Passive exposure: Current   Smokeless tobacco: Never  Vaping Use   Vaping status: Never Used  Substance  and Sexual Activity   Alcohol use: No   Drug use: No   Sexual activity: Defer    Partners: Male   Social Drivers of Health   Financial Resource Strain: Patient Declined (04/24/2024)   Overall Financial Resource Strain (CARDIA)    Difficulty of Paying Living Expenses: Patient declined  Food Insecurity: Patient Declined (04/24/2024)   Hunger Vital Sign    Worried About Running Out of Food in the Last Year: Patient declined    Ran Out of Food in the Last Year: Patient declined  Recent Concern: Food Insecurity - Food Insecurity Present (03/07/2024)   Received from [facility]   Hunger Vital Sign    Within the past 12 months, you worried that your food would run out before you got the money to buy more.: Sometimes true    Within the past 12 months, the food you bought just didn't last and you didn't have money to get more.: Sometimes true  Transportation Needs: Patient Declined (04/24/2024)   PRAPARE -  Administrator, Civil Service (Medical): Patient declined    Lack of Transportation (Non-Medical): Patient declined     Allergies: Allergies  Allergen Reactions   Effexor [Venlafaxine] Shortness Of Breath, Diarrhea and Headache   Atorvastatin Other (See Comments)    Muscle pain (generalized) Muscle pain (generalized)    Diclofenac Other (See Comments)    lightheaded   Hydrocodone-Acetaminophen  Nausea And Vomiting and Vomiting    Other reaction(s): GI Intolerance. Pt states she CAN take Acetaminophen .   Topiramate Other (See Comments)    Tremors Other reaction(s): Other (See Comments) Tremours per patient   Ezetimibe Diarrhea     Medications:  Current Outpatient Medications:    albuterol  90 mcg/actuation inhaler, Inhale 2 inhalations into the lungs every 6 (six) hours as needed for Wheezing, Disp: , Rfl:    butalbital-acetaminophen -caffeine (FIORICET) 50-325-40 mg tablet, TAKE 1 TABLET BY MOUTH AT HEADACHE ONSET, CAN REPEAT AFTER 4 HOURS. NO MORE  THAN 2 TABLETS IN 24 HOURS. DONT TAKE MORE THAN 2-3 TIMES A WEEK, Disp: 20 tablet, Rfl: 0   clonazePAM (KLONOPIN) 0.5 MG tablet, Take 0.75mg  nightly, Disp: 45 tablet, Rfl: 2   ergocalciferol, vitamin D2, 1,250 mcg (50,000 unit) capsule, Take 50,000 Units by mouth every 7 (seven) days, Disp: , Rfl:    esomeprazole (NEXIUM) 40 MG DR capsule, Take 40 mg by mouth 3 (three) times daily, Disp: , Rfl:    gabapentin  (NEURONTIN ) 300 MG capsule, Take 1200mg  nightly and additional 300 mg as needed, Disp: 375 capsule, Rfl: 1   galcanezumab-gnlm (EMGALITY SYRINGE) 300 mg/3 mL (100 mg/mL x 3) Syrg, INJECT 100MG  INTO THE SKIN EVERY 28 DAYS, Disp: , Rfl:    lisinopriL  (ZESTRIL ) 30 MG tablet, Take 40 mg by mouth once daily, Disp: , Rfl:    mirtazapine (REMERON) 7.5 MG tablet, Taking 1/2 tablet nightly, Disp: 15 tablet, Rfl: 5   onabotulinumtoxinA (BOTOX) 200 unit SolR, INJECT 155 UNITS INTRAMUSCULARLY EVERY 90 DAYS, Disp: 1 each, Rfl: 1   ondansetron  (ZOFRAN -ODT) 4 MG disintegrating tablet, Take 4 mg by mouth, Disp: , Rfl:   Vitals: Vitals:   04/25/24 1331  BP: 136/88  Pulse: 77  Temp: 36.4 C (97.5 F)  TempSrc: Oral  Weight: 63.5 kg (140 lb)  Height: 142.2 cm (4' 8)  PainSc: 10-Worst pain ever  PainLoc: Back   Physical exam: Motor exam 5/5 bilateral lower extremity; sensation soft touch intact in bilateral lower extremity; positive seated slump on the right  Imaging: MRI cervical spine done at [facility] 06/2020: C2-3 mild left NFS; C3-4 mild CCS and bilateral NFS; C4-5 mild CCS, mild right and moderate left NFS; C5-6 mild CCS, severe right and moderate left NFS  MRI lumbar spine 08/2022 done at [facility]: Facet arthropathy L3-4, L4-5 and L5-S1 with minimal neuroforaminal stenosis at L5-S1 per my read  CT cervical spine 10/2020: Similar to MRI cervical spine 06/2020  EMG 06/18/2020 bilateral upper extremities done by Dr. 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  -s/p bilateral L5-S1 TFESI 11/20/2022 with 75% improvement -s/p bilateral L5-S1 TFESI 2/14/025  with 70% improvement  Cervicalgia  Right cervical radiculitis -s/p C7-T1 IL ESI 06/16/2022 with 10% relief -s/p right C5-6 TFESI 09/03/2022 with 80% improvement -s/p right C5-6 TFESI 01/01/2023 with 80% improvement -s/p right C5-6 TFESI 05/07/2023 with 60% improvement -  s/p left C5-6 TFESI 07/29/2023 with 70% improvement  History of hypertension  Status post right carpal tunnel release surgery 2022  Left De quervain tenosynovitis -per sports medicine  Left tennis elbow -s/p left lateral epicondyle bursa injection 10/16/2020 with 40% improvement  **Not able to tolerate NSAIDs due to feeling short of breath  Plan: She noted nearly 1 year of 70% relief with her prior lumbar epidural injection on 06/04/2023.  Unfortunately her pain did return a couple weeks ago and it does affect her ability to sleep.  She has been on gabapentin  as well as Flexeril for several months but pain is not improved.  She is also been doing a home exercise program during this time but pain is not relieved.  I did discuss with her other treatment options including an epidural injection and she is interested in moving forward with this.  I will set her up for bilateral L5-S1 TFESI.  Despite the risk factor of hypertension I do believe benefits outweigh the risks.   Bilateral wrist pain per Ortho.  Patient states that she does have chronic renal stones -per urology.  Follow-up 2 weeks postinjection.  Consider repeat with [facility].  Will consider updated MRI lumbar spine. She may call back for a left C5-6 TFESI at any time.  She may call back for right C5-6 TFESI at any time.  If no better would consider referral back to neurosurgery.     Of note, she is not able to tolerate p.o. NSAIDs.  She has already been on tramadol  as well as tizanidine and gabapentin  but pain was not controlled with this.  Patient agrees with above plan.  Answered all questions.

## 2024-04-25 NOTE — Progress Notes (Signed)
 This note has been created using automated tools and reviewed for accuracy by [facility].  Chief Complaint  Patient presents with   Left Wrist - Follow-up    Subjective  Robyn Anderson is a 59 y.o. female who presents for Follow-up of the Left Wrist HPI History of Present Illness Robyn Anderson Towner is a 59 year old female with left carpal tunnel syndrome status post left carpal tunnel release who presents with persistent left hand pain and weakness.  Nearly three months following left carpal tunnel release, she continues to experience pain and burning localized to the surgical scar. She describes stinging and stabbing sensations that are particularly bothersome. She massages the scar with lotion as part of her self-care routine.  She has persistent weakness in the left hand, with difficulty performing tasks requiring grip strength. 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 She describes aching and abnormal sensations radiating into the fingers, which worsen with repetitive hand use.  Her current medication regimen includes gabapentin  900 mg nightly for symptom management.  She also experiences ongoing right carpal tunnel symptoms, though she has not undergone surgical intervention for the right hand.  Review of Systems  Patient Active Problem List  Diagnosis   Right wrist pain   Ulnar neuropathy of right upper extremity   Carpal tunnel syndrome, right   Status post carpal tunnel release   Headache disorder   Bilateral leg pain   Bilateral arm pain   Burning sensation   Total body pain, unspecified   Lumbar radiculopathy   Hip pain, right   Atypical chest pain   GERD (gastroesophageal reflux disease)   Cervical stenosis (uterine cervix)   DDD (degenerative disc disease), lumbar   Essential hypertension    Essential tremor   History of chest pain   Hyperlipidemia   Irritable colon   Left ovarian cyst   Malaise and fatigue   Migraine with aura and without status migrainosus, not intractable   Neuropathy   Nondisplaced fracture of coronoid process of right ulna, initial encounter for closed fracture   Obesity (BMI 30-39.9), unspecified   Post-menopausal bleeding   Prehypertension   Tobacco abuse   Uterine fibroid   Vitamin B12 deficiency anemia   Vitamin D deficiency   Right arm pain   Difficulty sleeping   Numbness and tingling   Cervical radiculopathy   Anxiety, generalized   Kidney stones   Hematuria    Outpatient Medications Prior to Visit  Medication Sig Dispense Refill   albuterol  90 mcg/actuation inhaler Inhale 2 inhalations into the lungs every 6 (six) hours as needed for Wheezing     ergocalciferol, vitamin D2, 1,250 mcg (50,000 unit) capsule Take 50,000 Units by mouth every 7 (seven) days     esomeprazole (NEXIUM) 40 MG DR capsule Take 40 mg by mouth 3 (three) times daily     galcanezumab-gnlm (EMGALITY SYRINGE) 300 mg/3 mL (100 mg/mL x 3) Syrg INJECT 100MG  INTO THE SKIN EVERY 28 DAYS     lisinopriL  (ZESTRIL ) 30 MG tablet Take 40 mg by mouth once daily     onabotulinumtoxinA (BOTOX) 200 unit SolR INJECT 155 UNITS INTRAMUSCULARLY EVERY 90 DAYS 1 each 1   ondansetron  (ZOFRAN -ODT) 4 MG disintegrating tablet Take 4 mg by mouth     butalbital-acetaminophen -caffeine (FIORICET) 50-325-40 mg tablet TAKE 1 TABLET BY MOUTH AT HEADACHE ONSET, CAN REPEAT AFTER  4 HOURS. NO MORE THAN 2 TABLETS IN 24 HOURS. DONT TAKE MORE THAN 2-3 TIMES A WEEK 20 tablet 0   clonazePAM (KLONOPIN) 0.5 MG tablet Take 1/2 tab three times a day 45 tablet 2   gabapentin  (NEURONTIN ) 300 MG capsule Take 1200mg  nightly (Patient taking differently: Take 1,200 mg by mouth at bedtime) 360 capsule 1   mirtazapine (REMERON) 7.5 MG tablet Take 1.5 tablets nightly (Patient taking  differently: Taking 1/2 tablet QHS) 45 tablet 5   ezetimibe (ZETIA) 10 mg tablet Take 10 mg by mouth once daily (Patient not taking: Reported on 04/24/2024)     gemfibrozil (LOPID) 600 mg tablet Take 600 mg by mouth 2 (two) times daily (Patient not taking: Reported on 04/24/2024)     HYDROcodone-acetaminophen  (NORCO) 5-325 mg tablet Take 1 tablet by mouth every 6 (six) hours as needed (Patient not taking: Reported on 04/24/2024) 15 tablet 0   No facility-administered medications prior to visit.      Objective  Vitals:   04/24/24 0902  BP: (!) 146/84  Weight: 63.5 kg (140 lb)  Height: 142.2 cm (4' 8)  PainSc:   8  PainLoc: Wrist   Body mass index is 31.39 kg/m.  Home Vitals:     Physical Exam Physical Exam GENERAL: Alert, cooperative, well developed, no acute distress. HEENT: Normocephalic, normal oropharynx, moist mucous membranes. CHEST: Clear to auscultation bilaterally, no wheezes, rhonchi, or crackles. CARDIOVASCULAR: Normal heart rate and rhythm, S1 and S2 normal without murmurs. ABDOMEN: Soft, non-tender, non-distended, without organomegaly, normal bowel sounds. EXTREMITIES: No cyanosis or edema. MUSCULOSKELETAL: Hand examination normal, able to make a fist and squeeze. Dense scar tissue on left hand post-carpal tunnel release. NEUROLOGICAL: Cranial nerves grossly intact, moves all extremities without gross motor or sensory deficit.   Results       Assessment/Plan:   Assessment & Plan Status post left carpal tunnel release with persistent symptoms Three months after left carpal tunnel release, she experiences pain, burning, and tenderness at the scar site, with dense scar tissue, weakness, persistent sensory disturbances, and functional limitations despite gabapentin  therapy. Symptoms worsen with increased use at work, indicating incomplete recovery. Recommend a trial of hand therapy with a certified hand therapist to address these issues. Provided a prescription for  hand therapy at [facility] in [place]. She should contact the hand therapy department to coordinate appointments around her work schedule. Advise notifying the office if symptoms do not improve with hand therapy for further evaluation.  Right carpal tunnel syndrome She continues to have symptoms of right carpal tunnel syndrome without prior surgical intervention. Treatment is deferred until left hand recovery. Instruct her to report any worsening of right hand symptoms or if left hand recovery allows for consideration of right hand treatment.  Recording duration: 6 minutes Diagnoses and all orders for this visit:  Status post carpal tunnel release -     Ambulatory Referral to Occupational Therapy  Carpal tunnel syndrome, bilateral upper limbs -     Ambulatory Referral to Occupational Therapy    This visit was coded based on medical decision making (MDM).           Future Appointments     Date/Time Provider Department Center Visit Type   04/25/2024 1:45 PM (Arrive by 1:30 PM) Dodson Nest, MD [facility] RETURN VISIT   11/30/2024 2:40 PM (Arrive by 2:10 PM) Darol Bayard, MD [facility] [address] [facility] NEW PATIENT  There are no Patient Instructions on file for this visit.  An after visit summary was provided for the patient either in written format (printed) or through [facility].  This note has been created using automated tools and reviewed for accuracy by [facility].

## 2024-05-05 NOTE — Procedures (Signed)
 [facility] - Physiatry  PROCEDURE: 1. Bilateral L5-S1 transforaminal epidural injections under fluoroscopic guidance. 2. Use of fluoroscopic guidance was required to ensure adequate delivery of medication into the epidural space and for proper needle placement. 3. The patient was monitored with continuous pulse oximetry throughout the procedures.   DIAGNOSIS/INDICATION FOR PROCEDURE: The patient presents today at my request for bilateral lumbar epidural steroid injections.  She does complain of back pain traveling into the bilateral legs.  MRI shows L5-S1 mild bilateral NFS.  She did have this injection on 06/04/2023 with 70% improvement for over 6 months.  There were no problems or complications postinjection.  This is being done to hopefully avoid surgery.     IMPRESSION/RESULTS: Patient tolerated the procedure well without any complications.  She will follow-up with me 2 weeks postinjection.  There was a neutral tilt and 35 degree rotation.  PAIN: Preinjection pain score: 10/10 Postinjection pain score: 6/10 Percent change: 40% improvement  INFORMED CONSENT: The patient understood the potential risks and benefits of the procedure, which were explained to the patient prior to the procedure.  The patient read and signed the consent stating complete understanding of the information, and wished to proceed with the procedure, there were no barriers to understanding.  Ample time was given for any questions to be answered prior to the procedure. The risks of the procedure were explained including, but not limited to the risk of bleeding and/or infection into the spinal area, intervertebral discs, or epidural space; nerve injury; nerve irritation; reaction to medications; etc.  The patient denied any history of bleeding disorders, allergies to medications used or other medical contraindications to the procedure.  No promises were given to any expected outcome.      PROCEDURE: A time out was performed by physician and assistant.  Correct patient, procedure, plan of care, site, position, and equipment were verified.  Identical procedural technique was used for both epidural injections.  The patient was sterilely prepped with a triple scrub of betadine solution and draped in the prone position.  Careful attention was paid to aseptic technique throughout the procedure.  Then, the Left L5-S1 neuroforamen was identified under fluoroscopic guidance.  The overlying skin and subcutaneous tissues were anesthetized with approximately 2-3 cc of 1% Xylocaine .  A 25 gauge 5 inch spinal needle was inserted down into the foramen.  Approximately 2 cc of Omnipaque -240 mgI/mL contrast was infiltrated under real time-fluoroscopy demonstrating satisfactory spread along the exiting spinal nerve and into the epidural space without vascular uptake.  No aspirate was noted.  Spot films were taken.  Then half a solution containing 1.0 cc of dexamethasone  sodium phosphate  10 mg/cc and 2 cc of 1% preservative-free lidocaine  was slowly infiltrated around the spinal nerve and into the epidural space under real-time fluoroscopy.  There was good contrast washout.  The needle was removed.   Then, the Right L5-S1 neuroforamen was identified under fluoroscopic guidance.  The overlying skin and subcutaneous tissues were anesthetized with approximately 2-3 cc of 1% Xylocaine .  A 25 gauge 5 inch spinal needle was inserted down into the foramen.  Approximately 2 cc of Omnipaque -240 mgI/mL contrast was infiltrated under real time-fluoroscopy demonstrating satisfactory spread along the exiting spinal nerve and into the epidural space without vascular uptake.  No aspirate was noted.  Spot films were taken.  Then the other half of the solution containing 1.0 cc of dexamethasone  sodium phosphate  10 mg/cc and 2 cc of 1% preservative-free lidocaine  was slowly  infiltrated around the spinal nerve and into the  epidural space under real-time fluoroscopy.  There was good contrast washout.  The needle was removed.  There were no complications.  0 cc's dexamethasone  wasted on 05/05/2024 at 9:00 AM   DISCHARGE SUMMARY: The patient was monitored post-injection for 30 minutes in the recovery area and remained stable without evidence of complications.  The patient was discharged with discharge instructions in stable condition. Patient was instructed to contact us  with any problems.  Procedure fluoro time: 7.6 seconds. Fluoroscopy dose: 3.46 mGy.  This is below the dose threshold #1 (5000 mGy).    DELON GINS, MD

## 2024-05-08 ENCOUNTER — Encounter: Payer: Self-pay | Admitting: Occupational Therapy

## 2024-05-08 ENCOUNTER — Ambulatory Visit: Attending: Orthopedic Surgery | Admitting: Occupational Therapy

## 2024-05-08 DIAGNOSIS — G5602 Carpal tunnel syndrome, left upper limb: Secondary | ICD-10-CM | POA: Insufficient documentation

## 2024-05-08 DIAGNOSIS — M6281 Muscle weakness (generalized): Secondary | ICD-10-CM | POA: Diagnosis present

## 2024-05-08 NOTE — Therapy (Signed)
 " OUTPATIENT OCCUPATIONAL THERAPY ORTHO EVALUATION  Patient Name: Robyn Anderson MRN: [id number] DOB:[birth year], 59 y.o., female Today's Date: 05/08/2024  PCP: Jamee Grip, MD REFERRING PROVIDER: Ozell Flake, MD  END OF SESSION:  OT End of Session - 05/08/24 1744     Visit Number 1    Number of Visits 12    Date for Recertification  07/17/24    OT Start Time 1650    OT Stop Time 1735    OT Time Calculation (min) 45 min    Activity Tolerance Patient tolerated treatment well    Behavior During Therapy [facility] for tasks assessed/performed          Past Medical History:  Diagnosis Date   Barrett esophagus    Coronary artery disease    GERD (gastroesophageal reflux disease)    Headache    migraines 1-2/mo   Hypertension    Obesity (BMI 30-39.9)    Postmenopausal bleeding 01/20/2019   Past Surgical History:  Procedure Laterality Date   ATHERECTOMY Right    renal   CARPAL TUNNEL RELEASE Left    CARPAL TUNNEL RELEASE Right 09/27/2015   Procedure: OPEN CARPAL TUNNEL RELEASE RIGHT HAND;  Surgeon: Helayne Glenn, MD;  [facility];  Service: Orthopedics;  Laterality: Right;   CESAREAN SECTION     x2   COLONOSCOPY     ESOPHAGOGASTRODUODENOSCOPY     HYSTEROSCOPY WITH D & C N/A 04/18/2019   Procedure: DILATATION AND CURETTAGE /HYSTEROSCOPY;  Surgeon: Izell Harari, MD;  Location: [facility];  Service: Gynecology;  Laterality: N/A;   LAPAROSCOPIC SALPINGO OOPHERECTOMY Bilateral 04/18/2019   Procedure: LAPAROSCOPIC SALPINGO OOPHORECTOMY;  Surgeon: Izell Harari, MD;  Location: [facility];  Service: Gynecology;  Laterality: Bilateral;   TUBAL LIGATION     Patient Active Problem List   Diagnosis Date Noted   Migraine with aura and without status migrainosus, not intractable 04/06/2019   Neuropathy 04/06/2019   Cervical stenosis (uterine cervix) 03/31/2019   Essential hypertension 04/22/2018   Hypertriglyceridemia 04/22/2018    History of chest pain 04/22/2018   Tobacco abuse 04/22/2018    ONSET DATE: 01/28/24   REFERRING DIAG: Status post left carpal tunnel release with persistent symptoms   THERAPY DIAG:  Carpal tunnel syndrome, left upper limb  Muscle weakness (generalized)  Rationale for Evaluation and Treatment: Rehabilitation  SUBJECTIVE:   SUBJECTIVE STATEMENT: I have to drive 45 min to get to work and my hands hurt after that.  Pt accompanied by: self  PERTINENT HISTORY:   04/24/24 Ortho office visit: Robyn Anderson is a 59 year old female with left carpal tunnel syndrome status post left carpal tunnel release on 01/28/24 who presents with persistent left hand pain and weakness.   Status post left carpal tunnel release with persistent symptoms Three months after left carpal tunnel release, she experiences pain, burning, and tenderness at the scar site, with dense scar tissue, weakness, persistent sensory disturbances, and functional limitations despite gabapentin  therapy. Symptoms worsen with increased use at work, indicating incomplete recovery. Recommend a trial of hand therapy with a certified hand therapist to address these issues.    Right carpal tunnel syndrome She continues to have symptoms of right carpal tunnel syndrome without prior surgical intervention. Treatment is deferred until left hand recovery. Instruct her to report any worsening of right hand symptoms or if left hand recovery allows for consideration of right hand treatment.   PRECAUTIONS: None  RED FLAGS: None   WEIGHT BEARING  RESTRICTIONS: No  PAIN:  Are you having pain? Yes: NPRS scale: 7/10  FALLS: Has patient fallen in last 6 months? 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  HAND DOMINANCE:  Left  ADLs: Overall ADLs: difficulty cooking, hooking bra, wiping with toilet paper   FUNCTIONAL OUTCOME MEASURES:  BCTQ: 37/55  UPPER EXTREMITY ROM:     Active ROM Right eval Left eval  Shoulder flexion    Shoulder abduction    Shoulder adduction    Shoulder extension    Shoulder internal rotation    Shoulder external rotation    Elbow flexion    Elbow extension    Wrist flexion 90 80  Wrist extension 60 45  Wrist ulnar deviation    Wrist radial deviation    Wrist pronation 90 90  Wrist supination 90 80  (Blank rows = not tested)  Active ROM Right eval Left eval  Thumb MCP (0-60)    Thumb IP (0-80)    Thumb Radial abd/add (0-55)     Thumb Palmar abd/add (0-45)     Thumb Opposition to Small Finger     Index MCP (0-90)     Index PIP (0-100)     Index DIP (0-70)      Long MCP (0-90)      Long PIP (0-100)      Long DIP (0-70)      Ring MCP (0-90)      Ring PIP (0-100)      Ring DIP (0-70)      Little MCP (0-90)      Little PIP (0-100)      Little DIP (0-70)      (Blank rows = not tested)   UPPER EXTREMITY MMT:   WFL  HAND FUNCTION: Grip strength: Right: 45 lbs; Left: 45 lbs, Lateral pinch: Right: 6 lbs, Left: 10 lbs, and 3 point pinch: Right: 8 lbs, Left: 9 lbs  COORDINATION: TBD  SENSATION: Not tested - reports intermittent numbness to first 3 digits  EDEMA: none  COGNITION: Overall cognitive status: Within functional limits for tasks assessed  OBSERVATIONS: Scab noted medial side of 2nd digit DIP joint    TREATMENT DATE: 05/08/24                                                                                                                            Pt educated on condition management, pain management including contrast bath, and scar massage.  Reports she is unable to wear wrist braces at work as they do not fit under gloves and she cannot use a microscope. Does not wear prefab wrist braces at night - educated on wearing to manage increased  symptoms in morning.  Issued [employer] scar pad for night time wear. Educated  on wearing.   Issued HEP handout with cues to complete exercises. Plan to initiate 1x/day, progress to 2x/day in 3 days if pain free, progress to 3x/day in additional 3 days.  Exercises - Wrist Tendon Gliding  - 3 x daily - 10 reps - Seated Thumb Composite Extension and Flexion  - 3 x daily - 10 reps - Wrist Flexion Extension AROM - Palms Down  - 3 x daily - 10 reps - Seated Carpal Tunnel Tissue Massage  - 1 x daily - 7 x weekly - 3 sets - 10 reps - Standing Median Nerve Glide  - 2 x daily - 7 x weekly - 2 sets - 15 reps - Wrist Circumduction AROM  - 1 x daily - 7 x weekly - 3 sets - 10 reps - Seated Wrist Prayer Stretch  - 1 x daily - 7 x weekly - 3 sets - 10 reps  Educate don use of heat or towel for prolonged supination stretch   PATIENT EDUCATION: Education details: findings of eval and HEP  Person educated: Patient Education method: Explanation, Demonstration, Tactile cues, Verbal cues, and Handouts Education comprehension: verbalized understanding, returned demonstration, verbal cues required, and needs further education   HOME EXERCISE PROGRAM:  Access Code: [email or web address] URL: [email or web address] Date: 05/08/2024 Prepared by: Elston Slot  SHORT TERM GOALS: Target date: 4 weeks   Pt will demo/state understanding of initial HEP to improve pain levels and AROM. Baseline: New to outpt OT Goal status: IN Progress - Tendon Gliding Exc issued at eval.   2.  Pt to trial prefab hand splints to help improve pain and improve overall comfort for functional use of BUE. Recommend prefab vs fab due to chronicity of issue, adjustability, comfort, and ease of cleaning. Baseline: pt does not wear with activity or nighttime.  Goal status: INITIAL   LONG TERM GOALS: Target date: 10 weeks   Patient will demonstrate improved carpal tunnel symptoms as demonstrated by decrease in BCTQ score Baseline:  BCTQ 37/55 Goal status: INITIAL   2.  Pt will independently recall at least 3 joint protection, ergonomics, and body mechanic principles to assist with daily tasks with increased comfort and confidence.  Baseline:  New to outpt OT Goal status: INITIAL   3.  L supination improved to within functional limits symptom-free for patient be able to turn doorknob. Baseline: 80* Goal status: INITIAL   4.  Patient will demonstrate at least 10 lb improvement in dominant LUE grip strength as needed to open jars and other containers. Baseline: Right: 45 lbs Left: 45 lbs Goal status: INITIAL   5.  Patient will demonstrate at least 5 lb improvement in dominant LUE pinch strength as needed to open bags. Baseline:  Left lateral: 10 lbs, L 3pt:  9 lbs  Goal status: INITIAL   6.  Patient will increase L wrist flexion/extension AROM to WNL with pain less than 2/10 Baseline:  flexion 80*, extension 45*  Goal status: INITIAL    ASSESSMENT:  CLINICAL IMPRESSION: Patient seen today for occupational therapy evaluation status post left carpal tunnel release on 01/28/24 with persistent symptoms, pt endorses symptoms worse than prior to surgery. Pt reports pain 7/10. Denies wearing prefab braces as they cannot be worn at work (works 7pm-4am). Educate don condition management, pain mgmt, and scar massage. BCTQ score 37/55. B grip strength lower than average for age 45lb B grip. Demonstrates limited dominant L wrist flexion 80*, wrist extension 45*, and forearm supination 80*. Issued HEP with handout. Pt  unavailable for 2 week due to work schedule but will advance HEP from 1 to 3 sets across the next week. Patient presents with pain and decreased L wrist AROM limiting functional use of dominant left hand in ADLs and IADLs.  Patient can benefit from skilled OT services to decrease pain increase motion and strength to return to prior level of function.   PERFORMANCE DEFICITS: in functional skills including ADLs,  IADLs, ROM, strength, pain, flexibility, decreased knowledge of use of DME, and UE functional use,   and psychosocial skills including environmental adaptation and routines and behaviors.   IMPAIRMENTS: are limiting patient from ADLs, IADLs, rest and sleep, play, leisure, and social participation.   COMORBIDITIES: has no other co-morbidities that affects occupational performance. Patient will benefit from skilled OT to address above impairments and improve overall function.  MODIFICATION OR ASSISTANCE TO COMPLETE EVALUATION: No modification of tasks or assist necessary to complete an evaluation.  OT OCCUPATIONAL PROFILE AND HISTORY: Problem focused assessment: Including review of records relating to presenting problem.  CLINICAL DECISION MAKING: LOW - limited treatment options, no task modification necessary  REHAB POTENTIAL: Good for goals  EVALUATION COMPLEXITY: Low         PLAN:  OT FREQUENCY: 1x/week  OT DURATION: 10 weeks  PLANNED INTERVENTIONS: 97168 OT Re-evaluation, 97535 self care/ADL training, 02889 therapeutic exercise, 97530 therapeutic activity, 97112 neuromuscular re-education, 97140 manual therapy, 97018 paraffin, 02960 fluidotherapy, 97010 moist heat, and 97034 contrast bath  RECOMMENDED OTHER SERVICES: none  CONSULTED AND AGREED WITH PLAN OF CARE: Patient  PLAN FOR NEXT SESSION: CT education   Elston Slot, M.S. OTR/L  05/08/24, 5:46 PM  ascom [phone]   Elston JINNY Slot, OT 05/08/2024, 5:46 PM   "

## 2024-05-11 ENCOUNTER — Ambulatory Visit
Admission: RE | Admit: 2024-05-11 | Discharge: 2024-05-11 | Disposition: A | Discharge status: Home / Self Care | Source: Ambulatory Visit | Attending: Family Medicine | Admitting: Family Medicine

## 2024-05-11 ENCOUNTER — Ambulatory Visit (HOSPITAL_COMMUNITY)

## 2024-05-11 VITALS — BP 129/84 | HR 70 | Temp 97.9°F | Resp 17 | Ht <= 58 in | Wt 140.0 lb

## 2024-05-11 DIAGNOSIS — J4520 Mild intermittent asthma, uncomplicated: Secondary | ICD-10-CM | POA: Diagnosis not present

## 2024-05-11 DIAGNOSIS — J329 Chronic sinusitis, unspecified: Secondary | ICD-10-CM

## 2024-05-11 DIAGNOSIS — J040 Acute laryngitis: Secondary | ICD-10-CM | POA: Diagnosis not present

## 2024-05-11 DIAGNOSIS — J40 Bronchitis, not specified as acute or chronic: Secondary | ICD-10-CM | POA: Diagnosis not present

## 2024-05-11 MED ORDER — PROMETHAZINE-DM 6.25-15 MG/5ML PO SYRP: 5.0000 mL | ORAL_SOLUTION | Freq: Three times a day (TID) | ORAL | 0 refills | Status: AC | PRN

## 2024-05-11 MED ORDER — PREDNISONE 50 MG PO TABS: 50.0000 mg | ORAL_TABLET | Freq: Every day | ORAL | 0 refills | Status: AC

## 2024-05-11 NOTE — ED Triage Notes (Signed)
 Pt states that she has a sore throat, loss of voice, coughing, chest congestion and chills. X2 days  Pt states that she has recently been treated for sinus infection.

## 2024-05-11 NOTE — Discharge Instructions (Signed)
 Please start prednisone  to help with your respiratory symptoms, laryngitis, sinobronchitis.

## 2024-05-11 NOTE — ED Provider Notes (Signed)
 " Producer, Television/film/video - [facility]  Note:  This document was prepared using Conservation officer, historic buildings and may include unintentional dictation errors.  MRN: [id number] DOB: [birth year]  Subjective:   Robyn Anderson is a 59 y.o. female presenting for 1 week history of persistent malaise, sinus congestion, chest congestion, coughing, loss of voice, throat pain, drainage. Just finished a course of amoxicillin  2 days ago for a sinus infection. Patient is a smoker, does 1/2ppd. Has a history of reactive airway.   Current Outpatient Medications  Medication Instructions   albuterol  (VENTOLIN  HFA) 108 (90 Base) MCG/ACT inhaler Inhale into the lungs.   albuterol  (VENTOLIN  HFA) 108 (90 Base) MCG/ACT inhaler 1-2 puffs, Inhalation, Every 6 hours PRN   butalbital-acetaminophen -caffeine (FIORICET) 50-325-40 MG tablet TAKE 1 TABLET BY MOUTH EVERY 4 HOURS FOR UP TO 10 DAYS AS NEEDED FOR PAIN   cetirizine  (ZYRTEC ) 10 mg, Oral, Daily   Cholecalciferol (VITAMIN D3) 1.25 MG (50000 UT) CAPS 1 capsule, Weekly   esomeprazole (NEXIUM) 40 mg, Daily   fluticasone  (FLONASE ) 50 MCG/ACT nasal spray 1 spray, Each Nare, Daily   Hyoscyamine  Sulfate SL (LEVSIN /SL) 0.125 mg, Sublingual, Every 6 hours PRN   lidocaine  (XYLOCAINE ) 2 % solution 15 mLs, Mouth/Throat, As needed   lisinopril  (ZESTRIL ) 30 MG tablet TAKE 1 TABLET(30 MG) BY MOUTH DAILY   nortriptyline (PAMELOR) 10 mg, Daily at bedtime   ondansetron  (ZOFRAN ) 4 mg, Oral, Every 8 hours PRN   ondansetron  (ZOFRAN -ODT) 4 mg, Oral, Every 8 hours PRN   pregabalin (LYRICA) 50 mg, 3 times daily   SUMAtriptan  (IMITREX ) 25 mg, Every 2 hours PRN    Allergies[1]  Past Medical History:  Diagnosis Date   Barrett esophagus    Coronary artery disease    GERD (gastroesophageal reflux disease)    Headache    migraines 1-2/mo   Hypertension    Obesity (BMI 30-39.9)    Postmenopausal bleeding 01/20/2019     Past Surgical History:  Procedure Laterality Date    ATHERECTOMY Right    renal   CARPAL TUNNEL RELEASE Left    CARPAL TUNNEL RELEASE Right 09/27/2015   Procedure: OPEN CARPAL TUNNEL RELEASE RIGHT HAND;  Surgeon: Helayne Glenn, MD;  Location: [facility];  Service: Orthopedics;  Laterality: Right;   CESAREAN SECTION     x2   COLONOSCOPY     ESOPHAGOGASTRODUODENOSCOPY     HYSTEROSCOPY WITH D & C N/A 04/18/2019   Procedure: DILATATION AND CURETTAGE /HYSTEROSCOPY;  Surgeon: Izell Harari, MD;  Location: [facility];  Service: Gynecology;  Laterality: N/A;   LAPAROSCOPIC SALPINGO OOPHERECTOMY Bilateral 04/18/2019   Procedure: LAPAROSCOPIC SALPINGO OOPHORECTOMY;  Surgeon: Izell Harari, MD;  Location: [facility];  Service: Gynecology;  Laterality: Bilateral;   TUBAL LIGATION      Family History  Problem Relation Age of Onset   Cancer Mother    Hypertension Mother    Cancer Father    Hypertension Father    Hypertension Sister    Hypertension Brother    Stroke Maternal Grandmother    Diabetes Paternal Grandmother    Epilepsy Son     Social History   Occupational History    Comment: shift work  Tobacco Use   Smoking status: Every Day    Current packs/day: 0.50    Average packs/day: 0.5 packs/day for 30.0 years (15.0 ttl pk-yrs)    Types: Cigarettes   Smokeless tobacco: Never  Vaping Use   Vaping status: Never Used  Substance  and Sexual Activity   Alcohol use: No   Drug use: No   Sexual activity: Yes    Birth control/protection: Surgical     ROS   Objective:   Vitals: BP 129/84 (BP Location: Left Arm)   Pulse 70   Temp 97.9 F (36.6 C) (Oral)   Resp 17   Ht 4' 9 (1.448 m)   Wt 140 lb (63.5 kg)   LMP 07/30/2017   SpO2 95%   BMI 30.30 kg/m   Physical Exam Constitutional:      General: She is not in acute distress.    Appearance: Normal appearance. She is well-developed. She is not ill-appearing, toxic-appearing or diaphoretic.  HENT:     Head: Normocephalic and  atraumatic.     Nose: Nose normal.     Mouth/Throat:     Pharynx: Posterior oropharyngeal erythema (with associated postnasal drainage overlying pharynx) present. No pharyngeal swelling, oropharyngeal exudate or uvula swelling.     Tonsils: No tonsillar exudate or tonsillar abscesses. 0 on the right. 0 on the left.     Comments: Hoarseness noted. Eyes:     General: No scleral icterus.       Right eye: No discharge.        Left eye: No discharge.     Extraocular Movements: Extraocular movements intact.  Cardiovascular:     Rate and Rhythm: Normal rate and regular rhythm.     Heart sounds: Normal heart sounds. No murmur heard.    No friction rub. No gallop.  Pulmonary:     Effort: Pulmonary effort is normal. No respiratory distress.     Breath sounds: No stridor. Rhonchi (trace throughout) present. No wheezing or rales.  Chest:     Chest wall: No tenderness.  Skin:    General: Skin is warm and dry.  Neurological:     General: No focal deficit present.     Mental Status: She is alert and oriented to person, place, and time.  Psychiatric:        Mood and Affect: Mood normal.        Behavior: Behavior normal.     Assessment and Plan :   PDMP not reviewed this encounter.  1. Sinobronchitis   2. Mild intermittent reactive airway disease without complication   3. Laryngitis      Patient just completed amoxicillin  and therefore recommended adding prednisone  to her regimen to address laryngitis, sinobronchitis.  Use albuterol  as needed.  Will defer imaging for now.  Counseled patient on potential for adverse effects with medications prescribed/recommended today, ER and return-to-clinic precautions discussed, patient verbalized understanding.     [1]  Allergies Allergen Reactions   Topiramate     tremors Other reaction(s): Other, Other (See Comments), Other (See Comments) Tremours per patient Tremors Other reaction(s): Other (See Comments) Tremours per  patient tremors Tremors Other reaction(s): Other (See Comments) Tremours per patient    Atorvastatin Other (See Comments)    Muscle pain (generalized) Other reaction(s): Other (See Comments), Other (See Comments) Muscle pain (generalized) Muscle pain (generalized) Muscle pain (generalized) Muscle pain (generalized) Muscle pain (generalized)    Diclofenac     Other reaction(s): Other (See Comments), Other (See Comments) lightheaded lightheaded lightheaded    Hydrocodone-Acetaminophen  Nausea And Vomiting    Other reaction(s): GI Intolerance, Vomiting Other reaction(s): GI Intolerance. Pt states she CAN take Acetaminophen . Other reaction(s): GI Intolerance. Pt states she CAN take Acetaminophen .    Vicodin [Hydrocodone-Acetaminophen ] Nausea And Vomiting     Christopher,  Bette, PA-C 05/11/24 0919  "

## 2024-05-22 ENCOUNTER — Ambulatory Visit: Admitting: Occupational Therapy

## 2024-05-29 ENCOUNTER — Ambulatory Visit: Admitting: Occupational Therapy

## 2024-06-05 ENCOUNTER — Ambulatory Visit: Admitting: Occupational Therapy

## 2024-06-12 ENCOUNTER — Ambulatory Visit: Admitting: Occupational Therapy

## 2024-06-19 ENCOUNTER — Ambulatory Visit: Admitting: Occupational Therapy
